# Patient Record
Sex: Male | Born: 1945 | Race: Black or African American | Hispanic: No | Marital: Single | Smoking: Never smoker
Health system: Southern US, Community
[De-identification: ages and names within clinical notes are randomized; demographics above are authoritative.]

## PROBLEM LIST (undated history)

## (undated) DIAGNOSIS — I482 Chronic atrial fibrillation, unspecified: Secondary | ICD-10-CM

## (undated) DIAGNOSIS — F419 Anxiety disorder, unspecified: Secondary | ICD-10-CM

## (undated) DIAGNOSIS — J9621 Acute and chronic respiratory failure with hypoxia: Secondary | ICD-10-CM

## (undated) DIAGNOSIS — J189 Pneumonia, unspecified organism: Secondary | ICD-10-CM

## (undated) DIAGNOSIS — U071 COVID-19: Secondary | ICD-10-CM

## (undated) DIAGNOSIS — F329 Major depressive disorder, single episode, unspecified: Secondary | ICD-10-CM

## (undated) DIAGNOSIS — F259 Schizoaffective disorder, unspecified: Secondary | ICD-10-CM

## (undated) DIAGNOSIS — N189 Chronic kidney disease, unspecified: Secondary | ICD-10-CM

## (undated) DIAGNOSIS — F32A Depression, unspecified: Secondary | ICD-10-CM

## (undated) DIAGNOSIS — R0602 Shortness of breath: Secondary | ICD-10-CM

## (undated) DIAGNOSIS — N281 Cyst of kidney, acquired: Secondary | ICD-10-CM

## (undated) DIAGNOSIS — E039 Hypothyroidism, unspecified: Secondary | ICD-10-CM

## (undated) DIAGNOSIS — F250 Schizoaffective disorder, bipolar type: Secondary | ICD-10-CM

## (undated) DIAGNOSIS — M1990 Unspecified osteoarthritis, unspecified site: Secondary | ICD-10-CM

## (undated) DIAGNOSIS — G2401 Drug induced subacute dyskinesia: Secondary | ICD-10-CM

## (undated) DIAGNOSIS — R6520 Severe sepsis without septic shock: Secondary | ICD-10-CM

## (undated) DIAGNOSIS — I509 Heart failure, unspecified: Secondary | ICD-10-CM

## (undated) DIAGNOSIS — I10 Essential (primary) hypertension: Secondary | ICD-10-CM

## (undated) DIAGNOSIS — A419 Sepsis, unspecified organism: Secondary | ICD-10-CM

## (undated) DIAGNOSIS — R569 Unspecified convulsions: Secondary | ICD-10-CM

## (undated) DIAGNOSIS — G9341 Metabolic encephalopathy: Secondary | ICD-10-CM

## (undated) DIAGNOSIS — F99 Mental disorder, not otherwise specified: Secondary | ICD-10-CM

## (undated) DIAGNOSIS — E119 Type 2 diabetes mellitus without complications: Secondary | ICD-10-CM

## (undated) HISTORY — PX: HERNIA REPAIR: SHX51

## (undated) HISTORY — PX: WRIST ARTHROPLASTY: SHX1088

## (undated) HISTORY — PX: ANKLE ARTHROSCOPY: SUR85

## (undated) HISTORY — PX: COLONOSCOPY: SHX174

---

## 2007-08-31 ENCOUNTER — Emergency Department (HOSPITAL_COMMUNITY): Admission: EM | Admit: 2007-08-31 | Discharge: 2007-08-31 | Payer: Self-pay | Admitting: Emergency Medicine

## 2007-09-01 ENCOUNTER — Emergency Department (HOSPITAL_COMMUNITY): Admission: EM | Admit: 2007-09-01 | Discharge: 2007-09-02 | Payer: Self-pay | Admitting: Emergency Medicine

## 2010-11-04 LAB — COMPREHENSIVE METABOLIC PANEL
AST: 25
Alkaline Phosphatase: 104
BUN: 14
Creatinine, Ser: 1.71 — ABNORMAL HIGH
GFR calc Af Amer: 49 — ABNORMAL LOW
GFR calc non Af Amer: 41 — ABNORMAL LOW
Glucose, Bld: 117 — ABNORMAL HIGH
Sodium: 141
Total Bilirubin: 1.1

## 2010-11-04 LAB — CBC
MCHC: 34.1
Platelets: 201
RDW: 14.6

## 2010-11-04 LAB — DIFFERENTIAL
Basophils Absolute: 0
Basophils Relative: 1
Monocytes Absolute: 0.3
Neutro Abs: 3.3
Neutrophils Relative %: 57

## 2010-11-04 LAB — RAPID URINE DRUG SCREEN, HOSP PERFORMED
Amphetamines: NOT DETECTED
Barbiturates: NOT DETECTED
Benzodiazepines: NOT DETECTED
Opiates: NOT DETECTED
Tetrahydrocannabinol: NOT DETECTED

## 2010-11-04 LAB — GLUCOSE, CAPILLARY: Glucose-Capillary: 75

## 2010-11-04 LAB — ETHANOL: Alcohol, Ethyl (B): <5

## 2010-11-04 LAB — TSH: TSH: 0.886

## 2012-06-20 HISTORY — PX: TRANSTHORACIC ECHOCARDIOGRAM: SHX275

## 2012-08-06 HISTORY — PX: COLON RESECTION: SHX5231

## 2012-10-03 ENCOUNTER — Other Ambulatory Visit: Payer: Self-pay | Admitting: Neurosurgery

## 2012-10-22 NOTE — Pre-Procedure Instructions (Addendum)
Henry Mcbride  10/22/2012   Your procedure is scheduled on: Sept 22 at 0930  Report to [facility] Stay Center at 0730 AM.  Call this number if you have problems the morning of surgery: [phone]   Remember:   Do not eat food or drink liquids after midnight.   Take these medicines the morning of surgery with A SIP OF WATER: Coreg, Avodart, Synthroid, Ditropan,  Flomax.  Stop taking Aspirin, Aleve, BC's, Goody's, Ibuprofen, Herbal medications, and Fish Oil.   Do not wear jewelry, make-up or nail polish.  Do not wear lotions, powders, or perfumes. You may wear deodorant.  Do not shave 48 hours prior to surgery. Men may shave face and neck.  Do not bring valuables to the hospital.  [facility] is not responsible                   for any belongings or valuables.  Contacts, dentures or bridgework may not be worn into surgery.  Leave suitcase in the car. After surgery it may be brought to your room.  For patients admitted to the hospital, checkout time is 11:00 AM the day of  discharge.   Patients discharged the day of surgery will not be allowed to drive  home.    Special Instructions: Shower using CHG 2 nights before surgery and the night before surgery.  If you shower the day of surgery use CHG.  Use special wash - you have one bottle of CHG for all showers.  You should use approximately 1/3 of the bottle for each shower.   Please read over the following fact sheets that you were given: Pain Booklet, Coughing and Deep Breathing, Blood Transfusion Information, MRSA Information and Surgical Site Infection Prevention

## 2012-10-23 ENCOUNTER — Telehealth (INDEPENDENT_AMBULATORY_CARE_PROVIDER_SITE_OTHER): Payer: Self-pay | Admitting: General Surgery

## 2012-10-23 ENCOUNTER — Encounter (HOSPITAL_COMMUNITY)
Admission: RE | Admit: 2012-10-23 | Discharge: 2012-10-23 | Disposition: A | Discharge status: Home / Self Care | Payer: Medicare Other | Source: Ambulatory Visit | Attending: Neurosurgery | Admitting: Neurosurgery

## 2012-10-23 ENCOUNTER — Encounter (HOSPITAL_COMMUNITY): Payer: Self-pay | Admitting: Pharmacy Technician

## 2012-10-23 ENCOUNTER — Encounter (HOSPITAL_COMMUNITY): Payer: Self-pay

## 2012-10-23 DIAGNOSIS — Z01812 Encounter for preprocedural laboratory examination: Secondary | ICD-10-CM | POA: Insufficient documentation

## 2012-10-23 DIAGNOSIS — Z01818 Encounter for other preprocedural examination: Secondary | ICD-10-CM | POA: Insufficient documentation

## 2012-10-23 HISTORY — DX: Heart failure, unspecified: I50.9

## 2012-10-23 HISTORY — DX: Major depressive disorder, single episode, unspecified: F32.9

## 2012-10-23 HISTORY — DX: Mental disorder, not otherwise specified: F99

## 2012-10-23 HISTORY — DX: Essential (primary) hypertension: I10

## 2012-10-23 HISTORY — DX: Cyst of kidney, acquired: N28.1

## 2012-10-23 HISTORY — DX: Hypothyroidism, unspecified: E03.9

## 2012-10-23 HISTORY — DX: Schizoaffective disorder, bipolar type: F25.0

## 2012-10-23 HISTORY — DX: Unspecified convulsions: R56.9

## 2012-10-23 HISTORY — DX: Depression, unspecified: F32.A

## 2012-10-23 HISTORY — DX: Shortness of breath: R06.02

## 2012-10-23 HISTORY — DX: Anxiety disorder, unspecified: F41.9

## 2012-10-23 HISTORY — DX: Chronic kidney disease, unspecified: N18.9

## 2012-10-23 HISTORY — DX: Unspecified osteoarthritis, unspecified site: M19.90

## 2012-10-23 HISTORY — DX: Drug induced subacute dyskinesia: G24.01

## 2012-10-23 HISTORY — DX: Type 2 diabetes mellitus without complications: E11.9

## 2012-10-23 HISTORY — DX: Schizoaffective disorder, unspecified: F25.9

## 2012-10-23 LAB — BASIC METABOLIC PANEL WITH GFR
BUN: 24 mg/dL — ABNORMAL HIGH (ref 6–23)
CO2: 28 meq/L (ref 19–32)
Calcium: 9.6 mg/dL (ref 8.4–10.5)
Chloride: 100 meq/L (ref 96–112)
Creatinine, Ser: 1.71 mg/dL — ABNORMAL HIGH (ref 0.50–1.35)
GFR calc Af Amer: 46 mL/min — ABNORMAL LOW
GFR calc non Af Amer: 40 mL/min — ABNORMAL LOW
Glucose, Bld: 81 mg/dL (ref 70–99)
Potassium: 3.9 meq/L (ref 3.5–5.1)
Sodium: 137 meq/L (ref 135–145)

## 2012-10-23 LAB — CBC WITH DIFFERENTIAL/PLATELET
Basophils Relative: 0 % (ref 0–1)
Eosinophils Relative: 3 % (ref 0–5)
HCT: 38.7 % — ABNORMAL LOW (ref 39.0–52.0)
Hemoglobin: 12.9 g/dL — ABNORMAL LOW (ref 13.0–17.0)
Lymphocytes Relative: 47 % — ABNORMAL HIGH (ref 12–46)
MCHC: 33.3 g/dL (ref 30.0–36.0)
MCV: 96.3 fL (ref 78.0–100.0)
Monocytes Absolute: 0.6 10*3/uL (ref 0.1–1.0)
Monocytes Relative: 9 % (ref 3–12)
Neutro Abs: 2.5 10*3/uL (ref 1.7–7.7)

## 2012-10-23 LAB — TYPE AND SCREEN
ABO/RH(D): A POS
Antibody Screen: NEGATIVE

## 2012-10-23 LAB — SURGICAL PCR SCREEN
MRSA, PCR: POSITIVE — AB
Staphylococcus aureus: POSITIVE — AB

## 2012-10-23 NOTE — Progress Notes (Signed)
Pt from [facility].  He would like to return there after surgery, if able. Group home  Rep states that he can not be d/c on Sat. or Sun.   Pt states that he was in the [facility] in July 2014, and had a chest xray and ekg there. Request faxed.  Denies seeing a Cardiologist.  Unsure if he has had a card cath, Stress test, or Echo, but if he has it would have been at [facility].  Fax sent to request these test too.

## 2012-10-23 NOTE — Progress Notes (Signed)
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 Message left for Misty at [facility] group home to  Call me back.

## 2012-10-23 NOTE — Progress Notes (Signed)
Spoke with Misty at [facility] and informed her of the results of the pcr screen and that Dr Jordan Likes would be calling in a script for Mupirocin.  Voices understanding.

## 2012-10-23 NOTE — Progress Notes (Signed)
Received reports from [facility] chest xray was from 2013, and No EKG noted.  New order place for cxr and EKG the day of surgery. Echo in chart from 01-08-11.

## 2012-10-23 NOTE — Telephone Encounter (Signed)
Pt called stating his percocet wasn't controlling his pain.  He felt like tramadol worked better and had a few left.  i told him it was ok to take tramadol instead on percocet.  He will call the office in the am, if he is still having trouble with pain control.

## 2012-10-24 NOTE — Progress Notes (Addendum)
Anesthesia chart review: Patient is a 67 year old male scheduled for C2-3, C3-4, C4-5, C5-6, C6-7 posterior cervical fusion on 10/28/2012 by Dr. Jordan Likes. History includes nonsmoker, hypertension, hypothyroidism, anxiety, schizoaffective schizophrenia, depression, CHF, diabetes mellitus type 2, seizures, arthritis, renal cysts, CKD, seizures (last 3-4 years ago), tardive dyskinesia, laparoscopic right colon resection for cecal mass and umbilical hernia repair 08/07/12 (Dr. Dimas Chyle, [facility]). He lives at [facility]. PCP is Dr. Reola Calkins, NP with [facility] in [place].  He reported to his PAT RN that he had a CXR and EKG at [facility]) during his admission there for colon resection. However, [facility] did not have a record of an EKG and CXR was done in 08/2011, so these will need to be done on the day of surgery.  His prior EKG at [facility] on 08/31/07 showed NSR with inferolateral T wave abnormality, consider ischemia.    Echo on 01/08/11 ([facility]; done in the setting of [facility]) showed left ventricular dysfunction, mild with ejection fraction calculated 48% but visually estimated 40-45% with akinesia in the basilar and mid inferior lateral wall, diastolic dysfunction with impaired relaxation and normal diastolic pressure, mild aortic, mitral, and tricuspid regurgitation.  Preoperative labs noted.  Cr 1.71 which is unchanged since 08/31/07.  He has a history of an abnormal EKG and echo.  He denied seeing a cardiologist. He did tolerated colon resection just two months ago.  I have requested additional records from [facility] and will review once available.  Velna Ochs [facility] Stay [facility]/Anesthesiology Phone [phone] 10/24/2012 4:49 PM  Addendum: 10/25/2012 3:30 PM Received records from Dr. Ardelle Park that indicated that patient had recent cardiology evaluation at [facility]) in [place].  Cardiology records received from 07/10/12.  He was  seen there for clearance prior to his colon resection.  EKG 07/10/12 showed SR, lateral ST/T wave abnormality (negative T wave).  Records indicate that he had a previous work-up at [facility] for non-ischemic dilated cardiomyopathy with his EF improving to 45% and had been stable.    Echo on 06/20/12 [facility]) showed: Moderately dilated left ventricle, mild to moderate segmental systolic dysfunction with akinesia of the basilar and mid inferolateral segments. EF 45% (visually estimated 40%). Abnormal diastolic function with impaired relaxation. Mildly dilated left atrium. Trivial aortic regurgitation. Trivial mitral regurgitation.    Cardiac cath on 10/04/07 [facility]) showed EF 33%, severe anterior, apex, and inferior hypokinesis, 20% D2, 15% mid RCA, no LM, LAD, or CX stenosis. Low filling pressures.  Cardiac output is 5.34 L/min and CI is 2.7 L/min/m2.  Patient had cardiology evaluation earlier this year and has since tolerated a colon resection.  If no acute changes then I would anticipate that he could proceed as planned.

## 2012-10-25 ENCOUNTER — Encounter (HOSPITAL_COMMUNITY): Payer: Self-pay

## 2012-10-27 MED ORDER — CEFAZOLIN SODIUM-DEXTROSE 2-3 GM-% IV SOLR
2.0000 g | INTRAVENOUS | Status: AC
  Administered 2012-10-28: 2 g via INTRAVENOUS
  Filled 2012-10-27: qty 50

## 2012-10-28 ENCOUNTER — Inpatient Hospital Stay (HOSPITAL_COMMUNITY): Payer: Medicare Other

## 2012-10-28 ENCOUNTER — Encounter (HOSPITAL_COMMUNITY): Payer: Self-pay | Admitting: Vascular Surgery

## 2012-10-28 ENCOUNTER — Inpatient Hospital Stay (HOSPITAL_COMMUNITY)
Admission: RE | Admit: 2012-10-28 | Discharge: 2012-11-01 | DRG: 472 | Disposition: A | Discharge status: Home Health | Payer: Medicare Other | Source: Ambulatory Visit | Attending: Neurosurgery | Admitting: Neurosurgery

## 2012-10-28 ENCOUNTER — Encounter (HOSPITAL_COMMUNITY)
Admission: RE | Disposition: A | Discharge status: Home Health | Payer: Self-pay | Source: Ambulatory Visit | Attending: Neurosurgery

## 2012-10-28 ENCOUNTER — Encounter (HOSPITAL_COMMUNITY): Payer: Self-pay | Admitting: *Deleted

## 2012-10-28 ENCOUNTER — Inpatient Hospital Stay (HOSPITAL_COMMUNITY): Payer: Medicare Other | Admitting: Anesthesiology

## 2012-10-28 DIAGNOSIS — F3289 Other specified depressive episodes: Secondary | ICD-10-CM | POA: Diagnosis present

## 2012-10-28 DIAGNOSIS — N189 Chronic kidney disease, unspecified: Secondary | ICD-10-CM | POA: Diagnosis present

## 2012-10-28 DIAGNOSIS — M4712 Other spondylosis with myelopathy, cervical region: Principal | ICD-10-CM | POA: Diagnosis present

## 2012-10-28 DIAGNOSIS — M40209 Unspecified kyphosis, site unspecified: Secondary | ICD-10-CM | POA: Diagnosis present

## 2012-10-28 DIAGNOSIS — E119 Type 2 diabetes mellitus without complications: Secondary | ICD-10-CM | POA: Diagnosis present

## 2012-10-28 DIAGNOSIS — I129 Hypertensive chronic kidney disease with stage 1 through stage 4 chronic kidney disease, or unspecified chronic kidney disease: Secondary | ICD-10-CM | POA: Diagnosis present

## 2012-10-28 DIAGNOSIS — M129 Arthropathy, unspecified: Secondary | ICD-10-CM | POA: Diagnosis present

## 2012-10-28 DIAGNOSIS — E039 Hypothyroidism, unspecified: Secondary | ICD-10-CM | POA: Diagnosis present

## 2012-10-28 DIAGNOSIS — F411 Generalized anxiety disorder: Secondary | ICD-10-CM | POA: Diagnosis present

## 2012-10-28 DIAGNOSIS — M4000 Postural kyphosis, site unspecified: Secondary | ICD-10-CM | POA: Diagnosis present

## 2012-10-28 DIAGNOSIS — M4802 Spinal stenosis, cervical region: Secondary | ICD-10-CM

## 2012-10-28 DIAGNOSIS — F259 Schizoaffective disorder, unspecified: Secondary | ICD-10-CM | POA: Diagnosis present

## 2012-10-28 DIAGNOSIS — F329 Major depressive disorder, single episode, unspecified: Secondary | ICD-10-CM | POA: Diagnosis present

## 2012-10-28 DIAGNOSIS — I428 Other cardiomyopathies: Secondary | ICD-10-CM | POA: Diagnosis present

## 2012-10-28 HISTORY — PX: POSTERIOR CERVICAL FUSION/FORAMINOTOMY: SHX5038

## 2012-10-28 LAB — GLUCOSE, CAPILLARY
Glucose-Capillary: 88 mg/dL (ref 70–99)
Glucose-Capillary: 148 mg/dL — ABNORMAL HIGH (ref 70–99)
Glucose-Capillary: 144 mg/dL — ABNORMAL HIGH (ref 70–99)

## 2012-10-28 SURGERY — POSTERIOR CERVICAL FUSION/FORAMINOTOMY LEVEL 5
Anesthesia: General | Site: Neck | Wound class: Clean

## 2012-10-28 MED ORDER — HEMOSTATIC AGENTS (NO CHARGE) OPTIME
TOPICAL | Status: DC | PRN
  Administered 2012-10-28: 1 via TOPICAL

## 2012-10-28 MED ORDER — DUTASTERIDE 0.5 MG PO CAPS
0.5000 mg | ORAL_CAPSULE | Freq: Every day | ORAL | Status: DC
  Administered 2012-10-29 – 2012-11-01 (×4): 0.5 mg via ORAL
  Filled 2012-10-28 (×4): qty 1

## 2012-10-28 MED ORDER — POLYETHYLENE GLYCOL 3350 17 GM/SCOOP PO POWD
17.0000 g | Freq: Every day | ORAL | Status: DC
  Administered 2012-10-28: 17 g via ORAL
  Filled 2012-10-28 (×2): qty 255

## 2012-10-28 MED ORDER — PHENYLEPHRINE HCL 10 MG/ML IJ SOLN
INTRAMUSCULAR | Status: DC | PRN
  Administered 2012-10-28: 80 ug via INTRAVENOUS
  Administered 2012-10-28: 160 ug via INTRAVENOUS

## 2012-10-28 MED ORDER — ARTIFICIAL TEARS OP OINT
TOPICAL_OINTMENT | OPHTHALMIC | Status: DC | PRN
  Administered 2012-10-28: 1 via OPHTHALMIC

## 2012-10-28 MED ORDER — HYDROCODONE-ACETAMINOPHEN 5-325 MG PO TABS
1.0000 | ORAL_TABLET | ORAL | Status: DC | PRN
  Administered 2012-10-30 – 2012-11-01 (×2): 2 via ORAL
  Filled 2012-10-28 (×2): qty 2

## 2012-10-28 MED ORDER — ONDANSETRON HCL 4 MG/2ML IJ SOLN
INTRAMUSCULAR | Status: DC | PRN
  Administered 2012-10-28: 4 mg via INTRAVENOUS

## 2012-10-28 MED ORDER — FENTANYL CITRATE 0.05 MG/ML IJ SOLN
INTRAMUSCULAR | Status: DC | PRN
  Administered 2012-10-28: 100 ug via INTRAVENOUS

## 2012-10-28 MED ORDER — LACTATED RINGERS IV SOLN
INTRAVENOUS | Status: DC | PRN
  Administered 2012-10-28: 09:00:00 via INTRAVENOUS

## 2012-10-28 MED ORDER — ROCURONIUM BROMIDE 100 MG/10ML IV SOLN
INTRAVENOUS | Status: DC | PRN
  Administered 2012-10-28: 50 mg via INTRAVENOUS

## 2012-10-28 MED ORDER — 0.9 % SODIUM CHLORIDE (POUR BTL) OPTIME
TOPICAL | Status: DC | PRN
  Administered 2012-10-28: 1000 mL

## 2012-10-28 MED ORDER — NEOSTIGMINE METHYLSULFATE 1 MG/ML IJ SOLN
INTRAMUSCULAR | Status: DC | PRN
  Administered 2012-10-28: 4 mg via INTRAVENOUS
  Administered 2012-10-28: 1 mg via INTRAVENOUS

## 2012-10-28 MED ORDER — SODIUM CHLORIDE 0.9 % IV SOLN
250.0000 mL | INTRAVENOUS | Status: DC
  Administered 2012-10-28: 250 mL via INTRAVENOUS

## 2012-10-28 MED ORDER — PROPOFOL 10 MG/ML IV BOLUS
INTRAVENOUS | Status: DC | PRN
  Administered 2012-10-28: 25 mg via INTRAVENOUS
  Administered 2012-10-28: 175 mg via INTRAVENOUS

## 2012-10-28 MED ORDER — LEVOTHYROXINE SODIUM 137 MCG PO TABS
137.0000 ug | ORAL_TABLET | Freq: Every day | ORAL | Status: DC
  Administered 2012-10-29 – 2012-11-01 (×4): 137 ug via ORAL
  Filled 2012-10-28 (×5): qty 1

## 2012-10-28 MED ORDER — PHENOL 1.4 % MT LIQD: 1.0000 | OROMUCOSAL | Status: DC | PRN

## 2012-10-28 MED ORDER — SODIUM CHLORIDE 0.9 % IJ SOLN
3.0000 mL | INTRAMUSCULAR | Status: DC | PRN
  Administered 2012-10-30: 3 mL via INTRAVENOUS

## 2012-10-28 MED ORDER — FUROSEMIDE 20 MG PO TABS
20.0000 mg | ORAL_TABLET | Freq: Every day | ORAL | Status: DC
  Administered 2012-10-28 – 2012-11-01 (×5): 20 mg via ORAL
  Filled 2012-10-28 (×5): qty 1

## 2012-10-28 MED ORDER — TAMSULOSIN HCL 0.4 MG PO CAPS
0.4000 mg | ORAL_CAPSULE | Freq: Two times a day (BID) | ORAL | Status: DC
  Administered 2012-10-28 – 2012-11-01 (×8): 0.4 mg via ORAL
  Filled 2012-10-28 (×9): qty 1

## 2012-10-28 MED ORDER — BUPIVACAINE HCL (PF) 0.25 % IJ SOLN
INTRAMUSCULAR | Status: DC | PRN
  Administered 2012-10-28: 10 mL

## 2012-10-28 MED ORDER — SENNA 8.6 MG PO TABS
1.0000 | ORAL_TABLET | Freq: Two times a day (BID) | ORAL | Status: DC
  Administered 2012-10-28 – 2012-11-01 (×8): 8.6 mg via ORAL
  Filled 2012-10-28 (×9): qty 1

## 2012-10-28 MED ORDER — LACTATED RINGERS IV SOLN: Freq: Once | INTRAVENOUS | Status: DC

## 2012-10-28 MED ORDER — VECURONIUM BROMIDE 10 MG IV SOLR
INTRAVENOUS | Status: DC | PRN
  Administered 2012-10-28: 3 mg via INTRAVENOUS
  Administered 2012-10-28: 2 mg via INTRAVENOUS
  Administered 2012-10-28: 3 mg via INTRAVENOUS

## 2012-10-28 MED ORDER — POTASSIUM CHLORIDE 20 MEQ/15ML (10%) PO LIQD
10.0000 meq | Freq: Every day | ORAL | Status: DC
  Administered 2012-10-28 – 2012-11-01 (×5): 10 meq via ORAL
  Filled 2012-10-28 (×6): qty 7.5

## 2012-10-28 MED ORDER — EPHEDRINE SULFATE 50 MG/ML IJ SOLN
INTRAMUSCULAR | Status: DC | PRN
  Administered 2012-10-28 (×2): 10 mg via INTRAVENOUS
  Administered 2012-10-28: 15 mg via INTRAVENOUS

## 2012-10-28 MED ORDER — SODIUM CHLORIDE 0.9 % IV SOLN
10.0000 mg | INTRAVENOUS | Status: DC | PRN
  Administered 2012-10-28: 10 ug/min via INTRAVENOUS

## 2012-10-28 MED ORDER — LINAGLIPTIN 5 MG PO TABS
5.0000 mg | ORAL_TABLET | Freq: Every day | ORAL | Status: DC
  Administered 2012-10-28 – 2012-11-01 (×5): 5 mg via ORAL
  Filled 2012-10-28 (×5): qty 1

## 2012-10-28 MED ORDER — HYDROMORPHONE HCL PF 1 MG/ML IJ SOLN
INTRAMUSCULAR | Status: AC
  Filled 2012-10-28: qty 1

## 2012-10-28 MED ORDER — ONDANSETRON HCL 4 MG/2ML IJ SOLN: 4.0000 mg | INTRAMUSCULAR | Status: DC | PRN

## 2012-10-28 MED ORDER — LIDOCAINE HCL (CARDIAC) 20 MG/ML IV SOLN
INTRAVENOUS | Status: DC | PRN
  Administered 2012-10-28: 100 mg via INTRAVENOUS

## 2012-10-28 MED ORDER — THROMBIN 20000 UNITS EX SOLR
CUTANEOUS | Status: DC | PRN
  Administered 2012-10-28: 11:00:00 via TOPICAL

## 2012-10-28 MED ORDER — MENTHOL 3 MG MT LOZG: 1.0000 | LOZENGE | OROMUCOSAL | Status: DC | PRN

## 2012-10-28 MED ORDER — ONDANSETRON HCL 4 MG/2ML IJ SOLN: 4.0000 mg | Freq: Four times a day (QID) | INTRAMUSCULAR | Status: DC | PRN

## 2012-10-28 MED ORDER — MIDAZOLAM HCL 5 MG/5ML IJ SOLN
INTRAMUSCULAR | Status: DC | PRN
  Administered 2012-10-28: 2 mg via INTRAVENOUS

## 2012-10-28 MED ORDER — POTASSIUM CHLORIDE 40 MEQ/15ML (20%) PO LIQD: 10.0000 meq | Freq: Every day | ORAL | Status: DC

## 2012-10-28 MED ORDER — CYCLOBENZAPRINE HCL 10 MG PO TABS
10.0000 mg | ORAL_TABLET | Freq: Three times a day (TID) | ORAL | Status: DC | PRN
  Administered 2012-10-28 – 2012-10-31 (×3): 10 mg via ORAL
  Filled 2012-10-28 (×3): qty 1

## 2012-10-28 MED ORDER — SUCCINYLCHOLINE CHLORIDE 20 MG/ML IJ SOLN
INTRAMUSCULAR | Status: DC | PRN
  Administered 2012-10-28: 120 mg via INTRAVENOUS

## 2012-10-28 MED ORDER — ACETAMINOPHEN 650 MG RE SUPP: 650.0000 mg | RECTAL | Status: DC | PRN

## 2012-10-28 MED ORDER — ALUM & MAG HYDROXIDE-SIMETH 200-200-20 MG/5ML PO SUSP: 30.0000 mL | Freq: Four times a day (QID) | ORAL | Status: DC | PRN

## 2012-10-28 MED ORDER — OXYCODONE HCL 5 MG PO TABS: 5.0000 mg | ORAL_TABLET | Freq: Once | ORAL | Status: DC | PRN

## 2012-10-28 MED ORDER — DONEPEZIL HCL 10 MG PO TABS
10.0000 mg | ORAL_TABLET | Freq: Every day | ORAL | Status: DC
  Administered 2012-10-28 – 2012-10-31 (×4): 10 mg via ORAL
  Filled 2012-10-28 (×5): qty 1

## 2012-10-28 MED ORDER — OLANZAPINE 10 MG PO TBDP
10.0000 mg | ORAL_TABLET | Freq: Every day | ORAL | Status: DC
  Administered 2012-10-28 – 2012-10-31 (×4): 10 mg via ORAL
  Filled 2012-10-28 (×6): qty 1

## 2012-10-28 MED ORDER — OXYCODONE-ACETAMINOPHEN 5-325 MG PO TABS
1.0000 | ORAL_TABLET | ORAL | Status: DC | PRN
  Administered 2012-10-28 – 2012-10-31 (×8): 2 via ORAL
  Filled 2012-10-28 (×8): qty 2

## 2012-10-28 MED ORDER — OXYCODONE HCL 5 MG/5ML PO SOLN: 5.0000 mg | Freq: Once | ORAL | Status: DC | PRN

## 2012-10-28 MED ORDER — SODIUM CHLORIDE 0.9 % IJ SOLN
3.0000 mL | Freq: Two times a day (BID) | INTRAMUSCULAR | Status: DC
  Administered 2012-10-28 – 2012-11-01 (×8): 3 mL via INTRAVENOUS

## 2012-10-28 MED ORDER — HYDROMORPHONE HCL PF 1 MG/ML IJ SOLN
0.5000 mg | INTRAMUSCULAR | Status: DC | PRN
  Administered 2012-10-28 – 2012-10-29 (×4): 1 mg via INTRAVENOUS
  Filled 2012-10-28 (×4): qty 1

## 2012-10-28 MED ORDER — BENZTROPINE MESYLATE 2 MG PO TABS
2.0000 mg | ORAL_TABLET | Freq: Two times a day (BID) | ORAL | Status: DC
  Administered 2012-10-28 – 2012-11-01 (×8): 2 mg via ORAL
  Filled 2012-10-28 (×9): qty 1

## 2012-10-28 MED ORDER — DEXAMETHASONE SODIUM PHOSPHATE 10 MG/ML IJ SOLN
10.0000 mg | INTRAMUSCULAR | Status: AC
  Administered 2012-10-28: 10 mg via INTRAVENOUS
  Filled 2012-10-28: qty 1

## 2012-10-28 MED ORDER — GLYCOPYRROLATE 0.2 MG/ML IJ SOLN
INTRAMUSCULAR | Status: DC | PRN
  Administered 2012-10-28: 0.2 mg via INTRAVENOUS
  Administered 2012-10-28: .8 mg via INTRAVENOUS

## 2012-10-28 MED ORDER — ALBUMIN HUMAN 5 % IV SOLN
INTRAVENOUS | Status: DC | PRN
  Administered 2012-10-28: 12:00:00 via INTRAVENOUS

## 2012-10-28 MED ORDER — OXYBUTYNIN CHLORIDE 5 MG PO TABS
5.0000 mg | ORAL_TABLET | Freq: Two times a day (BID) | ORAL | Status: DC
  Administered 2012-10-28 – 2012-11-01 (×8): 5 mg via ORAL
  Filled 2012-10-28 (×10): qty 1

## 2012-10-28 MED ORDER — HYDROMORPHONE HCL PF 1 MG/ML IJ SOLN
0.2500 mg | INTRAMUSCULAR | Status: DC | PRN
  Administered 2012-10-28 (×4): 0.5 mg via INTRAVENOUS

## 2012-10-28 MED ORDER — ACETAMINOPHEN 325 MG PO TABS: 650.0000 mg | ORAL_TABLET | ORAL | Status: DC | PRN

## 2012-10-28 MED ORDER — DIVALPROEX SODIUM ER 500 MG PO TB24
1750.0000 mg | ORAL_TABLET | Freq: Every evening | ORAL | Status: DC
  Administered 2012-10-28 – 2012-10-31 (×4): 1750 mg via ORAL
  Filled 2012-10-28 (×5): qty 1

## 2012-10-28 MED ORDER — CARVEDILOL 6.25 MG PO TABS
6.2500 mg | ORAL_TABLET | Freq: Two times a day (BID) | ORAL | Status: DC
  Administered 2012-10-28 – 2012-11-01 (×8): 6.25 mg via ORAL
  Filled 2012-10-28 (×11): qty 1

## 2012-10-28 MED ORDER — BACITRACIN ZINC 500 UNIT/GM EX OINT
TOPICAL_OINTMENT | CUTANEOUS | Status: DC | PRN
  Administered 2012-10-28: 1 via TOPICAL

## 2012-10-28 MED ORDER — INSULIN ASPART 100 UNIT/ML ~~LOC~~ SOLN
0.0000 [IU] | Freq: Three times a day (TID) | SUBCUTANEOUS | Status: DC
  Administered 2012-10-28 – 2012-10-29 (×3): 2 [IU] via SUBCUTANEOUS
  Administered 2012-10-30: 3 [IU] via SUBCUTANEOUS
  Administered 2012-10-30: 2 [IU] via SUBCUTANEOUS
  Administered 2012-10-30 – 2012-10-31 (×3): 3 [IU] via SUBCUTANEOUS

## 2012-10-28 MED ORDER — CEFAZOLIN SODIUM 1-5 GM-% IV SOLN
1.0000 g | Freq: Three times a day (TID) | INTRAVENOUS | Status: AC
  Administered 2012-10-28 – 2012-10-29 (×2): 1 g via INTRAVENOUS
  Filled 2012-10-28 (×2): qty 50

## 2012-10-28 MED ORDER — THROMBIN 5000 UNITS EX SOLR
CUTANEOUS | Status: DC | PRN
  Administered 2012-10-28 (×2): 5000 [IU] via TOPICAL

## 2012-10-28 SURGICAL SUPPLY — 66 items
BAG DECANTER FOR FLEXI CONT (MISCELLANEOUS) ×2 IMPLANT
BENZOIN TINCTURE PRP APPL 2/3 (GAUZE/BANDAGES/DRESSINGS) ×2 IMPLANT
BIT DRILL 2.4X (BIT) ×1 IMPLANT
BIT DRL 2.4X (BIT) ×1
BLADE SURG ROTATE 9660 (MISCELLANEOUS) IMPLANT
BRUSH SCRUB EZ PLAIN DRY (MISCELLANEOUS) ×2 IMPLANT
BUR MATCHSTICK NEURO 3.0 LAGG (BURR) ×4 IMPLANT
CANISTER SUCTION 2500CC (MISCELLANEOUS) ×2 IMPLANT
CLOTH BEACON ORANGE TIMEOUT ST (SAFETY) ×2 IMPLANT
CONT SPEC 4OZ CLIKSEAL STRL BL (MISCELLANEOUS) ×4 IMPLANT
DERMABOND ADHESIVE PROPEN (GAUZE/BANDAGES/DRESSINGS) ×1
DERMABOND ADVANCED (GAUZE/BANDAGES/DRESSINGS) ×1
DERMABOND ADVANCED .7 DNX12 (GAUZE/BANDAGES/DRESSINGS) ×1 IMPLANT
DERMABOND ADVANCED .7 DNX6 (GAUZE/BANDAGES/DRESSINGS) ×1 IMPLANT
DRAPE C-ARM 42X72 X-RAY (DRAPES) ×4 IMPLANT
DRAPE LAPAROTOMY 100X72 PEDS (DRAPES) ×2 IMPLANT
DRAPE POUCH INSTRU U-SHP 10X18 (DRAPES) ×2 IMPLANT
DRILL BIT (BIT) ×1
DRSG OPSITE POSTOP 4X10 (GAUZE/BANDAGES/DRESSINGS) ×2 IMPLANT
DURAPREP 26ML APPLICATOR (WOUND CARE) ×2 IMPLANT
ELECT REM PT RETURN 9FT ADLT (ELECTROSURGICAL) ×2
ELECTRODE REM PT RTRN 9FT ADLT (ELECTROSURGICAL) ×1 IMPLANT
EVACUATOR 1/8 PVC DRAIN (DRAIN) IMPLANT
GAUZE SPONGE 4X4 16PLY XRAY LF (GAUZE/BANDAGES/DRESSINGS) IMPLANT
GLOVE BIO SURGEON STRL SZ8 (GLOVE) ×2 IMPLANT
GLOVE BIOGEL PI IND STRL 7.5 (GLOVE) ×1 IMPLANT
GLOVE BIOGEL PI INDICATOR 7.5 (GLOVE) ×1
GLOVE ECLIPSE 8.5 STRL (GLOVE) ×4 IMPLANT
GLOVE EXAM NITRILE LRG STRL (GLOVE) IMPLANT
GLOVE EXAM NITRILE MD LF STRL (GLOVE) IMPLANT
GLOVE EXAM NITRILE XL STR (GLOVE) ×2 IMPLANT
GLOVE EXAM NITRILE XS STR PU (GLOVE) IMPLANT
GLOVE INDICATOR 8.5 STRL (GLOVE) ×6 IMPLANT
GLOVE SURG SS PI 7.0 STRL IVOR (GLOVE) ×10 IMPLANT
GOWN BRE IMP SLV AUR LG STRL (GOWN DISPOSABLE) IMPLANT
GOWN BRE IMP SLV AUR XL STRL (GOWN DISPOSABLE) ×6 IMPLANT
GOWN STRL REIN 2XL LVL4 (GOWN DISPOSABLE) IMPLANT
KIT BASIN OR (CUSTOM PROCEDURE TRAY) ×2 IMPLANT
KIT ROOM TURNOVER OR (KITS) ×2 IMPLANT
MASTERGRAFT STRIP 10CM ×4 IMPLANT
MATRIX MASTERGRAFT STRIP 10CM ×2 IMPLANT
MILL MEDIUM DISP (BLADE) ×2 IMPLANT
NEEDLE HYPO 22GX1.5 SAFETY (NEEDLE) ×2 IMPLANT
NEEDLE SPNL 22GX3.5 QUINCKE BK (NEEDLE) ×2 IMPLANT
NS IRRIG 1000ML POUR BTL (IV SOLUTION) ×2 IMPLANT
PACK LAMINECTOMY NEURO (CUSTOM PROCEDURE TRAY) ×2 IMPLANT
PAD ARMBOARD 7.5X6 YLW CONV (MISCELLANEOUS) ×6 IMPLANT
PIN MAYFIELD SKULL DISP (PIN) ×2 IMPLANT
ROD VERTEX 100MM (Rod) ×2 IMPLANT
ROD VERTEX 90MM (Rod) ×2 IMPLANT
SCREW CANCELLOUS 3.5X14MM (Screw) ×20 IMPLANT
SCREW SET M6 (Screw) ×24 IMPLANT
SCREW VERTEX 3.5X18 MAX (Screw) ×4 IMPLANT
SPONGE GAUZE 4X4 12PLY (GAUZE/BANDAGES/DRESSINGS) ×2 IMPLANT
SPONGE LAP 4X18 X RAY DECT (DISPOSABLE) IMPLANT
SPONGE SURGIFOAM ABS GEL SZ50 (HEMOSTASIS) ×2 IMPLANT
STAPLER SKIN PROX WIDE 3.9 (STAPLE) ×2 IMPLANT
STRIP CLOSURE SKIN 1/2X4 (GAUZE/BANDAGES/DRESSINGS) ×2 IMPLANT
SUT VIC AB 0 CT1 18XCR BRD8 (SUTURE) ×2 IMPLANT
SUT VIC AB 0 CT1 8-18 (SUTURE) ×2
SUT VIC AB 2-0 CT1 18 (SUTURE) ×2 IMPLANT
SUT VIC AB 3-0 SH 8-18 (SUTURE) ×4 IMPLANT
SYR 20ML ECCENTRIC (SYRINGE) ×2 IMPLANT
TOWEL OR 17X24 6PK STRL BLUE (TOWEL DISPOSABLE) ×2 IMPLANT
TOWEL OR 17X26 10 PK STRL BLUE (TOWEL DISPOSABLE) ×2 IMPLANT
WATER STERILE IRR 1000ML POUR (IV SOLUTION) ×2 IMPLANT

## 2012-10-28 NOTE — Preoperative (Signed)
Beta Blockers   Reason not to administer Beta Blockers:Not Applicable 

## 2012-10-28 NOTE — Progress Notes (Signed)
UR COMPLETED  

## 2012-10-28 NOTE — Anesthesia Procedure Notes (Signed)
Procedure Name: Intubation Date/Time: 10/28/2012 7:42 AM Performed by: Carmela Rima Pre-anesthesia Checklist: Patient identified, Timeout performed, Emergency Drugs available, Suction available and Patient being monitored Patient Re-evaluated:Patient Re-evaluated prior to inductionOxygen Delivery Method: Circle system utilized Preoxygenation: Pre-oxygenation with 100% oxygen Intubation Type: IV induction and Rapid sequence Ventilation: Mask ventilation without difficulty Grade View: Grade III Tube type: Oral Tube size: 8.0 mm Number of attempts: 1 Airway Equipment and Method: Video-laryngoscopy Placement Confirmation: ETT inserted through vocal cords under direct vision,  positive ETCO2 and breath sounds checked- equal and bilateral Secured at: 23 cm Tube secured with: Tape Dental Injury: Teeth and Oropharynx as per pre-operative assessment

## 2012-10-28 NOTE — Anesthesia Preprocedure Evaluation (Addendum)
Anesthesia Evaluation  Patient identified by MRN, date of birth, ID band Patient awake    Reviewed: Allergy & Precautions, H&P , NPO status , Patient's Chart, lab work & pertinent test results  Airway Mallampati: II  Neck ROM: full    Dental  (+) Dental Advidsory Given   Pulmonary shortness of breath,          Cardiovascular hypertension, +CHF     Neuro/Psych Seizures -,  Anxiety Depression schizophrenia   GI/Hepatic   Endo/Other  diabetes, Type 2Hypothyroidism   Renal/GU Renal InsufficiencyRenal disease     Musculoskeletal   Abdominal   Peds  Hematology   Anesthesia Other Findings Fiber optic scope and Glide scope in room for intubation  Reproductive/Obstetrics                          Anesthesia Physical Anesthesia Plan  ASA: III  Anesthesia Plan: General   Post-op Pain Management:    Induction: Intravenous  Airway Management Planned: Oral ETT  Additional Equipment:   Intra-op Plan:   Post-operative Plan: Extubation in OR  Informed Consent: I have reviewed the patients History and Physical, chart, labs and discussed the procedure including the risks, benefits and alternatives for the proposed anesthesia with the patient or authorized representative who has indicated his/her understanding and acceptance.   Dental Advisory Given  Plan Discussed with: CRNA, Anesthesiologist and Surgeon  Anesthesia Plan Comments:        Anesthesia Quick Evaluation

## 2012-10-28 NOTE — H&P (Signed)
Henry Mcbride is an 67 y.o. male.   Chief Complaint: Neck pain HPI: The patient is a 67 year old male who has a chief complaint of severe neck pain. Workup also notes extreme flexion deformity of the cervical spine with patient essentially chin on chest. He states that his neck pain is somewhat better when he flexes like this. He has some intermittent numbness and paresthesias into his upper extremities. He denies any motor loss. Having no bowel or bladder dysfunction. His gait is become increasingly unstable however this is difficult to determine whether it secondary to his posture or elements of cervical myelopathy. 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 The patient is able to extend somewhat particularly when given gentle traction. With these maneuvers the patient is able to resume her recently normal posture without any evidence of radiating pain or myelopathy. Workup demonstrates the aforementioned stenosis and the kyphotic angulation worse at C4-5 and 6. Patient presents now for posterior cervical decompression from C2-C 5 with posterior cervical fusion from C2-C7 utilizing segmental lateral mass instrumentation and local bone grafting.  Past Medical History  Diagnosis Date  . Hypertension   . Hypothyroidism   . Anxiety   . Mental disorder   . Depression   . Schizo affective schizophrenia   . Shortness of breath   . Diabetes mellitus without complication   . Tardive dyskinesia   . Cyst of kidney, acquired   . Seizures     last was 3-4 years ago  . Arthritis   . CKD (chronic kidney disease)   . CHF (congestive heart failure)     dilated non-ischemic cardiomyopathy; followed at [facility]    Past Surgical History  Procedure Laterality Date  . Hernia repair      times 2  . Ankle arthroscopy  right   . Wrist arthroplasty      at age 32  . Colonoscopy    . Colon resection  July 2014    had mass surgery in [place]  . Transthoracic echocardiogram  06/20/12    mild to moderate LV systolic dysfunction with akinesisa of basilar and mild inferolateral segments, EF 45% (visual 40%), impaired relaxation, mildly dilated LA    History reviewed. No pertinent family history. Social History:  reports that he has never smoked. He does not have any smokeless tobacco history on file. He reports that he does not drink alcohol or use illicit drugs.  Allergies:  Allergies  Allergen Reactions  . Lithium     unknown    Medications Prior to Admission  Medication Sig Dispense Refill  . benztropine (COGENTIN) 2 MG tablet Take 2 mg by mouth 2 (two) times daily.      . carvedilol (COREG) 6.25 MG tablet Take 6.25 mg by mouth 2 (two) times daily with a meal.      . divalproex (DEPAKOTE ER) 250 MG 24 hr tablet Take 1,750 mg by mouth every evening.       . docusate sodium (COLACE) 100 MG capsule Take 100 mg by mouth every other day.      . donepezil (ARICEPT) 10 MG tablet Take 10 mg by mouth at bedtime.       . dutasteride (AVODART) 0.5 MG capsule Take 0.5 mg by mouth daily.      . ergocalciferol (VITAMIN D2) 50000 UNITS capsule Take 50,000 Units by mouth  once a week. Take 50,000 units on Fridays.      . furosemide (LASIX) 20 MG tablet Take 20 mg by mouth daily.      Marland Kitchen levothyroxine (SYNTHROID, LEVOTHROID) 137 MCG tablet Take 137 mcg by mouth daily before breakfast.      . linagliptin (TRADJENTA) 5 MG TABS tablet Take 5 mg by mouth daily.      Marland Kitchen OLANZapine zydis (ZYPREXA) 10 MG disintegrating tablet Take 10 mg by mouth at bedtime.      Marland Kitchen oxybutynin (DITROPAN) 5 MG tablet Take 5 mg by mouth 2 (two) times daily.      . polyethylene glycol powder (GLYCOLAX/MIRALAX) powder Take 17 g by mouth daily. Mix 1 capful (17 grams) with 8 oz of water and drink by mouth every other day.      . potassium chloride 40  MEQ/15ML (20%) LIQD Take 10 mEq by mouth daily. Take 7.5 mls (10 meq) by mouth daily.      . tamsulosin (FLOMAX) 0.4 MG CAPS capsule Take 0.4 mg by mouth 2 (two) times daily.      Marland Kitchen acetaminophen (TYLENOL) 325 MG tablet Take 650 mg by mouth every 6 (six) hours as needed for pain.        No results found for this or any previous visit (from the past 48 hour(s)). Dg Chest 2 View  10/28/2012   CLINICAL DATA:  67 year old male preoperative study for cervical spine surgery.  EXAM: CHEST  2 VIEW  COMPARISON:  CT Abdomen and Pelvis 07/18/2012. Portable chest radiograph 08/18/2011.  FINDINGS: Stable mediastinal contours, ectatic descending thoracic aorta. Cardiac size at the upper limits of normal. The lungs are clear. No pneumothorax or effusion. No acute osseous abnormality identified.  IMPRESSION: No acute cardiopulmonary abnormality.   Electronically Signed   By: Augusto Gamble M.D.   On: 10/28/2012 08:01    Review of Systems  Constitutional: Negative.   HENT: Negative.   Eyes: Negative.   Respiratory: Negative.   Cardiovascular: Negative.   Gastrointestinal: Negative.   Genitourinary: Negative.   Musculoskeletal: Negative.   Skin: Negative.   Neurological: Negative.   Endo/Heme/Allergies: Negative.   Psychiatric/Behavioral: Negative.     Blood pressure 123/83, pulse 63, temperature 97.3 F (36.3 C), temperature source Oral, resp. rate 20, SpO2 98.00%. Physical Exam  Constitutional: He is oriented to person, place, and time. He appears well-developed and well-nourished.  HENT:  Head: Normocephalic and atraumatic.  Left Ear: External ear normal.  Nose: Nose normal.  Mouth/Throat: Oropharynx is clear and moist.  Eyes: Conjunctivae and EOM are normal. Pupils are equal, round, and reactive to light. Right eye exhibits no discharge. Left eye exhibits no discharge.  Neck: No JVD present. No tracheal deviation present.  Marked flexion deformity of the cervical spine. Patient Chin on chest. He is  able to extend with some discomfort and can maintain reasonably normal posture with assisted extension and traction.  Cardiovascular: Normal rate, regular rhythm, normal heart sounds and intact distal pulses.   Respiratory: Effort normal and breath sounds normal. No stridor. No respiratory distress. He has no wheezes.  GI: Soft. Bowel sounds are normal. He exhibits no distension.  Musculoskeletal: Normal range of motion.  Neurological: He is alert and oriented to person, place, and time. He has normal reflexes. He displays normal reflexes. No cranial nerve deficit. He exhibits normal muscle tone. Coordination normal.  Skin: Skin is warm and dry. No rash noted. No erythema.     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 Plan C2-C5 decompressive laminectomy followed by posterior cervical fusion from C2-C7 utilizing lateral mass instrumentation and local autograft. Risks and benefits have been explained. Patient wishes to proceed.  Aparna Vanderweele A 10/28/2012, 9:03 AM

## 2012-10-28 NOTE — Progress Notes (Signed)
Orthopedic Tech Progress Note Patient Details:  Henry Mcbride 1945/06/20 [id number] Called in order for Aspen cervical collar to [facility]. Patient ID: Grove Defina, male   DOB: 14-Apr-1945, 67 y.o.   MRN: [id number]   Lesle Chris 10/28/2012, 1:40 PM

## 2012-10-28 NOTE — Brief Op Note (Signed)
10/28/2012  12:35 PM  PATIENT:  Charlestine Night  67 y.o. male  PRE-OPERATIVE DIAGNOSIS:  spondylosis w/myelopathy  POST-OPERATIVE DIAGNOSIS:  spondylosis w/myelopathy  PROCEDURE:  Procedure(s) with comments: CERVICAL TWO THREE, CERVICAL THREE FOUR, CERVICAL FOUR FIVE, CERVICAL FIVE SIX, CERVICAL SIX SEVEN POSTERIOR CERVICAL FUSION WITH LATERAL MASS FIXATION (N/A) - POSTERIOR CERVICAL FUSION/FORAMINOTOMY LEVEL 5  SURGEON:  Surgeon(s) and Role:    * Temple Pacini, MD - Primary    * Mariam Dollar, MD - Assisting  PHYSICIAN ASSISTANT:   ASSISTANTS:    ANESTHESIA:   general  EBL:  Total I/O In: 650 [I.V.:400; IV Piggyback:250] Out: 950 [Urine:550; Blood:400]  BLOOD ADMINISTERED:none  DRAINS: (Medium) Hemovact drain(s) in the Epidural space with  Suction Open   LOCAL MEDICATIONS USED:  MARCAINE     SPECIMEN:  No Specimen  DISPOSITION OF SPECIMEN:  N/A  COUNTS:  YES  TOURNIQUET:  * No tourniquets in log *  DICTATION: .Dragon Dictation  PLAN OF CARE: Admit to inpatient   PATIENT DISPOSITION:  PACU - hemodynamically stable.   Delay start of Pharmacological VTE agent (>24hrs) due to surgical blood loss or risk of bleeding: yes

## 2012-10-28 NOTE — Progress Notes (Signed)
Orthopedic Tech Progress Note Patient Details:  Henry Mcbride 11/21/1945 [id number] Neuro called for soft collar to be applied until Aspen arrives. Soft collar applied in Neuro PACU. Ortho Devices Type of Ortho Device: Soft collar Ortho Device/Splint Interventions: Application;Ordered   [place] 10/28/2012, 2:24 PM

## 2012-10-28 NOTE — Op Note (Signed)
Date of procedure: 10/28/2012  Date of dictation: Same  Service: Neurosurgery  Preoperative diagnosis: Cervical stenosis with myelopathy.  Cervical kyphosis  Postoperative diagnosis: Same  Procedure Name: C2, C3, C4, partial C5 decompressive laminectomy  C2, C3, C4, C5, C6, C7 posterior lateral arthrodesis utilizing segmental lateral mass instrumentation, local autograft, and morselized allograft.  Surgeon:Lynel Forester A.Akiva Josey, M.D.  Asst. Surgeon: Wynetta Emery  Anesthesia: General  Indication: 67 year old male with severe neck pain with some symptoms of cervical myelopathy. Patient with a marked flexion deformity of the cervical spine. Patient is able to reduce his deformity slightly with extension while undergoing traction. Patient presents now for operative decompression at C2-C5 secondary to marked stenosis followed by posterior lateral arthrodesis with instrumentation for correction of his deformity.  Operative note: After induction of anesthesia, patient positioned supine on the operative bed. [place]field pin headrest was placed. The patient's neck was put under gentle traction and and gently extended. Patient is turned prone onto bolsters and his head was fixed in a neutral head position. Patient's posterior cervical region was prepped and draped sterilely. Incision was made extending from C2-C7. Supper off dissection performed bilaterally exposing the lamina and facet joints at C2-3 456 and 7. Self-retaining traction placed intraoperative fluoroscopy used levels were confirmed. Decompressive laminectomy was then performed using high-speed drill, Kerrison rongeurs, and the Leksell rongeurs. All bone is cleaned in use and later autografting. Ligament flavum was elevated and resected piecemeal fashion. Underlying thecal sac was identified and widely decompressed to the lateral gutters bilaterally. At this point a very thorough decompression achieved from C2-C5. Wound is then irrigated with and bike  solution. Lateral mass screws are then placed at C2 C3-C4 C5-C6 and C7 utilizing fluoroscopic guidance and surface landmarks. 18 mm screws were placed in the C2. 14 mm screws were placed in the other levels. Short segment titanium rod was then contoured placed through the screw heads were C2-C7. Locking caps and placed over the screws were locking caps and engaged with the construct under some mild compression. Lateral facets/articular masses were decorticated using high-speed drill. Morselized autograft packed posterior laterally for later fusion. Master Cindi Carbon bone graft extender was also packed posterior laterally for later fusion. Final images revealed good position the bone graft and hardware at the proper upper level with much improved alignment of the spine. Wound is irrigated one final time. Medium Hemovac drain was left at per space. Wounds and close in layers with Vicryl sutures. Steri-Strips and sterile dressing were applied. There were no apparent complications. Patient tolerated the procedure well and he returns to the recovery room postop.

## 2012-10-29 ENCOUNTER — Encounter (HOSPITAL_COMMUNITY): Payer: Self-pay | Admitting: Neurosurgery

## 2012-10-29 LAB — GLUCOSE, CAPILLARY
Glucose-Capillary: 132 mg/dL — ABNORMAL HIGH (ref 70–99)
Glucose-Capillary: 128 mg/dL — ABNORMAL HIGH (ref 70–99)

## 2012-10-29 MED ORDER — POLYETHYLENE GLYCOL 3350 17 G PO PACK
17.0000 g | PACK | Freq: Every day | ORAL | Status: DC
  Administered 2012-10-29 – 2012-11-01 (×4): 17 g via ORAL
  Filled 2012-10-29 (×5): qty 1

## 2012-10-29 NOTE — Evaluation (Signed)
Physical Therapy Evaluation Patient Details Name: Henry Mcbride MRN: [id number] DOB: Jul 26, 1945 Today's Date: 10/29/2012 Time: 4098-1191 PT Time Calculation (min): 23 min  PT Assessment / Plan / Recommendation History of Present Illness  Patient is a 67 year old male who has a chief complaint of severe neck pain. Workup also notes extreme flexion deformity of the cervical spine with patient essentially chin on chest. He states that his neck pain is somewhat better when he flexes like this. He has some intermittent numbness and paresthesias into his upper extremities. He denies any motor loss. Having no bowel or bladder dysfunction. His gait is become increasingly unstable however this is difficult to determine whether it secondary to his posture or elements of cervical myelopathy. 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 The patient is able to extend somewhat particularly when given gentle traction. With these maneuvers the patient is able to resume her recently normal posture without any evidence of radiating pain or myelopathy. Workup demonstrates the aforementioned stenosis and the kyphotic angulation worse at C4-5 and 6. Patient presents now for posterior cervical decompression from C2-C 5 with posterior cervical fusion from C2-C7 utilizing segmental lateral mass instrumentation and local bone grafting.  Clinical Impression  Pt is s/p PCDF resulting in the deficits listed below (see PT Problem List). Pt able to ambulate 15' with RW and min A today, but pt's mobility is limited by dizziness, fatigue, and pain. Pt states he lives in a group home and someone is present 24/7.  PT recommends home health to access home and assist in mobility training at home.  Pt will benefit from skilled PT to increase their independence and safety with  mobility (while adhering to their precautions) to allow discharge.    PT Assessment  Patient needs continued PT services    Follow Up Recommendations  Home health PT;Supervision/Assistance - 24 hour    Does the patient have the potential to tolerate intense rehabilitation      Barriers to Discharge        Equipment Recommendations  Rolling walker with 5" wheels    Recommendations for Other Services     Frequency Min 5X/week    Precautions / Restrictions Precautions Precautions: Cervical;Fall Precaution Comments: hemovac Required Braces or Orthoses: Cervical Brace Cervical Brace: Hard collar;At all times Restrictions Weight Bearing Restrictions: No   Pertinent Vitals/Pain No c/o pain at rest, increase in neck and L shoulder pain 7/10 with mobility. Pt positioned in bed to comfort at end of  session. Pt reports dizziness/lightheaded upon sitting, O2 sat in sitting on room air: 100%, O2 sat after ambulate on room air: 95%. PT instructed pt to breathe in through nose and out through mouth to improve dizziness.      Mobility  Bed Mobility Bed Mobility: Not assessed Supine to Sit: 5: Supervision Sitting - Scoot to Edge of Bed: 5: Supervision Sit to Supine: 5: Supervision Details for Bed Mobility Assistance: Supervision to ensure safety. Pt performed supine to sit quickly before PT could educate pt on performing log roll to adhere to back precautions, PT will re-emphasize the importance of log rolling at next visit. Increased time to perform bed mobility as pt reports dizziness with sitting up. Transfers Transfers: Sit to Stand;Stand to Sit Sit to Stand: 4: Min guard;With upper extremity assist;With armrests;From chair/3-in-1 Stand to Sit: 4: Min guard;With upper extremity assist;With armrests;To  chair/3-in-1 Details for Transfer Assistance: Cues for hand placement and to keep neck straight Ambulation/Gait Ambulation/Gait Assistance: 4: Min assist Ambulation Distance (Feet): 15  Feet Assistive device: Rolling walker Ambulation/Gait Assistance Details: Min assist to guide RW, VC's for proper gait sequence with RW and for upright posture. Pt c/o fatigue and cervical/L shoulder pain during amb., chair brought to pt to rest. Gait Pattern: Step-to pattern;Decreased stride length;Decreased dorsiflexion - right;Decreased dorsiflexion - left Gait velocity: Decreased    Exercises     PT Diagnosis: Difficulty walking;Acute pain  PT Problem List: Decreased strength;Decreased activity tolerance;Decreased balance;Decreased mobility;Decreased knowledge of use of DME;Decreased knowledge of precautions;Pain PT Treatment Interventions: DME instruction;Gait training;Functional mobility training;Therapeutic activities;Therapeutic exercise;Balance training;Neuromuscular re-education;Patient/family education     PT Goals(Current goals can be found in the care plan section) Acute Rehab PT Goals Patient Stated Goal: to go back to group home PT Goal Formulation: With patient Time For Goal Achievement: 11/12/12 Potential to Achieve Goals: Good  Visit Information  Last PT Received On: 10/29/12 Assistance Needed: +1 History of Present Illness: Patient is a 67 year old male who has a chief complaint of severe neck pain. Workup also notes extreme flexion deformity of the cervical spine with patient essentially chin on chest. He states that his neck pain is somewhat better when he flexes like this. He has some intermittent numbness and paresthesias into his upper extremities. He denies any motor loss. Having no bowel or bladder dysfunction. His gait is become increasingly unstable however this is difficult to determine whether it secondary to his posture or elements of cervical myelopathy. 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  The patient is able to extend somewhat particularly when given gentle traction. With these maneuvers the patient is able to resume her recently normal posture without any evidence of radiating pain or myelopathy. Workup demonstrates the aforementioned stenosis and the kyphotic angulation worse at C4-5 and 6. Patient presents now for posterior cervical decompression from C2-C 5 with posterior cervical fusion from C2-C7 utilizing segmental lateral mass instrumentation and local bone grafting.       Prior Functioning  Home Living Family/patient expects to be discharged to:: Group home Prior Function Level of Independence: Independent Communication Communication: No difficulties    Cognition  Cognition Arousal/Alertness: Awake/alert Behavior During Therapy: WFL for tasks assessed/performed Overall Cognitive Status: Within Functional Limits for tasks assessed    Extremity/Trunk Assessment Upper Extremity Assessment Upper Extremity Assessment: LUE deficits/detail LUE Deficits / Details: Pain near left shoulder-pt reports it feels like muscle pain. Pt with AROM shoulder flexion less than 90 degrees Lower Extremity Assessment Lower Extremity Assessment: Defer to PT evaluation RLE Deficits / Details: Resistance not applied due to c/o of abdominal pain, pt able to move LE's against gravity for hip flex, knee flex/ext, and DF: 3/5. Pt reports intermittent bil numbness/tingling in feet prior to surgery. LLE Deficits / Details: Resistance not applied due to c/o of abdominal pain, pt able to move LE's against gravity for hip flex, knee flex/ext, and DF: 3/5. Pt reports intermittent bil numbness/tingling in feet prior to surgery.   Balance    End of Session PT - End of Session Equipment Utilized During Treatment: Gait belt;Cervical collar;Other (comment) Activity Tolerance: Patient limited by fatigue;Patient limited by pain Patient left: with bed alarm set;with call bell/phone within reach;Other  (comment);in chair (OT in room at end of  session.)  GP     Sol Blazing 10/29/2012, 2:07 PM

## 2012-10-29 NOTE — Progress Notes (Signed)
Pt foley discontinued at 0535. Will continue to monitor for voiding and incoming RN informed. Rodell Perna Rebbie Lauricella RN

## 2012-10-29 NOTE — Evaluation (Addendum)
Occupational Therapy Evaluation Patient Details Name: Henry Mcbride MRN: [id number] DOB: Apr 10, 1945 Today's Date: 10/29/2012 Time: 7829-5621 OT Time Calculation (min): 22 min  OT Assessment / Plan / Recommendation History of present illness Patient is a 67 year old male who has a chief complaint of severe neck pain. Workup also notes extreme flexion deformity of the cervical spine with patient essentially chin on chest. He states that his neck pain is somewhat better when he flexes like this. He has some intermittent numbness and paresthesias into his upper extremities. He denies any motor loss. Having no bowel or bladder dysfunction. His gait is become increasingly unstable however this is difficult to determine whether it secondary to his posture or elements of cervical myelopathy. 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 The patient is able to extend somewhat particularly when given gentle traction. With these maneuvers the patient is able to resume her recently normal posture without any evidence of radiating pain or myelopathy. Workup demonstrates the aforementioned stenosis and the kyphotic angulation worse at C4-5 and 6. Patient presents now for posterior cervical decompression from C2-C 5 with posterior cervical fusion from C2-C7 utilizing segmental lateral mass instrumentation and local bone grafting.   Clinical Impression   Pt presents with below problem list. Pt states he lives in a group home (according to PT note, pt has someone present 24/7) . Pt independent with ADLs, PTA. Pt will benefit from skilled OT to increase their independence and safety (while adhering to their precautions) prior to d/c.     OT Assessment  Patient needs continued OT Services    Follow Up Recommendations  Home health OT;Supervision/Assistance -  24 hour    Barriers to Discharge      Equipment Recommendations  3 in 1 bedside comode    Recommendations for Other Services    Frequency  Min 2X/week    Precautions / Restrictions Precautions Precautions: Cervical;Fall Precaution Comments: hemovac Required Braces or Orthoses: Cervical Brace Cervical Brace: Hard collar;At all times Restrictions Weight Bearing Restrictions: No   Pertinent Vitals/Pain Pain 8-9/10 in neck. Nurse gave meds during session. C/o pain in left shoulder area. Repositioned at end of session.     ADL  Upper Body Dressing: Minimal assistance Where Assessed - Upper Body Dressing: Supported sitting Lower Body Dressing: Maximal assistance Where Assessed - Lower Body Dressing: Supported sit to Pharmacist, hospital: Hydrographic surveyor Method: Sit to Barista: Materials engineer and Hygiene: Minimal assistance Where Assessed - Engineer, mining and Hygiene: Other (comment) (sit to stand from recliner chair) Tub/Shower Transfer Method: Not assessed Equipment Used: Gait belt;Rolling walker;Other (comment);Reacher (cervical collar) Transfers/Ambulation Related to ADLs: Min guard  ADL Comments: OT educated on use of two cups for teeth care. Pt tried to cross legs over knees for LB dressing and got sock off foot with cues for precautions, and he heard a popping noise in neck. Nurse made aware. OT educated to avoid this technique and introduced AE for LB ADLs. Pt practiced with reacher donning underwear. OT educated he can also lay in bed and perform LB dressing by bringing legs up.     OT Diagnosis: Acute pain  OT Problem List: Decreased strength;Decreased activity tolerance;Impaired balance (sitting and/or standing);Decreased knowledge of use of DME or AE;Decreased knowledge of precautions;Pain;Decreased range of motion OT Treatment Interventions: Self-care/ADL training;DME  and/or AE  instruction;Therapeutic activities;Patient/family education;Balance training   OT Goals(Current goals can be found in the care plan section) Acute Rehab OT Goals Patient Stated Goal: to go back to group home OT Goal Formulation: With patient Time For Goal Achievement: 11/05/12 Potential to Achieve Goals: Good ADL Goals Pt Will Perform Grooming: with modified independence;standing Pt Will Perform Lower Body Bathing: with set-up;with supervision;with adaptive equipment;sit to/from stand Pt Will Perform Upper Body Dressing: with modified independence;sitting Pt Will Perform Lower Body Dressing: with set-up;with supervision;with adaptive equipment;sit to/from stand Pt Will Transfer to Toilet: with modified independence;ambulating;grab bars (comfort height toilet) Pt Will Perform Toileting - Clothing Manipulation and hygiene: with modified independence;sit to/from stand Pt Will Perform Tub/Shower Transfer: Tub transfer;with supervision;ambulating;rolling walker (tub equipment tbd) Additional ADL Goal #1: Pt will be able to independently verbalize and demonstrate 3/3 cervical precautions.   Visit Information  Last OT Received On: 10/29/12 Assistance Needed: +1 History of Present Illness: Patient is a 67 year old male who has a chief complaint of severe neck pain. Workup also notes extreme flexion deformity of the cervical spine with patient essentially chin on chest. He states that his neck pain is somewhat better when he flexes like this. He has some intermittent numbness and paresthesias into his upper extremities. He denies any motor loss. Having no bowel or bladder dysfunction. His gait is become increasingly unstable however this is difficult to determine whether it secondary to his posture or elements of cervical myelopathy. 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 Patient has marked anterior osteophytic  disease and some element of DISH. The patient is able to extend somewhat particularly when given gentle traction. With these maneuvers the patient is able to resume her recently normal posture without any evidence of radiating pain or myelopathy. Workup demonstrates the aforementioned stenosis and the kyphotic angulation worse at C4-5 and 6. Patient presents now for posterior cervical decompression from C2-C 5 with posterior cervical fusion from C2-C7 utilizing segmental lateral mass instrumentation and local bone grafting.       Prior Functioning     Home Living Family/patient expects to be discharged to:: Group home Prior Function Level of Independence: Independent Communication Communication: No difficulties         Vision/Perception Vision - History Visual History: Cataracts (in left eye)   Cognition  Cognition Arousal/Alertness: Awake/alert Behavior During Therapy: WFL for tasks assessed/performed Overall Cognitive Status: Within Functional Limits for tasks assessed    Extremity/Trunk Assessment Upper Extremity Assessment Upper Extremity Assessment: LUE deficits/detail LUE Deficits / Details: Pain near left shoulder-pt reports it feels like muscle pain. Pt with AROM shoulder flexion less than 90 degrees Lower Extremity Assessment Lower Extremity Assessment: Defer to PT evaluation     Mobility Bed Mobility Bed Mobility: Not assessed Transfers Transfers: Sit to Stand;Stand to Sit Sit to Stand: 4: Min guard;With upper extremity assist;With armrests;From chair/3-in-1 Stand to Sit: 4: Min guard;With upper extremity assist;With armrests;To chair/3-in-1 Details for Transfer Assistance: Cues for hand placement and to keep neck straight     Exercise     Balance     End of Session OT - End of Session Equipment Utilized During Treatment: Gait belt;Rolling walker;Cervical collar Activity Tolerance: Patient tolerated treatment well Patient left: in chair;with call  bell/phone within reach Nurse Communication: Other (comment) (in during part of session)  GO     Earlie Raveling OTR/L [phone] 10/29/2012, 1:34 PM

## 2012-10-29 NOTE — Clinical Social Work Note (Signed)
CSW received consult for possible SNF placement upon medical discharge. PT recommendation for pt is home health PT.  CSW signing off on pt. Please re-consult if pt discharge disposition changes.  Darlyn Chamber, MSW, LCSWA Clinical Social Work [phone]

## 2012-10-29 NOTE — Progress Notes (Signed)
Postop day 1. Pain reasonably controlled. Denies any upper or lower extremity numbness paresthesias or weakness.  Afebrile. Vitals are stable. Drain output moderate. Awake and alert. Oriented and reasonably appropriate. Motor and sensory exam intact. Dressing clean and dry. Cervical posture much more normal. Chin has been effectively returned to a normal horizon.  Doing well postop. Continue efforts at mobilization. Patient will likely need short-term skilled nursing facility placement.

## 2012-10-29 NOTE — Anesthesia Postprocedure Evaluation (Signed)
Anesthesia Post Note  Patient: Henry Mcbride  Procedure(s) Performed: Procedure(s) (LRB): CERVICAL TWO THREE, CERVICAL THREE FOUR, CERVICAL FOUR FIVE, CERVICAL FIVE SIX, CERVICAL SIX SEVEN POSTERIOR CERVICAL FUSION WITH LATERAL MASS FIXATION (N/A)  Anesthesia type: General  Patient location: PACU  Post pain: Pain level controlled and Adequate analgesia  Post assessment: Post-op Vital signs reviewed, Patient's Cardiovascular Status Stable, Respiratory Function Stable, Patent Airway and Pain level controlled  Last Vitals:  Filed Vitals:   10/29/12 0532  BP: 123/77  Pulse: 75  Temp: 36.8 C  Resp: 18    Post vital signs: Reviewed and stable  Level of consciousness: awake, alert  and oriented  Complications: No apparent anesthesia complications

## 2012-10-29 NOTE — Evaluation (Signed)
I have reviewed this note and agree with all findings. Kati Mykia Holton, PT, DPT Pager: [phone]   

## 2012-10-29 NOTE — Transfer of Care (Signed)
Immediate Anesthesia Transfer of Care Note  Patient: Henry Mcbride  Procedure(s) Performed: Procedure(s) with comments: CERVICAL TWO THREE, CERVICAL THREE FOUR, CERVICAL FOUR FIVE, CERVICAL FIVE SIX, CERVICAL SIX SEVEN POSTERIOR CERVICAL FUSION WITH LATERAL MASS FIXATION (N/A) - POSTERIOR CERVICAL FUSION/FORAMINOTOMY LEVEL 5  Patient Location: PACU  Anesthesia Type:General  Level of Consciousness: awake, alert  and oriented  Airway & Oxygen Therapy: Patient Spontanous Breathing and Patient connected to face mask oxygen  Post-op Assessment: Report given to PACU RN, Post -op Vital signs reviewed and stable and Patient moving all extremities X 4  Post vital signs: Reviewed and stable  Complications: No apparent anesthesia complications

## 2012-10-30 LAB — GLUCOSE, CAPILLARY
Glucose-Capillary: 152 mg/dL — ABNORMAL HIGH (ref 70–99)
Glucose-Capillary: 137 mg/dL — ABNORMAL HIGH (ref 70–99)
Glucose-Capillary: 164 mg/dL — ABNORMAL HIGH (ref 70–99)
Glucose-Capillary: 246 mg/dL — ABNORMAL HIGH (ref 70–99)

## 2012-10-30 NOTE — Progress Notes (Signed)
Postop day 2. Patient complains about his collar but otherwise having no complaints. Neurologic exam stable. Awake and alert. Oriented and appropriate. Motor grossly intact. Wound clean and dry. Chest and abdomen benign.  Progressing well. Plan for discharge tomorrow with home health therapies.

## 2012-10-30 NOTE — Progress Notes (Signed)
Patient given Prune juice this morning per patient request to assist with bowel movement. No results at this time.  Henry Mcbride

## 2012-10-30 NOTE — Progress Notes (Addendum)
Occupational Therapy Treatment Patient Details Name: Henry Mcbride MRN: [id number] DOB: 1945-02-21 Today's Date: 10/30/2012 Time: 1014-1050 OT Time Calculation (min): 36 min  OT Assessment / Plan / Recommendation  History of present illness Patient is a 67 year old male who has a chief complaint of severe neck pain. Workup also notes extreme flexion deformity of the cervical spine with patient essentially chin on chest. He states that his neck pain is somewhat better when he flexes like this. He has some intermittent numbness and paresthesias into his upper extremities. He denies any motor loss. Having no bowel or bladder dysfunction. His gait is become increasingly unstable however this is difficult to determine whether it secondary to his posture or elements of cervical myelopathy. 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 The patient is able to extend somewhat particularly when given gentle traction. With these maneuvers the patient is able to resume her recently normal posture without any evidence of radiating pain or myelopathy. Workup demonstrates the aforementioned stenosis and the kyphotic angulation worse at C4-5 and 6. Patient presents now for posterior cervical decompression from C2-C 5 with posterior cervical fusion from C2-C7 utilizing segmental lateral mass instrumentation and local bone grafting.   OT comments  Pt practiced LB dressing technique and also performed grooming at sink and toileting tasks. Pt requiring assistance from OT for bed mobility. Recommending SNF for d/c.   Follow Up Recommendations  SNF;Supervision/Assistance - 24 hour    Barriers to Discharge       Equipment Recommendations  3 in 1 bedside comode    Recommendations for Other Services    Frequency Min 2X/week   Progress towards OT Goals  Progress towards OT goals: Progressing toward goals  Plan Discharge plan needs to be updated    Precautions / Restrictions Precautions Precautions: Cervical;Fall;Other (comment) Precaution Comments: hemovac Required Braces or Orthoses: Cervical Brace Cervical Brace: Hard collar;At all times Restrictions Weight Bearing Restrictions: No   Pertinent Vitals/Pain Pain 9-10/10. Nurse went in and gave meds before session.    ADL  Grooming: Performed;Teeth care;Wash/dry hands;Min guard;Supervision/safety;Set up Where Assessed - Grooming: Unsupported standing;Supported standing Lower Body Dressing: Maximal assistance Where Assessed - Lower Body Dressing: Supine, head of bed up Toilet Transfer: Min guard Toilet Transfer Method: Sit to Barista: Raised toilet seat with arms (or 3-in-1 over toilet) Toileting - Clothing Manipulation and Hygiene: Min guard Where Assessed - Toileting Clothing Manipulation and Hygiene: Sit to stand from 3-in-1 or toilet Equipment Used: Gait belt;Rolling walker;Reacher;Long-handled sponge;Long-handled shoe horn;Sock aid;Other (comment) (cervical brace) Transfers/Ambulation Related to ADLs: Min guard ADL Comments: Pt verbalized he wanted to practice LB dressing in bed. Pt attempted LB dressing while supine in bed and was able to get one sock off. Pt unable to put underwear over feet while maintaining precautions-cues for precautions during tasks. OT educated AE will be best way to perform LB dressing. OT showed pt how to use reacher for LB dressing and educated on other AE for LB ADLs. Pt performed grooming at sink and used two cups for teeth care and also washed hands-several cues to maintain precautions.  Friend (CNA) from group home present during session. OT spoke with her about use of 3 in 1 for a shower chair and also talked about tub transfer. Educated pt on use of button up shirts.    OT Diagnosis:  OT Problem List:   OT Treatment  Interventions:     OT Goals(current goals can now be found in the care plan section) Acute Rehab OT Goals Patient Stated Goal: not stated OT Goal Formulation: With patient Time For Goal Achievement: 11/05/12 Potential to Achieve Goals: Good ADL Goals Pt Will Perform Grooming: with modified independence;standing Pt Will Perform Lower Body Bathing: with set-up;with supervision;with adaptive equipment;sit to/from stand Pt Will Perform Upper Body Dressing: with modified independence;sitting Pt Will Perform Lower Body Dressing: with set-up;with supervision;with adaptive equipment;sit to/from stand Pt Will Transfer to Toilet: with modified independence;ambulating;grab bars (comfort height toilet) Pt Will Perform Toileting - Clothing Manipulation and hygiene: with modified independence;sit to/from stand Pt Will Perform Tub/Shower Transfer: Tub transfer;with supervision;ambulating;rolling walker (tub equipment tbd) Additional ADL Goal #1: Pt will be able to independently verbalize and demonstrate 3/3 cervical precautions.   Visit Information  Last OT Received On: 10/30/12 Assistance Needed: +1 History of Present Illness: Patient is a 67 year old male who has a chief complaint of severe neck pain. Workup also notes extreme flexion deformity of the cervical spine with patient essentially chin on chest. He states that his neck pain is somewhat better when he flexes like this. He has some intermittent numbness and paresthesias into his upper extremities. He denies any motor loss. Having no bowel or bladder dysfunction. His gait is become increasingly unstable however this is difficult to determine whether it secondary to his posture or elements of cervical myelopathy. 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 The patient is able to extend  somewhat particularly when given gentle traction. With these maneuvers the patient is able to resume her recently normal posture without any evidence of radiating pain or myelopathy. Workup demonstrates the aforementioned stenosis and the kyphotic angulation worse at C4-5 and 6. Patient presents now for posterior cervical decompression from C2-C 5 with posterior cervical fusion from C2-C7 utilizing segmental lateral mass instrumentation and local bone grafting.    Subjective Data      Prior Functioning       Cognition  Cognition Arousal/Alertness: Awake/alert Behavior During Therapy: WFL for tasks assessed/performed Overall Cognitive Status: Within Functional Limits for tasks assessed    Mobility  Bed Mobility Bed Mobility: Rolling Left;Rolling Right;Left Sidelying to Sit Rolling Right: 4: Min assist Rolling Left: 3: Mod assist Left Sidelying to Sit: 4: Min assist;3: Mod assist Details for Bed Mobility Assistance: Pt requiring Min/Mod A for bed mobility and log rolling technique. Cues for technique. Transfers Transfers: Sit to Stand;Stand to Sit Sit to Stand: 4: Min guard;With upper extremity assist;From bed;From chair/3-in-1 Stand to Sit: 4: Min guard;With upper extremity assist;To chair/3-in-1;To bed Details for Transfer Assistance: Min guard for safety. Cues for hand placement.    Exercises      Balance     End of Session OT - End of Session Equipment Utilized During Treatment: Gait belt;Rolling walker;Cervical collar Activity Tolerance: Patient tolerated treatment well Patient left: in chair;with call bell/phone within reach;with family/visitor present  GO     Earlie Raveling OTR/L [phone] 10/30/2012, 1:21 PM

## 2012-10-30 NOTE — Progress Notes (Signed)
Physical Therapy Treatment Patient Details Name: Kate Sweetman MRN: [id number] DOB: [birth year] Today's Date: 10/30/2012 Time: 4098-1191 PT Time Calculation (min): 16 min  PT Assessment / Plan / Recommendation  History of Present Illness Patient is a 67 year old male who has a chief complaint of severe neck pain. Workup also notes extreme flexion deformity of the cervical spine with patient essentially chin on chest. He states that his neck pain is somewhat better when he flexes like this. He has some intermittent numbness and paresthesias into his upper extremities. He denies any motor loss. Having no bowel or bladder dysfunction. His gait is become increasingly unstable however this is difficult to determine whether it secondary to his posture or elements of cervical myelopathy. 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 The patient is able to extend somewhat particularly when given gentle traction. With these maneuvers the patient is able to resume her recently normal posture without any evidence of radiating pain or myelopathy. Workup demonstrates the aforementioned stenosis and the kyphotic angulation worse at C4-5 and 6. Patient presents now for posterior cervical decompression from C2-C 5 with posterior cervical fusion from C2-C7 utilizing segmental lateral mass instrumentation and local bone grafting.   PT Comments   Pt reports he will likely require assist upon d/c and more open to d/c to ST-SNF.  Verbally reviewed cervical precautions, log roll technique, and PT answered questions about cervical precautions handout.   Follow Up Recommendations  SNF     Does the patient have the potential to tolerate intense rehabilitation     Barriers to Discharge        Equipment Recommendations  Rolling walker with 5" wheels     Recommendations for Other Services    Frequency Min 5X/week   Progress towards PT Goals Progress towards PT goals: Progressing toward goals  Plan Discharge plan needs to be updated    Precautions / Restrictions Precautions Precautions: Cervical;Fall;Other (comment) Precaution Comments: hemovac Required Braces or Orthoses: Cervical Brace Cervical Brace: Hard collar;At all times Restrictions Weight Bearing Restrictions: No   Pertinent Vitals/Pain 10/10 L shoulder and cervical pain, pt unable to recall if premedicated, notified RN and RN to bring meds    Mobility  Bed Mobility Bed Mobility: Not assessed Transfers Transfers: Sit to Stand;Stand to Sit Sit to Stand: With upper extremity assist;4: Min guard;From chair/3-in-1 Stand to Sit: 4: Min guard;With upper extremity assist;To chair/3-in-1 Details for Transfer Assistance: Min guard for safety and VC's for hand placement. pt on BSC over toilet upon entering room Ambulation/Gait Ambulation/Gait Assistance: 4: Min guard Ambulation Distance (Feet): 90 Feet Assistive device: Rolling walker Ambulation/Gait Assistance Details: continues to report L shoulder and cervical pain 10/10 which limited ambulation distance Gait Pattern: Step-to pattern;Decreased dorsiflexion - right;Decreased dorsiflexion - left;Decreased step length - left;Narrow base of support Gait velocity: Decreased General Gait Details: typically leads with L LE    Exercises     PT Diagnosis:    PT Problem List:   PT Treatment Interventions:     PT Goals (current goals can now be found in the care plan section)    Visit Information  Last PT Received On: 10/30/12 Assistance Needed: +1 History of Present Illness: Patient is a 67 year old male who has a chief complaint of severe neck pain. Workup also notes extreme flexion deformity of the cervical spine with patient essentially chin on chest.  He states that his neck pain is somewhat better when he flexes like this. He  has some intermittent numbness and paresthesias into his upper extremities. He denies any motor loss. Having no bowel or bladder dysfunction. His gait is become increasingly unstable however this is difficult to determine whether it secondary to his posture or elements of cervical myelopathy. 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 The patient is able to extend somewhat particularly when given gentle traction. With these maneuvers the patient is able to resume her recently normal posture without any evidence of radiating pain or myelopathy. Workup demonstrates the aforementioned stenosis and the kyphotic angulation worse at C4-5 and 6. Patient presents now for posterior cervical decompression from C2-C 5 with posterior cervical fusion from C2-C7 utilizing segmental lateral mass instrumentation and local bone grafting.    Subjective Data      Cognition  Cognition Arousal/Alertness: Awake/alert Behavior During Therapy: WFL for tasks assessed/performed Overall Cognitive Status: Within Functional Limits for tasks assessed    Balance     End of Session PT - End of Session Equipment Utilized During Treatment: Gait belt;Cervical collar Activity Tolerance: Patient limited by pain Patient left: in chair;with call bell/phone within reach Nurse Communication: Patient requests pain meds   GP     Teagen Mcleary,KATHrine E 10/30/2012, 10:37 AM Zenovia Jarred, PT, DPT 10/30/2012 Pager: [phone]

## 2012-10-31 LAB — GLUCOSE, CAPILLARY
Glucose-Capillary: 156 mg/dL — ABNORMAL HIGH (ref 70–99)
Glucose-Capillary: 157 mg/dL — ABNORMAL HIGH (ref 70–99)
Glucose-Capillary: 126 mg/dL — ABNORMAL HIGH (ref 70–99)

## 2012-10-31 MED ORDER — MAGNESIUM HYDROXIDE 400 MG/5ML PO SUSP: 30.0000 mL | Freq: Once | ORAL | Status: DC

## 2012-10-31 MED ORDER — CYCLOBENZAPRINE HCL 10 MG PO TABS: 10.0000 mg | ORAL_TABLET | Freq: Three times a day (TID) | ORAL | Status: AC | PRN

## 2012-10-31 MED ORDER — OXYCODONE-ACETAMINOPHEN 5-325 MG PO TABS: 1.0000 | ORAL_TABLET | ORAL | Status: DC | PRN

## 2012-10-31 NOTE — Clinical Social Work Placement (Signed)
Clinical Social Work Department CLINICAL SOCIAL WORK PLACEMENT NOTE 10/31/2012  Patient:  SHLOMA, ROGGENKAMP  Account Number:  [id number] Admit date:  10/28/2012  Clinical Social Worker:  Irving Burton [place], LCSWA  Date/time:  10/31/2012 02:08 PM  Clinical Social Work is seeking post-discharge placement for this patient at the following level of care:   SKILLED NURSING   (*CSW will update this form in Epic as items are completed)   10/31/2012  Patient/family provided with [facility] Department of Clinical Social Works list of facilities offering this level of care within the geographic area requested by the patient (or if unable, by the patients family).  10/31/2012  Patient/family informed of their freedom to choose among providers that offer the needed level of care, that participate in Medicare, Medicaid or managed care program needed by the patient, have an available bed and are willing to accept the patient.  10/31/2012  Patient/family informed of [facility] ownership interest in [facility], as well as of the fact that they are under no obligation to receive care at this facility.  PASARR submitted to EDS on 10/31/2012 PASARR number received from EDS on 10/31/2012  FL2 transmitted to all facilities in geographic area requested by pt/family on  10/31/2012 FL2 transmitted to all facilities within larger geographic area on   Patient informed that his/her managed care company has contracts with or will negotiate with  certain facilities, including the following:     Patient/family informed of bed offers received:  10/31/2012 Patient chooses bed at [facility] Physician recommends and patient chooses bed at  [facility]  Patient to be transferred to [facility] on  10/31/2012 Patient to be transferred to facility by [facility]  The following physician request were entered in Epic:   Additional Comments:  Darlyn Chamber,  MSW, LCSWA Clinical Social Work [phone]

## 2012-10-31 NOTE — Discharge Summary (Signed)
Physician Discharge Summary  Patient ID: Henry Mcbride MRN: [id number] DOB/AGE: [birth year] 67 y.o.  Admit date: 10/28/2012 Discharge date: 10/31/2012  Admission Diagnoses:  Discharge Diagnoses:  Principal Problem:   Spinal stenosis in cervical region Active Problems:   Kyphosis   Discharged Condition: good  Hospital Course: Patient admitted to the hospital where he underwent Mcbride posterior cervical decompression and fusion for treatment of his cervical stenosis and kyphotic deformity. Postoperatively he is doing reasonably well. His preoperative neck pain and deformity are much improved. His symptoms of myelopathy her also improved. He's progressed well with therapy and is ready for discharge back to his group home with further therapy.    Consults:   Significant Diagnostic Studies:   Treatments:   Discharge Exam: Blood pressure 114/66, pulse 81, temperature 98.7 F (37.1 C), temperature source Oral, resp. rate 20, height 6\' 1"  (1.854 m), weight 85.276 kg (188 lb), SpO2 99.00%. Patient awake and alert. He is oriented and reasonably appropriate. His wound is clean and dry. Chest and abdomen benign. Motor examination reveals intact motor strength bilaterally. Sensory examination is nonfocal. Chest and abdomen are benign.  Disposition: Final discharge disposition not confirmed  Discharge Orders   Future Orders Complete By Expires   Face-to-face encounter (required for Medicare/Medicaid patients)  As directed    Comments:     I Iam Henry Mcbride certify that this patient is under my care and that I, or Mcbride nurse practitioner or physician's assistant working with me, had Mcbride face-to-face encounter that meets the physician face-to-face encounter requirements with this patient on 10/31/2012. The encounter with the patient was in whole, or in part for the following medical condition(s) which is the primary reason for home health care (List medical condition): Cervical stenosis with myelopathy   Questions:     The encounter with the patient was in whole, or in part, for the following medical condition, which is the primary reason for home health care:  Cervical stenosis with myelopathy   I certify that, based on my findings, the following services are medically necessary home health services:  Physical therapy   My clinical findings support the need for the above services:  Unable to leave home safely without assistance and/or assistive device   Further, I certify that my clinical findings support that this patient is homebound due to:  Unsafe ambulation due to balance issues   Reason for Medically Necessary Home Health Services:  Therapy- Home Adaptation to Facilitate Safety   Home Health  As directed    Questions:     To provide the following care/treatments:  PT   OT       Medication List         acetaminophen 325 MG tablet  Commonly known as:  TYLENOL  Take 650 mg by mouth every 6 (six) hours as needed for pain.     benztropine 2 MG tablet  Commonly known as:  COGENTIN  Take 2 mg by mouth 2 (two) times daily.     carvedilol 6.25 MG tablet  Commonly known as:  COREG  Take 6.25 mg by mouth 2 (two) times daily with Mcbride meal.     cyclobenzaprine 10 MG tablet  Commonly known as:  FLEXERIL  Take 1 tablet (10 mg total) by mouth 3 (three) times daily as needed for muscle spasms.     divalproex 250 MG 24 hr tablet  Commonly known as:  DEPAKOTE ER  Take 1,750 mg by mouth every evening.     docusate  sodium 100 MG capsule  Commonly known as:  COLACE  Take 100 mg by mouth every other day.     donepezil 10 MG tablet  Commonly known as:  ARICEPT  Take 10 mg by mouth at bedtime.     dutasteride 0.5 MG capsule  Commonly known as:  AVODART  Take 0.5 mg by mouth daily.     ergocalciferol 50000 UNITS capsule  Commonly known as:  VITAMIN D2  Take 50,000 Units by mouth once Mcbride week. Take 50,000 units on Fridays.     furosemide 20 MG tablet  Commonly known as:  LASIX  Take 20  mg by mouth daily.     levothyroxine 137 MCG tablet  Commonly known as:  SYNTHROID, LEVOTHROID  Take 137 mcg by mouth daily before breakfast.     linagliptin 5 MG Tabs tablet  Commonly known as:  TRADJENTA  Take 5 mg by mouth daily.     OLANZapine zydis 10 MG disintegrating tablet  Commonly known as:  ZYPREXA  Take 10 mg by mouth at bedtime.     oxybutynin 5 MG tablet  Commonly known as:  DITROPAN  Take 5 mg by mouth 2 (two) times daily.     oxyCODONE-acetaminophen 5-325 MG per tablet  Commonly known as:  PERCOCET/ROXICET  Take 1-2 tablets by mouth every 4 (four) hours as needed.     polyethylene glycol powder powder  Commonly known as:  GLYCOLAX/MIRALAX  Take 17 g by mouth daily. Mix 1 capful (17 grams) with 8 oz of water and drink by mouth every other day.     potassium chloride 40 MEQ/15ML (20%) Liqd  Take 10 mEq by mouth daily. Take 7.5 mls (10 meq) by mouth daily.     tamsulosin 0.4 MG Caps capsule  Commonly known as:  FLOMAX  Take 0.4 mg by mouth 2 (two) times daily.           Follow-up Information   Follow up with Henry Gallego A, MD. Schedule an appointment as soon as possible for Mcbride visit in 2 weeks.   Specialty:  Neurosurgery   Contact information:   [address] [place] [address] [phone]       Signed: Ceanna Wareing Mcbride 10/31/2012, 10:09 AM

## 2012-10-31 NOTE — Clinical Social Work Psychosocial (Signed)
Clinical Social Work Department BRIEF PSYCHOSOCIAL ASSESSMENT 10/31/2012  Patient:  Henry Mcbride, Henry Mcbride     Account Number:  [id number]     Admit date:  10/28/2012  Clinical Social Worker:  Sherre Lain  Date/Time:  10/31/2012 02:03 PM  Referred by:  Physician  Date Referred:  10/31/2012 Referred for  SNF Placement   Other Referral:   none.   Interview type:  Patient Other interview type:   none.    PSYCHOSOCIAL DATA Living Status:  FACILITY Admitted from facility:   Level of care:  Group Home Primary support name:  Misty Primary support relationship to patient:  NONE Degree of support available:   Administrator of Group Home.    CURRENT CONCERNS Current Concerns  Post-Acute Placement  Behavioral Health Issues   Other Concerns:   none.    SOCIAL WORK ASSESSMENT / PLAN CSW met with pt at bedside. Pt stated that he was previously living in a Group Home, but would like to be discharged to a SNF for therapy. Pt noted that he would like for CSW to keep Misty updated on his SNF placement. CSW spoke with [facility] regarding information above. Pt to be discharged to [facility].   Assessment/plan status:  Psychosocial Support/Ongoing Assessment of Needs Other assessment/ plan:   none.   Information/referral to community resources:   [employer] [facility] [employer] placement.    PATIENTS/FAMILYS RESPONSE TO PLAN OF CARE: Pt was both understanding and agreeable to CSW plan of care.     Darlyn Chamber, MSW, LCSWA Clinical Social Work [phone]

## 2012-10-31 NOTE — Progress Notes (Signed)
Physical Therapy Treatment Patient Details Name: Henry Mcbride MRN: [id number] DOB: April 10, 1945 Today's Date: 10/31/2012 Time: 7829-5621 PT Time Calculation (min): 29 min  PT Assessment / Plan / Recommendation  History of Present Illness Patient is a 67 year old male who has a chief complaint of severe neck pain. Workup also notes extreme flexion deformity of the cervical spine with patient essentially chin on chest. He states that his neck pain is somewhat better when he flexes like this. He has some intermittent numbness and paresthesias into his upper extremities. He denies any motor loss. Having no bowel or bladder dysfunction. His gait is become increasingly unstable however this is difficult to determine whether it secondary to his posture or elements of cervical myelopathy. 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 The patient is able to extend somewhat particularly when given gentle traction. With these maneuvers the patient is able to resume her recently normal posture without any evidence of radiating pain or myelopathy. Workup demonstrates the aforementioned stenosis and the kyphotic angulation worse at C4-5 and 6. Patient presents now for posterior cervical decompression from C2-C 5 with posterior cervical fusion from C2-C7 utilizing segmental lateral mass instrumentation and local bone grafting.   PT Comments   Pt limited in participation today due to pain. Pt ambulated 125' in shuffle gait pattern with min guard, pt also able to perform hand hygiene at sink with supervision.  PT spoke with pt and case manager about d/c home, and pt does not feel well enough to go home. PT recommends ST-SNF, pt agreeable. Pt would continue to benefit from skilled therapy to improve safety and functional mobility.  Follow Up Recommendations  SNF;Supervision/Assistance - 24 hour     Does the patient have the potential to tolerate intense rehabilitation     Barriers to Discharge        Equipment Recommendations  Rolling walker with 5" wheels    Recommendations for Other Services    Frequency Min 5X/week   Progress towards PT Goals Progress towards PT goals: Progressing toward goals  Plan Current plan remains appropriate    Precautions / Restrictions Precautions Precautions: Cervical;Fall Required Braces or Orthoses: Cervical Brace Cervical Brace: Hard collar;At all times Restrictions Weight Bearing Restrictions: No   Pertinent Vitals/Pain Pt reports 10/10 neck pain at rest, with no increase during amb. RN notified and pt given pain meds.    Mobility  Bed Mobility Bed Mobility: Not assessed (Pt on toilet with nsg upon arrival.) Transfers Transfers: Sit to Stand;Stand to Sit Sit to Stand: 4: Min guard;With upper extremity assist;From bed Stand to Sit: 4: Min guard;With upper extremity assist;To bed Details for Transfer Assistance: Min guard for safety and VC's for proper hand placement. Pt reports neck pain is 10/10 at rest, RN gave pt pain med in sitting prior to amb. Ambulation/Gait Ambulation/Gait Assistance: 4: Min guard Ambulation Distance (Feet): 125 Feet Assistive device: Rolling walker Ambulation/Gait Assistance Details: Min guard for safety and to negotiate objects. VC's to improve heel strike,  increase stride length, and to stay within RW. Gait Pattern: Shuffle;Step-to pattern;Decreased stride length;Decreased dorsiflexion - left;Decreased dorsiflexion - right;Narrow base of support Gait velocity: Decreased    Exercises     PT Diagnosis:    PT Problem List:   PT Treatment Interventions:     PT Goals (current goals can now be found in the care plan  section) Acute Rehab PT Goals Patient Stated Goal: to go to rehab PT Goal Formulation: With patient Time For Goal Achievement: 11/12/12 Potential to  Achieve Goals: Good  Visit Information  Last PT Received On: 10/31/12 Assistance Needed: +1 History of Present Illness: Patient is a 66 year old male who has a chief complaint of severe neck pain. Workup also notes extreme flexion deformity of the cervical spine with patient essentially chin on chest. He states that his neck pain is somewhat better when he flexes like this. He has some intermittent numbness and paresthesias into his upper extremities. He denies any motor loss. Having no bowel or bladder dysfunction. His gait is become increasingly unstable however this is difficult to determine whether it secondary to his posture or elements of cervical myelopathy. 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 The patient is able to extend somewhat particularly when given gentle traction. With these maneuvers the patient is able to resume her recently normal posture without any evidence of radiating pain or myelopathy. Workup demonstrates the aforementioned stenosis and the kyphotic angulation worse at C4-5 and 6. Patient presents now for posterior cervical decompression from C2-C 5 with posterior cervical fusion from C2-C7 utilizing segmental lateral mass instrumentation and local bone grafting.    Subjective Data  Patient Stated Goal: to go to rehab   Cognition  Cognition Arousal/Alertness: Awake/alert Behavior During Therapy: WFL for tasks assessed/performed Overall Cognitive Status: Within Functional Limits for tasks assessed Memory: Decreased recall of precautions    Balance  Balance Balance Assessed: Yes Dynamic Standing Balance Dynamic Standing - Balance Support: No upper extremity supported;During functional activity Dynamic Standing - Level of Assistance: 5: Stand by assistance Dynamic Standing - Balance Activities: Reaching  for objects Dynamic Standing - Comments: Supervision for safety while pt washed hands after using bathroom. VC's to adhere to neck precautions as pt unable to state precautions.  End of Session PT - End of Session Equipment Utilized During Treatment: Gait belt;Cervical collar Activity Tolerance: Patient limited by pain Patient left: Other (comment) (pt using toilet, pt told pt to pull cord for RN to get up.) Nurse Communication: Other (comment) (Nsg student informed that pt is on toilet and will need A.)   GP     Sol Blazing 10/31/2012, 11:06 AM

## 2012-10-31 NOTE — Clinical Social Work Note (Signed)
CSW has faxed FL2 to MD secretary for MD signature. CSW has made multiple calls and left message with MD Diplomatic Services operational officer. CSW currently awaiting FL2 signature for pt to be discharged to [facility]. CSW will inform RN of information above.  Darlyn Chamber, MSW, LCSWA Clinical Social Work [phone]

## 2012-10-31 NOTE — Progress Notes (Signed)
I have reviewed this note and agree with all findings. Kati Mikylah Ackroyd, PT, DPT Pager: [phone]   

## 2012-10-31 NOTE — Progress Notes (Signed)
Talked to patient about DCP; patient does not want to go back to the group home with [facility] and wants to go to a skilled nursing facility short term after discharge. Irving Burton Soc Worker made aware; Abelino Derrick RN,BSN,MHA

## 2012-11-01 ENCOUNTER — Other Ambulatory Visit: Payer: Self-pay

## 2012-11-01 LAB — GLUCOSE, CAPILLARY: Glucose-Capillary: 99 mg/dL (ref 70–99)

## 2012-11-01 MED ORDER — OXYCODONE-ACETAMINOPHEN 5-325 MG PO TABS: ORAL_TABLET | ORAL | Status: DC

## 2012-11-01 NOTE — Progress Notes (Signed)
Occupational Therapy Treatment Patient Details Name: Henry Mcbride MRN: [id number] DOB: 1945-07-08 Today's Date: 11/01/2012 Time: 4098-1191 OT Time Calculation (min): 10 min  OT Assessment / Plan / Recommendation  History of present illness Patient is a 67 year old male who has a chief complaint of severe neck pain. Workup also notes extreme flexion deformity of the cervical spine with patient essentially chin on chest. He states that his neck pain is somewhat better when he flexes like this. He has some intermittent numbness and paresthesias into his upper extremities. He denies any motor loss. Having no bowel or bladder dysfunction. His gait is become increasingly unstable however this is difficult to determine whether it secondary to his posture or elements of cervical myelopathy. 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 The patient is able to extend somewhat particularly when given gentle traction. With these maneuvers the patient is able to resume her recently normal posture without any evidence of radiating pain or myelopathy. Workup demonstrates the aforementioned stenosis and the kyphotic angulation worse at C4-5 and 6. Patient presents now for posterior cervical decompression from C2-C 5 with posterior cervical fusion from C2-C7 utilizing segmental lateral mass instrumentation and local bone grafting.   OT comments  Pt anticipating d/c to SNF later today.  Follow Up Recommendations  SNF;Supervision/Assistance - 24 hour    Barriers to Discharge       Equipment Recommendations  3 in 1 bedside comode    Recommendations for Other Services    Frequency Min 2X/week   Progress towards OT Goals Progress towards OT goals: Progressing toward goals  Plan Discharge plan remains appropriate    Precautions / Restrictions  Precautions Precautions: Cervical;Fall Required Braces or Orthoses: Cervical Brace Cervical Brace: Hard collar;At all times Restrictions Weight Bearing Restrictions: No   Pertinent Vitals/Pain See vitals    ADL  Grooming: Performed;Teeth care;Wash/dry face;Wash/dry hands;Supervision/safety Where Assessed - Grooming: Unsupported standing Toilet Transfer: Min Pension scheme manager Method: Sit to Barista: Raised toilet seat with arms (or 3-in-1 over toilet) Toileting - Clothing Manipulation and Hygiene: Min guard Where Assessed - Glass blower/designer Manipulation and Hygiene: Standing Equipment Used: Gait belt;Rolling walker Transfers/Ambulation Related to ADLs: min guard with RW    OT Diagnosis:    OT Problem List:   OT Treatment Interventions:     OT Goals(current goals can now be found in the care plan section) Acute Rehab OT Goals Patient Stated Goal: none specified ADL Goals Pt Will Perform Grooming: with modified independence;standing Pt Will Perform Lower Body Bathing: with set-up;with supervision;with adaptive equipment;sit to/from stand Pt Will Perform Upper Body Dressing: with modified independence;sitting Pt Will Perform Lower Body Dressing: with set-up;with supervision;with adaptive equipment;sit to/from stand Pt Will Transfer to Toilet: with modified independence;ambulating;grab bars Pt Will Perform Toileting - Clothing Manipulation and hygiene: with modified independence;sit to/from stand Pt Will Perform Tub/Shower Transfer: Tub transfer;with supervision;ambulating;rolling walker Additional ADL Goal #1: Pt will be able to independently verbalize and demonstrate 3/3 cervical precautions.   Visit Information  Last OT Received On: 11/01/12 Assistance Needed: +1 History of Present Illness: Patient is a 67 year old male who has a chief complaint of severe neck pain. Workup also notes extreme flexion deformity of the cervical spine with patient  essentially chin on chest. He states that his neck pain is somewhat better when he flexes like this. He has some intermittent  numbness and paresthesias into his upper extremities. He denies any motor loss. Having no bowel or bladder dysfunction. His gait is become increasingly unstable however this is difficult to determine whether it secondary to his posture or elements of cervical myelopathy. 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 The patient is able to extend somewhat particularly when given gentle traction. With these maneuvers the patient is able to resume her recently normal posture without any evidence of radiating pain or myelopathy. Workup demonstrates the aforementioned stenosis and the kyphotic angulation worse at C4-5 and 6. Patient presents now for posterior cervical decompression from C2-C 5 with posterior cervical fusion from C2-C7 utilizing segmental lateral mass instrumentation and local bone grafting.    Subjective Data      Prior Functioning       Cognition  Cognition Arousal/Alertness: Awake/alert Behavior During Therapy: WFL for tasks assessed/performed Overall Cognitive Status: Within Functional Limits for tasks assessed Memory: Decreased recall of precautions    Mobility  Bed Mobility Bed Mobility: Not assessed Details for Bed Mobility Assistance: Pt in chair upon entering room. Transfers Transfers: Sit to Stand;Stand to Sit Sit to Stand: 4: Min guard;From chair/3-in-1 Stand to Sit: 4: Min guard;To chair/3-in-1    Exercises     Balance     End of Session OT - End of Session Equipment Utilized During Treatment: Gait belt;Rolling walker Activity Tolerance: Patient tolerated treatment well Patient left: in chair;with call bell/phone within reach  GO   11/01/2012 Cipriano Mile  OTR/L Pager [phone] Office [phone]   Cipriano Mile 11/01/2012, 3:58 PM

## 2012-11-01 NOTE — Progress Notes (Signed)
Physical Therapy Treatment  Patient Details Name: Henry Mcbride MRN: [id number] DOB: [birth year] Today's Date: 11/01/2012 Time: 4098-1191 PT Time Calculation (min): 25 min  PT Assessment / Plan / Recommendation  History of Present Illness Patient is a 67 year old male who has a chief complaint of severe neck pain. Workup also notes extreme flexion deformity of the cervical spine with patient essentially chin on chest. He states that his neck pain is somewhat better when he flexes like this. He has some intermittent numbness and paresthesias into his upper extremities. He denies any motor loss. Having no bowel or bladder dysfunction. His gait is become increasingly unstable however this is difficult to determine whether it secondary to his posture or elements of cervical myelopathy. 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 The patient is able to extend somewhat particularly when given gentle traction. With these maneuvers the patient is able to resume her recently normal posture without any evidence of radiating pain or myelopathy. Workup demonstrates the aforementioned stenosis and the kyphotic angulation worse at C4-5 and 6. Patient presents now for posterior cervical decompression from C2-C 5 with posterior cervical fusion from C2-C7 utilizing segmental lateral mass instrumentation and local bone grafting.   PT Comments   Pt's primary limiting factor in participating in PT continues to be neck pain. Pt ambulated 100' with RW and min guard and performed LE exercises in sitting and supine positions. Pt to be discharged to SNF in order to improve functional mobility and safety.  Follow Up Recommendations  SNF;Supervision/Assistance - 24 hour     Does the patient have the potential to tolerate intense rehabilitation     Barriers to  Discharge        Equipment Recommendations  Rolling walker with 5" wheels    Recommendations for Other Services    Frequency Min 5X/week   Progress towards PT Goals Progress towards PT goals: Progressing toward goals  Plan Current plan remains appropriate    Precautions / Restrictions Precautions Precautions: Cervical;Fall Required Braces or Orthoses: Cervical Brace Cervical Brace: Hard collar;At all times Restrictions Weight Bearing Restrictions: No   Pertinent Vitals/Pain Pt reports 0/10 pain at rest with increase to 10/10 neck pain during mobility. RN notified and pt given pain meds after ambulation. Pt positioned to comfort at end of session.    Mobility  Bed Mobility Bed Mobility: Not assessed Details for Bed Mobility Assistance: Pt in chair upon entering room. Transfers Transfers: Sit to Stand;Stand to Sit Sit to Stand: 4: Min guard;With upper extremity assist;From chair/3-in-1 Stand to Sit: 4: Min guard;To chair/3-in-1;With armrests Details for Transfer Assistance: Min guard for safety as pt attempted sit to stand and had to sit back down due to 10/10 neck pain. Pt took seated rest brace and reported decr. in neck pain 8/10 and ready to try amb. Ambulation/Gait Ambulation/Gait Assistance: 4: Min guard Ambulation Distance (Feet): 100 Feet Assistive device: Rolling walker Ambulation/Gait Assistance Details: Min guard for safety. VC's to improve heel strike and stride length.  Gait Pattern: Shuffle;Step-to pattern;Decreased stride length;Decreased dorsiflexion - left;Decreased dorsiflexion - right;Narrow base of support Gait velocity: Decreased. Stairs: No    Exercises General Exercises - Lower Extremity Ankle Circles/Pumps: AROM;20 reps;Both Long Arc Quad: AROM;20 reps;Both Heel Slides: AROM;Supine;10 reps Hip ABduction/ADduction: AROM;Supine;Both;10 reps Hip Flexion/Marching: AROM;20 reps;Seated;Both   PT Diagnosis:    PT Problem List:   PT  Treatment Interventions:      PT Goals (current goals can now be found in the care plan section) Acute Rehab PT Goals Patient Stated Goal: none specified  Visit Information  Last PT Received On: 11/01/12 Assistance Needed: +1 History of Present Illness: Patient is a 68 year old male who has a chief complaint of severe neck pain. Workup also notes extreme flexion deformity of the cervical spine with patient essentially chin on chest. He states that his neck pain is somewhat better when he flexes like this. He has some intermittent numbness and paresthesias into his upper extremities. He denies any motor loss. Having no bowel or bladder dysfunction. His gait is become increasingly unstable however this is difficult to determine whether it secondary to his posture or elements of cervical myelopathy. 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 The patient is able to extend somewhat particularly when given gentle traction. With these maneuvers the patient is able to resume her recently normal posture without any evidence of radiating pain or myelopathy. Workup demonstrates the aforementioned stenosis and the kyphotic angulation worse at C4-5 and 6. Patient presents now for posterior cervical decompression from C2-C 5 with posterior cervical fusion from C2-C7 utilizing segmental lateral mass instrumentation and local bone grafting.    Subjective Data  Patient Stated Goal: none specified   Cognition  Cognition Arousal/Alertness: Awake/alert Behavior During Therapy: WFL for tasks assessed/performed Overall Cognitive Status: Within Functional Limits for tasks assessed Memory: Decreased recall of precautions    Balance     End of Session PT - End of Session Equipment Utilized During Treatment: Gait belt;Cervical collar Activity Tolerance: Patient limited by  pain Patient left: in chair;with chair alarm set;with call bell/phone within reach Nurse Communication: Patient requests pain meds (RN notified prior to amb, RN gave pt pain meds after amb.)   GP     Sol Blazing 11/01/2012, 1:36 PM

## 2012-11-01 NOTE — Progress Notes (Signed)
Report to [facility] Nurse, Rolly Pancake, LPN.

## 2012-11-01 NOTE — Telephone Encounter (Signed)
Verified dose and instructions reflect manual request received by nursing home.   

## 2012-11-01 NOTE — Progress Notes (Signed)
I have reviewed this note and agree with all findings. Kati Ora Bollig, PT, DPT Pager: [phone]   

## 2012-11-05 ENCOUNTER — Non-Acute Institutional Stay (SKILLED_NURSING_FACILITY): Payer: Medicare Other | Admitting: Internal Medicine

## 2012-11-05 DIAGNOSIS — M4802 Spinal stenosis, cervical region: Secondary | ICD-10-CM

## 2012-11-05 DIAGNOSIS — E1059 Type 1 diabetes mellitus with other circulatory complications: Secondary | ICD-10-CM

## 2012-11-05 DIAGNOSIS — R569 Unspecified convulsions: Secondary | ICD-10-CM

## 2012-11-05 NOTE — Progress Notes (Signed)
Patient ID: Henry Mcbride, male   DOB: 02/22/1945, 67 y.o.   MRN: [id number]  Facility; [facility] Chief complaint; admission to SNF post admit to [facility] from September 22 to September 25  History this is a patient with cervical stenosis and a kyphotic deformity. He underwent elective surgical correction appear he would appear that the original orders were for home health PT, however he has arrived in SNF. Not completely certain why there is a discrepancy. His postoperative course appears to been uneventful. The patient's only complaint currently is a critical rash on his antecubital fossa bilaterally  Past Medical History  Diagnosis Date  . Hypertension   . Hypothyroidism   . Anxiety   . Mental disorder   . Depression   . Schizo affective schizophrenia   . Shortness of breath   . Diabetes mellitus without complication   . Tardive dyskinesia   . Cyst of kidney, acquired   . Seizures     last was 3-4 years ago  . Arthritis   . CKD (chronic kidney disease)   . CHF (congestive heart failure)     dilated non-ischemic cardiomyopathy; followed at [facility]   Past Surgical History  Procedure Laterality Date  . Hernia repair      times 2  . Ankle arthroscopy  right  . Wrist arthroplasty      at age 15  . Colonoscopy    . Colon resection  July 2014    had mass surgery in [place]  . Transthoracic echocardiogram  06/20/12    mild to moderate LV systolic dysfunction with akinesisa of basilar and mild inferolateral segments, EF 45% (visual 40%), impaired relaxation, mildly dilated LA  . Posterior cervical fusion/foraminotomy N/A 10/28/2012    Procedure: CERVICAL TWO THREE, CERVICAL THREE FOUR, CERVICAL FOUR FIVE, CERVICAL FIVE SIX, CERVICAL SIX SEVEN POSTERIOR CERVICAL FUSION WITH LATERAL MASS FIXATION;  Surgeon: Temple Pacini, MD;  Location: [facility];  Service: Neurosurgery;  Laterality: N/A;  POSTERIOR CERVICAL FUSION/FORAMINOTOMY LEVEL 5   Medications;  Tylenol 650 every 6 when necessary, Cogentin 2 mg twice a day, carvedilol 6.25 twice a day, cyclobenzaprine 10 mg one tablet by mouth 3 times daily as needed for muscle spasms, Depakote ER 1750 milligrams every morning, donepezil 10 mg every morning, Colace 100 by mouth every other day, Avodart 0.5 by mouth daily, vitamin D 2 50,000 units once a week on Fridays, Lasix 20 mg a day, Synthroid 137 mcg daily, tradjenta 5 mg daily, Zyprexa Zydis 10 mg at bedtime, Ditropan 5 mg 2 times daily, potassium 10 mEq by mouth daily, Flomax 0.4 twice a day   Socially; patient tells me has lived in a group home in [place] for the last year or. Previous to this he was in an assisted living. He is not using an ambulatory assist device. He denies any walking difficulties  reports that he has never smoked. He does not have any smokeless tobacco history on file. He reports that he does not drink alcohol or use illicit drugs.  Review of systems  respiratory; no cough or shortness of breath Cardiac no exertional chest pain no palpitations GI previous abdominal surgery for a hernia and also had part of his colon resected for was deemed not to be a malignant lesion GU no voiding difficulties.  Physical examination General; has a hard collar in place. He does not appear to be in any distress HEENT oral exam is normal Respiratory clear entry bilaterally no wheezing work  of breathing is normal Cardiac heart sounds are normal no murmurs no gallops  Abdomen; surgical scar noted midline in the umbilical area. No liver no spleen no tenderness GU no bladder fullness is noted no tenderness. Extremities peripheral pulses are intact. Some stasis dermatitis is noted Neurologic. There does not appear to be any long tract findings in his legs reflexes are normal both toes downgoing. Arm strength seems intact Skin pruritic rash in his antecubital fossa cease, scratching will give him some hydrocortisone  Impression/plan #1 status  post surgery for cervical spinal stenosis; he tolerated this well he is in a hard collar he is already up ambulating. #2 type 2 diabetes on oral agents. Some concern about using Zyprexa in this setting however it would appear that the benefits at this point outweigh the risks  #3  Schizoaffective disorder. The patient states that this has been much more stable since addition of Zyprexa 6 months ago #4 BPH there is no evidence of urinary retention at this point. The reason for the Ditropan is unclear #5 hypothyroidism on replacement #6 seizure disorder he is on Depakote. #7 cardiomyopathy; there is no evidence that he is in congestive heart failure. I note that he is not on an ACE,ARB or beta blocker  I am not expecting this gentleman to stay here that long. He is already anxious to get back to his group home in [place]

## 2012-11-21 ENCOUNTER — Non-Acute Institutional Stay (SKILLED_NURSING_FACILITY): Payer: Medicare Other | Admitting: Internal Medicine

## 2012-11-21 DIAGNOSIS — N189 Chronic kidney disease, unspecified: Secondary | ICD-10-CM

## 2012-11-21 DIAGNOSIS — E039 Hypothyroidism, unspecified: Secondary | ICD-10-CM

## 2012-11-21 DIAGNOSIS — D631 Anemia in chronic kidney disease: Secondary | ICD-10-CM

## 2012-11-21 DIAGNOSIS — R21 Rash and other nonspecific skin eruption: Secondary | ICD-10-CM

## 2012-12-18 DIAGNOSIS — E039 Hypothyroidism, unspecified: Secondary | ICD-10-CM | POA: Insufficient documentation

## 2012-12-18 DIAGNOSIS — D631 Anemia in chronic kidney disease: Secondary | ICD-10-CM | POA: Insufficient documentation

## 2012-12-18 DIAGNOSIS — N189 Chronic kidney disease, unspecified: Secondary | ICD-10-CM | POA: Insufficient documentation

## 2012-12-18 DIAGNOSIS — R21 Rash and other nonspecific skin eruption: Secondary | ICD-10-CM | POA: Insufficient documentation

## 2012-12-18 NOTE — Progress Notes (Signed)
Patient ID: Henry Mcbride, male   DOB: 1945/08/30, 67 y.o.   MRN: [id number]        PROGRESS NOTE  DATE: 11/21/2012     FACILITY: [facility]  LEVEL OF CARE: SNF (31)  Discharge Visit  CHIEF COMPLAINT:  Manage hypothyroidism and rash.    HISTORY OF PRESENT ILLNESS: I was requested by the social worker to perform face-to-face evaluation for discharge:  Patient was admitted to this facility for short-term rehabilitation after the patient's recent hospitalization.  Patient has completed SNF rehabilitation and therapy has cleared the patient for discharge.  Reassessment of ongoing problem(s):  HYPOTHYROIDISM: The hypothyroidism is unstable. No complications noted from the medications presently being used.  The patient denies fatigue or constipation.  Last TSH:  On 11/06/2012:  TSH _____.    RASH:  New problem.  The patient is complaining of rash and itching on bilateral underarms for a couple of days.  He is also complaining of burning.  He cannot identify precipitating or alleviating factors.  There is no temporal relationship.    PAST MEDICAL HISTORY : Reviewed.  No changes.  CURRENT MEDICATIONS: Reviewed per [facility]  REVIEW OF SYSTEMS:  GENERAL: no change in appetite, no fatigue, no weight changes, no fever, chills or weakness RESPIRATORY: no cough, SOB, DOE, wheezing, hemoptysis CARDIAC: no chest pain, edema or palpitations GI: no abdominal pain, diarrhea, constipation, heart burn, nausea or vomiting  PHYSICAL EXAMINATION  VS:  T 98.8      P 70      RR 18     BP 130/60     POX %       WT (Lb) 200  GENERAL: no acute distress, normal body habitus EYES: conjunctivae normal, sclerae normal, normal eye lids NECK: cervical collar   LYMPHATICS: no LAN in the neck, no supraclavicular LAN RESPIRATORY: breathing is even & unlabored, BS CTAB CARDIAC: RRR, no murmur,no extra heart sounds, no edema GI: abdomen soft, normal BS, no masses, no tenderness, no hepatomegaly, no  splenomegaly PSYCHIATRIC: the patient is alert & oriented to person, affect & behavior appropriate SKIN:  INSPECTION: there is severe erythema on bilateral underarms      LABS/RADIOLOGY: On 11/14/2012:  Hemoglobin A1c 6.5.    Liver profile normal.    On 11/06/2012:  Hemoglobin 11.4, MCV 95, platelets 418.    Glucose 118, creatinine 1.47, otherwise CMP normal.    Depakote level is 64.    ASSESSMENT/PLAN:  Hypothyroidism.  Uncontrolled problem.  Increase levothyroxine to 175 mcg q.d.  Check TSH in six weeks.    Bilateral underarm rash.  New problem.  Start triamcinolone cream 0.5% b.i.d. for one week.    Anemia of chronic kidney disease.  Stable.    Chronic kidney disease.  Monitor.    Spinal stenosis.  Status post posterior cervical decompression and fusion.  Continue cervical collar.  Follow up with neurosurgeon.    Hypertension.  Well controlled.    Diabetes mellitus.  Well controlled.     CHF.  Adequately compensated.    Schizoaffective disorder.  Continue current medications.    I have filled out patient's discharge paperwork and written prescriptions.  Patient will receive home health PT and skilled nursing.   DME provided:  None.    Total discharge time: Greater than 30 minutes Discharge time involved coordination of the discharge process with social worker, nursing staff and therapy department. Medical justification for home health services/DME verified.  CPT CODE: 40981

## 2013-04-07 DIAGNOSIS — R419 Unspecified symptoms and signs involving cognitive functions and awareness: Secondary | ICD-10-CM | POA: Insufficient documentation

## 2013-04-07 DIAGNOSIS — F250 Schizoaffective disorder, bipolar type: Secondary | ICD-10-CM | POA: Insufficient documentation

## 2015-03-20 DIAGNOSIS — R570 Cardiogenic shock: Secondary | ICD-10-CM | POA: Insufficient documentation

## 2015-03-20 DIAGNOSIS — N179 Acute kidney failure, unspecified: Secondary | ICD-10-CM | POA: Insufficient documentation

## 2015-03-20 DIAGNOSIS — I472 Ventricular tachycardia, unspecified: Secondary | ICD-10-CM | POA: Insufficient documentation

## 2015-03-20 DIAGNOSIS — I249 Acute ischemic heart disease, unspecified: Secondary | ICD-10-CM | POA: Insufficient documentation

## 2015-03-20 DIAGNOSIS — E119 Type 2 diabetes mellitus without complications: Secondary | ICD-10-CM | POA: Insufficient documentation

## 2015-03-20 DIAGNOSIS — K219 Gastro-esophageal reflux disease without esophagitis: Secondary | ICD-10-CM | POA: Insufficient documentation

## 2015-04-05 DIAGNOSIS — Z95810 Presence of automatic (implantable) cardiac defibrillator: Secondary | ICD-10-CM | POA: Insufficient documentation

## 2015-05-29 DIAGNOSIS — A419 Sepsis, unspecified organism: Secondary | ICD-10-CM | POA: Insufficient documentation

## 2015-05-29 DIAGNOSIS — J189 Pneumonia, unspecified organism: Secondary | ICD-10-CM | POA: Insufficient documentation

## 2015-05-29 DIAGNOSIS — R6520 Severe sepsis without septic shock: Secondary | ICD-10-CM

## 2015-05-29 DIAGNOSIS — E872 Acidosis, unspecified: Secondary | ICD-10-CM | POA: Insufficient documentation

## 2015-05-29 DIAGNOSIS — R0902 Hypoxemia: Secondary | ICD-10-CM | POA: Insufficient documentation

## 2015-07-21 DIAGNOSIS — I2510 Atherosclerotic heart disease of native coronary artery without angina pectoris: Secondary | ICD-10-CM

## 2015-07-21 DIAGNOSIS — I509 Heart failure, unspecified: Secondary | ICD-10-CM | POA: Diagnosis not present

## 2015-07-21 DIAGNOSIS — J9601 Acute respiratory failure with hypoxia: Secondary | ICD-10-CM | POA: Diagnosis not present

## 2015-07-21 DIAGNOSIS — I5020 Unspecified systolic (congestive) heart failure: Secondary | ICD-10-CM

## 2015-07-21 DIAGNOSIS — Z95810 Presence of automatic (implantable) cardiac defibrillator: Secondary | ICD-10-CM

## 2015-07-21 DIAGNOSIS — I429 Cardiomyopathy, unspecified: Secondary | ICD-10-CM

## 2015-07-23 DIAGNOSIS — Z95810 Presence of automatic (implantable) cardiac defibrillator: Secondary | ICD-10-CM | POA: Diagnosis not present

## 2015-07-23 DIAGNOSIS — I509 Heart failure, unspecified: Secondary | ICD-10-CM | POA: Diagnosis not present

## 2015-07-23 DIAGNOSIS — J9601 Acute respiratory failure with hypoxia: Secondary | ICD-10-CM | POA: Diagnosis not present

## 2015-07-23 DIAGNOSIS — I429 Cardiomyopathy, unspecified: Secondary | ICD-10-CM | POA: Diagnosis not present

## 2015-07-24 DIAGNOSIS — I5022 Chronic systolic (congestive) heart failure: Secondary | ICD-10-CM | POA: Insufficient documentation

## 2015-07-27 DIAGNOSIS — Z79899 Other long term (current) drug therapy: Secondary | ICD-10-CM | POA: Insufficient documentation

## 2016-01-12 DIAGNOSIS — I2510 Atherosclerotic heart disease of native coronary artery without angina pectoris: Secondary | ICD-10-CM | POA: Insufficient documentation

## 2016-08-20 DIAGNOSIS — S065X9A Traumatic subdural hemorrhage with loss of consciousness of unspecified duration, initial encounter: Secondary | ICD-10-CM | POA: Insufficient documentation

## 2016-08-20 DIAGNOSIS — S065XAA Traumatic subdural hemorrhage with loss of consciousness status unknown, initial encounter: Secondary | ICD-10-CM | POA: Insufficient documentation

## 2016-12-25 ENCOUNTER — Ambulatory Visit (INDEPENDENT_AMBULATORY_CARE_PROVIDER_SITE_OTHER): Payer: Medicare Other | Admitting: Podiatry

## 2016-12-25 ENCOUNTER — Encounter: Payer: Self-pay | Admitting: Podiatry

## 2016-12-25 ENCOUNTER — Ambulatory Visit: Payer: Medicare Other | Admitting: Podiatry

## 2016-12-25 VITALS — BP 127/72 | HR 83

## 2016-12-25 DIAGNOSIS — E1322 Other specified diabetes mellitus with diabetic chronic kidney disease: Secondary | ICD-10-CM | POA: Diagnosis not present

## 2016-12-25 DIAGNOSIS — I129 Hypertensive chronic kidney disease with stage 1 through stage 4 chronic kidney disease, or unspecified chronic kidney disease: Secondary | ICD-10-CM | POA: Diagnosis not present

## 2016-12-25 DIAGNOSIS — M79609 Pain in unspecified limb: Secondary | ICD-10-CM

## 2016-12-25 DIAGNOSIS — B351 Tinea unguium: Secondary | ICD-10-CM | POA: Diagnosis not present

## 2016-12-25 DIAGNOSIS — IMO0001 Reserved for inherently not codable concepts without codable children: Secondary | ICD-10-CM

## 2016-12-25 NOTE — Progress Notes (Signed)
Subjective:  Patient ID: Henry Mcbride, male    DOB: Nov 07, 1945,  MRN: [id number]  Chief Complaint  Patient presents with  . Diabetes    Diabetic foot exam   . Nail Problem    nail care   . Callouses    callus care    71 y.o. male presents for diabetic foot care. Last AMBS was 135.  States that the last of his nails were trimmed to his PCP office she heard him.  Versus CKD not on dialys.  Reports pain from his nails  Also reports right foot and leg discoloration from an accident when he was much younger.  Denies numbness and tingling in their feet. Denies cramping in legs and thighs.  Past Medical History:  Diagnosis Date  . Anxiety   . Arthritis   . CHF (congestive heart failure) (HCC)    dilated non-ischemic cardiomyopathy; [facility]  . CKD (chronic kidney disease)   . Cyst of kidney, acquired   . Depression   . Diabetes mellitus without complication (HCC)   . Hypertension   . Hypothyroidism   . Mental disorder   . Schizo affective schizophrenia (HCC)   . Seizures (HCC)    last was 3-4 years ago  . Shortness of breath   . Tardive dyskinesia    Past Surgical History:  Procedure Laterality Date  . ANKLE ARTHROSCOPY  right  . CERVICAL TWO THREE, CERVICAL THREE FOUR, CERVICAL FOUR FIVE, CERVICAL FIVE SIX, CERVICAL SIX SEVEN POSTERIOR CERVICAL FUSION WITH LATERAL MASS FIXATION N/A 10/28/2012   [facility] by Temple PaciniPool, Henry A[facility]  . COLON RESECTION  July 2014   [place]  . COLONOSCOPY    . HERNIA REPAIR     times 2  . TRANSTHORACIC ECHOCARDIOGRAM  06/20/12   mild to moderate LV systolic dysfunction with akinesisa of basilar and mild inferolateral segments, EF 45% (visual 40%), impaired relaxation, mildly dilated LA  . WRIST ARTHROPLASTY     at age 71    Current Outpatient Medications:  .  benztropine (COGENTIN) 2 MG tablet, Take 2 mg by mouth 2 (two) times daily., Disp: , Rfl:  .  ergocalciferol  (VITAMIN D2) 50000 UNITS capsule, Take 50,000 Units by mouth once a week. Take 50,000 units on Fridays., Disp: , Rfl:  .  linagliptin (TRADJENTA) 5 MG TABS tablet, Take 5 mg by mouth daily., Disp: , Rfl:  .  polyethylene glycol powder (GLYCOLAX/MIRALAX) powder, Take 17 g by mouth daily. Mix 1 capful (17 grams) with 8 oz of water and drink by mouth every other day., Disp: , Rfl:  .  tamsulosin (FLOMAX) 0.4 MG CAPS capsule, Take 0.4 mg by mouth 2 (two) times daily., Disp: , Rfl:  .  acetaminophen (TYLENOL) 325 MG tablet, Take 650 mg by mouth every 6 (six) hours as needed for pain., Disp: , Rfl:  .  carvedilol (COREG) 6.25 MG tablet, Take 6.25 mg by mouth 2 (two) times daily with a meal., Disp: , Rfl:  .  cyclobenzaprine (FLEXERIL) 10 MG tablet, Take 1 tablet (10 mg total) by mouth 3 (three) times daily as needed for muscle spasms. (Patient not taking: Reported on 12/25/2016), Disp: 30 tablet, Rfl: 0 .  divalproex (DEPAKOTE ER) 250 MG 24 hr tablet, Take 1,750 mg by mouth every evening. , Disp: , Rfl:  .  docusate sodium (COLACE) 100 MG capsule, Take 100 mg by mouth every other day., Disp: , Rfl:  .  donepezil (ARICEPT) 10 MG tablet, Take 10 mg by mouth at bedtime. , Disp: , Rfl:  .  dutasteride (AVODART) 0.5 MG capsule, Take 0.5 mg by mouth daily., Disp: , Rfl:  .  furosemide (LASIX) 20 MG tablet, Take 20 mg by mouth daily., Disp: , Rfl:  .  levothyroxine (SYNTHROID, LEVOTHROID) 137 MCG tablet, Take 137 mcg by mouth daily before breakfast., Disp: , Rfl:  .  OLANZapine zydis (ZYPREXA) 10 MG disintegrating tablet, Take 10 mg by mouth at bedtime., Disp: , Rfl:  .  oxybutynin (DITROPAN) 5 MG tablet, Take 5 mg by mouth 2 (two) times daily., Disp: , Rfl:  .  oxyCODONE-acetaminophen (PERCOCET/ROXICET) 5-325 MG per tablet, 1-2 by mouth every 4 hours as needed **DO NOT EXCEED 4 GM OF TYLENOL IN 24 HOURS** (Patient not taking: Reported on 12/25/2016), Disp: 120 tablet, Rfl: 0 .  potassium chloride 40 MEQ/15ML  (20%) LIQD, Take 10 mEq by mouth daily. Take 7.5 mls (10 meq) by mouth daily., Disp: , Rfl:   Allergies  Allergen Reactions  . Lithium Diarrhea and Other (See Comments)    Frequency in urination     Objective:   Vitals:   12/25/16 1142  BP: 127/72  Pulse: 83   General AA&O x3. Normal mood and affect.  Vascular Dorsalis pedis pulses present 1+ bilaterally  Posterior tibial pulses absent bilaterally  Capillary refill normal to all digits. Pedal hair growth normal.  Neurologic Epicritic sensation present bilaterally.  Dermatologic No open lesions. Interspaces clear of maceration.  Normal skin temperature and turgor. Hyperkeratotic lesions: Sub-first metatarsal bilaterally. Nails: brittle, clubbing, discoloration yellow, irregular, wavy nails, onychomycosis, thickening  Orthopedic: No history of amputation. MMT 5/5 in dorsiflexion, plantarflexion, inversion, and eversion. Normal lower extremity joint ROM without pain or crepitus.   Assessment & Plan:  Patient was evaluated and treated and all questions answered.  Diabetes with CKD, Onychomycosis -Educated on diabetic footcare. Diabetic risk level 1 -At risk foot care provided as below.  Procedure: Nail Debridement Rationale: Patient meets criteria for routine foot care due to CKD, Class B findings. Type of Debridement: manual, sharp debridement. Instrumentation: Nail nipper, rotary burr. Number of Nails: 10  Return in about 3 months (around 03/27/2017) for Diabetic Foot Care.

## 2017-03-27 ENCOUNTER — Ambulatory Visit (INDEPENDENT_AMBULATORY_CARE_PROVIDER_SITE_OTHER): Payer: Medicare HMO | Admitting: Podiatry

## 2017-03-27 DIAGNOSIS — M79609 Pain in unspecified limb: Secondary | ICD-10-CM

## 2017-03-27 DIAGNOSIS — E1322 Other specified diabetes mellitus with diabetic chronic kidney disease: Secondary | ICD-10-CM | POA: Diagnosis not present

## 2017-03-27 DIAGNOSIS — L84 Corns and callosities: Secondary | ICD-10-CM | POA: Diagnosis not present

## 2017-03-27 DIAGNOSIS — B351 Tinea unguium: Secondary | ICD-10-CM | POA: Diagnosis not present

## 2017-03-27 DIAGNOSIS — L97311 Non-pressure chronic ulcer of right ankle limited to breakdown of skin: Secondary | ICD-10-CM | POA: Diagnosis not present

## 2017-03-27 DIAGNOSIS — E1151 Type 2 diabetes mellitus with diabetic peripheral angiopathy without gangrene: Secondary | ICD-10-CM | POA: Diagnosis not present

## 2017-03-27 DIAGNOSIS — IMO0001 Reserved for inherently not codable concepts without codable children: Secondary | ICD-10-CM

## 2017-03-27 DIAGNOSIS — I129 Hypertensive chronic kidney disease with stage 1 through stage 4 chronic kidney disease, or unspecified chronic kidney disease: Secondary | ICD-10-CM

## 2017-03-27 NOTE — Progress Notes (Signed)
  Subjective:  Patient ID: Henry Mcbride, male    DOB: 17-Dec-1945,  MRN: [id number]  Chief Complaint  Patient presents with  . Diabetes    diabetic foot exam   BG-126 this AM   . Nail Problem    very thick disfigured discolored nails bilateral    72 y.o. male returns for diabetic foot care. Last AMBS was 126. Denies numbness and tingling in their feet. Denies cramping in legs and thighs.  Reports R ankle wound currently being treated by PCP. Has been applying silvadene and mepilex border. Has HHC.  Objective:   General AA&O x3. Normal mood and affect.  Vascular Dorsalis pedis pulses present 1+ bilaterally  Posterior tibial pulses absent bilaterally  Capillary refill normal to all digits. Pedal hair growth normal.  Neurologic Epicritic sensation present bilaterally. Protective sensation with 5.07 monofilament  present bilaterally. Vibratory sensation present bilaterally.  Dermatologic R anterolateral ankle wound approx 0.5 x 0.2 superficial with fibrotic base. Interspaces clear of maceration.  Normal skin temperature and turgor. Hyperkeratotic lesions: Pre-ulcer L hallux Nails: brittle, onychomycosis, thickening, elongation  Orthopedic: No history of amputation. MMT 5/5 in dorsiflexion, plantarflexion, inversion, and eversion. Normal lower extremity joint ROM without pain or crepitus.    Assessment & Plan:  Patient was evaluated and treated and all questions answered.  R Ankle Wound -Discussed likely delayed healing due to edema, venous insufficiency. -Recommend add-on compression.  Diabetes with CKD, PAD, Onychomycosis -Educated on diabetic footcare. Diabetic risk level 2 -At risk foot care provided as below.  Procedure: Nail Debridement Rationale: Patient meets criteria for routine foot care due to Class B findings, CKD. Type of Debridement: manual, sharp debridement. Instrumentation: Nail nipper, rotary burr. Number of Nails: 10  Procedure: Paring of  Lesion Rationale: painful hyperkeratotic lesion L hallux Type of Debridement: manual, sharp debridement. Instrumentation: 15 blade Number of Lesions: 1   Return in about 1 month (around 04/24/2017) for Wound Check R Leg.

## 2017-03-28 DIAGNOSIS — E785 Hyperlipidemia, unspecified: Secondary | ICD-10-CM | POA: Insufficient documentation

## 2017-04-24 ENCOUNTER — Ambulatory Visit: Payer: Medicare Other | Admitting: Podiatry

## 2017-05-22 ENCOUNTER — Ambulatory Visit (INDEPENDENT_AMBULATORY_CARE_PROVIDER_SITE_OTHER): Payer: Medicare HMO | Admitting: Podiatry

## 2017-05-22 DIAGNOSIS — L84 Corns and callosities: Secondary | ICD-10-CM | POA: Diagnosis not present

## 2017-05-22 DIAGNOSIS — E1151 Type 2 diabetes mellitus with diabetic peripheral angiopathy without gangrene: Secondary | ICD-10-CM | POA: Diagnosis not present

## 2017-06-04 NOTE — Progress Notes (Signed)
  Subjective:  Patient ID: Henry Mcbride, male    DOB: 02-16-1945,  MRN: [id number]  Chief Complaint  Patient presents with  . Skin Problem    Lesion: L great toe medial side x 6 mo; painful Tx: antibiotic cream -sugar: 157 A1C: " don't know" PCP: Haque LOV: x 3 mo   72 y.o. male returns for diabetic foot care. Last AMBS was 157 Denies numbness and tingling in their feet. Denies cramping in legs and thighs.  Objective:   General AA&O x3. Normal mood and affect.  Vascular Dorsalis pedis pulses present 1+ bilaterally  Posterior tibial pulses absent bilaterally  Capillary refill normal to all digits. Pedal hair growth normal.  Neurologic Epicritic sensation present bilaterally. Protective sensation with 5.07 monofilament  present bilaterally. Vibratory sensation present bilaterally.  Dermatologic Interspaces clear of maceration.  Normal skin temperature and turgor. Hyperkeratotic lesions: Pre-ulcer L hallux Nails: brittle, onychomycosis, thickening, elongation  Orthopedic: No history of amputation. MMT 5/5 in dorsiflexion, plantarflexion, inversion, and eversion. Normal lower extremity joint ROM without pain or crepitus.    Assessment & Plan:  Patient was evaluated and treated and all questions answered.  Diabetes with CKD, PAD, Onychomycosis, Porokeratosis -Educated on diabetic footcare. Diabetic risk level 2 -At risk foot care provided as below.  Procedure: Paring of Lesion Rationale: painful hyperkeratotic lesion L hallux Type of Debridement: manual, sharp debridement. Instrumentation: 15 blade Number of Lesions: 1   No follow-ups on file.

## 2017-06-19 ENCOUNTER — Ambulatory Visit (INDEPENDENT_AMBULATORY_CARE_PROVIDER_SITE_OTHER): Payer: Medicare HMO | Admitting: Podiatry

## 2017-06-19 DIAGNOSIS — E1151 Type 2 diabetes mellitus with diabetic peripheral angiopathy without gangrene: Secondary | ICD-10-CM

## 2017-06-19 DIAGNOSIS — E08621 Diabetes mellitus due to underlying condition with foot ulcer: Secondary | ICD-10-CM | POA: Diagnosis not present

## 2017-06-19 DIAGNOSIS — B351 Tinea unguium: Secondary | ICD-10-CM

## 2017-06-19 DIAGNOSIS — L97521 Non-pressure chronic ulcer of other part of left foot limited to breakdown of skin: Secondary | ICD-10-CM | POA: Diagnosis not present

## 2017-06-19 NOTE — Progress Notes (Signed)
  Subjective:  Patient ID: Henry Mcbride, male    DOB: 12-28-1945,  MRN: [id number]  Chief Complaint  Patient presents with  . Callouses    F/U callus trimming -sugar: 122 A1C: " don't know" PCp: Haque LOV: x 1 wk   72 y.o. male returns for diabetic foot care. Last AMBS was 122. PCP Dr. Ardelle Park last seen 1 week ago. Has been using silicone toe sleeve for the L great toe. Denies numbness and tingling in their feet. Denies cramping in legs and thighs.  Objective:   General AA&O x3. Normal mood and affect.  Vascular Dorsalis pedis pulses present 1+ bilaterally  Posterior tibial pulses absent bilaterally  Capillary refill normal to all digits. Pedal hair growth normal.  Neurologic Epicritic sensation present bilaterally. Protective sensation with 5.07 monofilament  present bilaterally. Vibratory sensation present bilaterally.  Dermatologic Interspaces clear of maceration.  Normal skin temperature and turgor. Hyperkeratotic lesions: Pre-ulcer L hallux. Nails: brittle, onychomycosis, thickening, elongation  Orthopedic: No history of amputation. MMT 5/5 in dorsiflexion, plantarflexion, inversion, and eversion. Normal lower extremity joint ROM without pain or crepitus.    Assessment & Plan:  Patient was evaluated and treated and all questions answered.  Diabetes with CKD, PAD, Onychomycosis, Pre-ulcerative callus L Hallux IPJ medial -Educated on diabetic footcare. Diabetic risk level 2 -At risk foot care provided as below. -Medial hallux IPJ pre-ulcer debrided with 312 blade. New silicone sleeve dispensed.  Procedure: Nail Debridement Rationale: Patient meets criteria for routine foot care due to Class B findings Type of Debridement: manual, sharp debridement. Instrumentation: Nail nipper, rotary burr. Number of Nails: 10  Procedure: Selective Debridement of Wound Rationale: Removal of devitalized tissue from the wound to promote healing.  Pre-Debridement Wound Measurements:  overlying hyperkeratosis Post-Debridement Wound Measurements: pre-ulcerative. Type of Debridement: Selective Tissue Removed: Devitalized soft-tissue Instrumentation: 312 blade Dressing: Dry, sterile, compression dressing. Disposition: Patient tolerated procedure well. Patient to return in 1 week for follow-up.  Return in about 2 months (around 08/21/2017) for Diabetic Foot Care.

## 2017-07-12 ENCOUNTER — Ambulatory Visit: Payer: Medicare HMO | Admitting: *Deleted

## 2017-07-12 DIAGNOSIS — E1151 Type 2 diabetes mellitus with diabetic peripheral angiopathy without gangrene: Secondary | ICD-10-CM

## 2017-07-12 DIAGNOSIS — E08621 Diabetes mellitus due to underlying condition with foot ulcer: Secondary | ICD-10-CM

## 2017-07-12 DIAGNOSIS — L97521 Non-pressure chronic ulcer of other part of left foot limited to breakdown of skin: Principal | ICD-10-CM

## 2017-07-31 ENCOUNTER — Ambulatory Visit (INDEPENDENT_AMBULATORY_CARE_PROVIDER_SITE_OTHER): Payer: 59 | Admitting: Podiatry

## 2017-07-31 DIAGNOSIS — R269 Unspecified abnormalities of gait and mobility: Secondary | ICD-10-CM

## 2017-07-31 NOTE — Progress Notes (Signed)
  Subjective:  Patient ID: Henry Mcbride, male    DOB: Dec 01, 1945,  MRN: [id number]  Chief Complaint  Patient presents with  . brace    Pt. interested and wants to discuss about brace measurement   72 y.o. male returns for evaluation for balance bracing.  States that he is being fitted for diabetic shoes and the orthotist believe that he would benefit from balance bracing.  Patient is interested  Objective:   General AA&O x3. Normal mood and affect.  Vascular Dorsalis pedis pulses present 1+ bilaterally  Posterior tibial pulses absent bilaterally  Capillary refill normal to all digits. Pedal hair growth normal.  Neurologic Epicritic sensation present bilaterally. Protective sensation with 5.07 monofilament  present bilaterally. Vibratory sensation present bilaterally.  Dermatologic Interspaces clear of maceration.  Normal skin temperature and turgor. Hyperkeratotic lesions: Pre-ulcer L hallux. Nails: brittle, onychomycosis, thickening, elongation  Orthopedic: No history of amputation. MMT 5/5 in dorsiflexion, plantarflexion, inversion, and eversion. Normal lower extremity joint ROM without pain or crepitus.  Delayed timed up to go.  Slow lumbering gait with reliance upon walker for assistance  Assessment & Plan:  Patient was evaluated and treated and all questions answered.  Gait disturbance with risk for falls -Patient performed poorly on timed up and go and needed assistance to get out of his chair and walk several steps -[facility].  [facility]  No follow-ups on file.

## 2017-08-16 ENCOUNTER — Ambulatory Visit (INDEPENDENT_AMBULATORY_CARE_PROVIDER_SITE_OTHER): Payer: Medicare HMO | Admitting: *Deleted

## 2017-08-16 DIAGNOSIS — L84 Corns and callosities: Secondary | ICD-10-CM

## 2017-08-16 DIAGNOSIS — L97521 Non-pressure chronic ulcer of other part of left foot limited to breakdown of skin: Secondary | ICD-10-CM

## 2017-08-16 DIAGNOSIS — E08621 Diabetes mellitus due to underlying condition with foot ulcer: Secondary | ICD-10-CM

## 2017-08-16 DIAGNOSIS — E1151 Type 2 diabetes mellitus with diabetic peripheral angiopathy without gangrene: Secondary | ICD-10-CM | POA: Diagnosis not present

## 2017-08-21 ENCOUNTER — Ambulatory Visit (INDEPENDENT_AMBULATORY_CARE_PROVIDER_SITE_OTHER): Payer: Medicare HMO | Admitting: Podiatry

## 2017-08-21 ENCOUNTER — Encounter: Payer: Self-pay | Admitting: Podiatry

## 2017-08-21 VITALS — BP 117/71 | HR 88 | Temp 98.6°F | Resp 16

## 2017-08-21 DIAGNOSIS — I872 Venous insufficiency (chronic) (peripheral): Secondary | ICD-10-CM | POA: Diagnosis not present

## 2017-08-21 DIAGNOSIS — R600 Localized edema: Secondary | ICD-10-CM | POA: Diagnosis not present

## 2017-08-21 DIAGNOSIS — L97811 Non-pressure chronic ulcer of other part of right lower leg limited to breakdown of skin: Secondary | ICD-10-CM

## 2017-08-21 DIAGNOSIS — E1151 Type 2 diabetes mellitus with diabetic peripheral angiopathy without gangrene: Secondary | ICD-10-CM | POA: Diagnosis not present

## 2017-08-21 DIAGNOSIS — B351 Tinea unguium: Secondary | ICD-10-CM

## 2017-08-21 NOTE — Progress Notes (Signed)
  Subjective:  Patient ID: Henry Mcbride, male    DOB: Mar 05, 1945,  MRN: [id number]  Chief Complaint  Patient presents with  . debride    B/L nail trimming  . Skin Problem    Lesion: R lateral ankle x couple days; very painful -pt denies drainage/swelling/redness   72 y.o. male returns for diabetic foot care.  Returns for nail care.  Also complains of lesion to the right ankle for couple days.  Very painful.  Denies drainage swelling redness. Objective:   General AA&O x3. Normal mood and affect.  Vascular Dorsalis pedis pulses present 1+ bilaterally  Posterior tibial pulses absent bilaterally  Capillary refill normal to all digits. Pedal hair growth normal.  Neurologic Epicritic sensation present bilaterally. Protective sensation with 5.07 monofilament  present bilaterally. Vibratory sensation present bilaterally.  Dermatologic Interspaces clear of maceration.  Normal skin temperature and turgor. Hyperkeratotic lesions: Pre-ulcer L hallux. Nails: brittle, onychomycosis, thickening, elongation V LU right ankle 0.5 x 0.5 with fibrous cover   Orthopedic: No history of amputation. MMT 5/5 in dorsiflexion, plantarflexion, inversion, and eversion. Normal lower extremity joint ROM without pain or crepitus.    Assessment & Plan:  Patient was evaluated and treated and all questions answered.  Diabetes with CKD, PAD, Onychomycosis, Pre-ulcerative callus L Hallux IPJ medial -Educated on diabetic footcare. Diabetic risk level 2 -At risk foot care provided as below.  Procedure: Nail Debridement Rationale: Patient meets criteria for routine foot care due to Class B findings Type of Debridement: manual, sharp debridement. Instrumentation: Nail nipper, rotary burr. Number of Nails: 10  R Ankle VLU -Debrided as below -Unna boot applied RLE.  Procedure: Selective Debridement of Wound Rationale: Removal of devitalized tissue from the wound to promote healing.  Pre-Debridement Wound  Measurements: 0.5 cm x 0.5 cm x 0.1 cm  Post-Debridement Wound Measurements: same as pre-debridement. Type of Debridement: Selective Tissue Removed: Devitalized soft-tissue Instrumentation: 3-0 mm dermal curette Dressing: Dry, sterile, compression dressing. Disposition: Patient tolerated procedure well. Patient to return in 1 week for follow-up.    Return in about 2 weeks (around 09/04/2017) for R VLU f/u.

## 2017-09-04 ENCOUNTER — Ambulatory Visit (INDEPENDENT_AMBULATORY_CARE_PROVIDER_SITE_OTHER): Payer: Medicare HMO | Admitting: Podiatry

## 2017-09-04 ENCOUNTER — Encounter: Payer: Self-pay | Admitting: Podiatry

## 2017-09-04 VITALS — BP 112/67 | HR 81 | Temp 97.0°F | Resp 16

## 2017-09-04 DIAGNOSIS — L928 Other granulomatous disorders of the skin and subcutaneous tissue: Secondary | ICD-10-CM | POA: Diagnosis not present

## 2017-09-04 DIAGNOSIS — L97521 Non-pressure chronic ulcer of other part of left foot limited to breakdown of skin: Secondary | ICD-10-CM

## 2017-09-04 DIAGNOSIS — I872 Venous insufficiency (chronic) (peripheral): Secondary | ICD-10-CM

## 2017-09-04 DIAGNOSIS — L97811 Non-pressure chronic ulcer of other part of right lower leg limited to breakdown of skin: Secondary | ICD-10-CM

## 2017-09-04 DIAGNOSIS — E08621 Diabetes mellitus due to underlying condition with foot ulcer: Secondary | ICD-10-CM

## 2017-09-04 NOTE — Progress Notes (Signed)
  Subjective:  Patient ID: Henry Mcbride, male    DOB: 1945-12-23,  MRN: [id number]  Chief Complaint  Patient presents with  . Ankle Problem    F/U R VLU Pt. stated," it's still painful." Tx: unna boot (2 days and it didn't do much difference) Pt deneis Drainage/swelling/redness  . foot care    FBS: 127 A1C: NA PCP: Haque LOV: x 1 sday   72 y.o. male returns for diabetic foot care.  Dates the right leg ulceration is still painful could not tolerate the Unna boot.  Believes the ulceration to the left great toe to be back  Discussed is too early for routine foot care.  Objective:   General AA&O x3. Normal mood and affect.  Vascular Dorsalis pedis pulses present 1+ bilaterally  Posterior tibial pulses absent bilaterally  Capillary refill normal to all digits. Pedal hair growth normal.  Neurologic Epicritic sensation present bilaterally. Protective sensation with 5.07 monofilament  present bilaterally. Vibratory sensation present bilaterally.  Dermatologic Interspaces clear of maceration.  Normal skin temperature and turgor. Nails: brittle, onychomycosis, thickening, elongation V LU right ankle 2x1 with granulation tissue.  L hallux ulceration 1x1 post-debridement. No probe to bone. No erythema or warmth.  Orthopedic: No history of amputation. MMT 5/5 in dorsiflexion, plantarflexion, inversion, and eversion. Normal lower extremity joint ROM without pain or crepitus.   Assessment & Plan:  Patient was evaluated and treated and all questions answered.  Diabetes with CKD, PAD, Onychomycosis -Defer nail care today.  R Ankle VLU -Wound cauterized with silver nitrate to promote epithelialization   Procedure: Chemical Cauterization of Granulation Tissue Rationale: Cauterize granular wound base to promote healing.  Wound Measurements: 2 cm x 1 cm x 0.1 cm  Instrumentation: Silver nitrate stick x2 Dressing: Dry, sterile, compression dressing. Disposition: Patient tolerated procedure  well. Patient to return in 1 week for follow-up.  Left hallux ulceration -Ulceration debrided as below  Procedure: Excisional Debridement of Wound Rationale: Removal of non-viable soft tissue from the wound to promote healing.  Anesthesia: none Pre-Debridement Wound Measurements: overlying hyperkeratosis  Post-Debridement Wound Measurements: 1 cm x 1 cm x 0.2 cm  Type of Debridement: Excisional Tissue Removed: Non-viable soft tissue Depth of Debridement: subq Instrumentation: 312 blade  Technique: Sharp excisional debridement to bleeding, viable wound base.  Dressing: Dry, sterile, compression dressing. Disposition: Patient tolerated procedure well. Patient to return in 1 week for follow-up.  Return in about 1 month (around 10/02/2017) for Wound care.

## 2017-09-13 ENCOUNTER — Other Ambulatory Visit: Payer: Medicaid Other

## 2017-10-11 ENCOUNTER — Other Ambulatory Visit: Payer: Medicare HMO

## 2017-10-16 ENCOUNTER — Ambulatory Visit: Payer: Medicare Other | Admitting: Podiatry

## 2017-10-16 ENCOUNTER — Other Ambulatory Visit: Payer: Medicaid Other

## 2017-10-22 ENCOUNTER — Encounter: Payer: Self-pay | Admitting: Podiatry

## 2017-10-22 ENCOUNTER — Ambulatory Visit (INDEPENDENT_AMBULATORY_CARE_PROVIDER_SITE_OTHER): Payer: Medicare HMO | Admitting: Podiatry

## 2017-10-22 ENCOUNTER — Ambulatory Visit (INDEPENDENT_AMBULATORY_CARE_PROVIDER_SITE_OTHER): Payer: Medicare HMO

## 2017-10-22 VITALS — BP 118/68 | HR 73 | Resp 15

## 2017-10-22 DIAGNOSIS — R269 Unspecified abnormalities of gait and mobility: Secondary | ICD-10-CM

## 2017-10-22 DIAGNOSIS — E11621 Type 2 diabetes mellitus with foot ulcer: Secondary | ICD-10-CM

## 2017-10-22 DIAGNOSIS — M6281 Muscle weakness (generalized): Secondary | ICD-10-CM | POA: Diagnosis not present

## 2017-10-22 DIAGNOSIS — L02612 Cutaneous abscess of left foot: Secondary | ICD-10-CM | POA: Diagnosis not present

## 2017-10-22 DIAGNOSIS — I872 Venous insufficiency (chronic) (peripheral): Secondary | ICD-10-CM | POA: Diagnosis not present

## 2017-10-22 DIAGNOSIS — M79672 Pain in left foot: Secondary | ICD-10-CM | POA: Diagnosis not present

## 2017-10-22 DIAGNOSIS — L97521 Non-pressure chronic ulcer of other part of left foot limited to breakdown of skin: Secondary | ICD-10-CM

## 2017-10-22 DIAGNOSIS — R2681 Unsteadiness on feet: Secondary | ICD-10-CM

## 2017-10-22 DIAGNOSIS — E08621 Diabetes mellitus due to underlying condition with foot ulcer: Secondary | ICD-10-CM

## 2017-10-22 DIAGNOSIS — L97811 Non-pressure chronic ulcer of other part of right lower leg limited to breakdown of skin: Secondary | ICD-10-CM | POA: Diagnosis not present

## 2017-10-22 MED ORDER — DOXYCYCLINE HYCLATE 100 MG PO TABS: 100.0000 mg | ORAL_TABLET | Freq: Two times a day (BID) | ORAL | 0 refills | Status: DC

## 2017-10-22 NOTE — Progress Notes (Signed)
Subjective:  Patient ID: Henry Mcbride, male    DOB: April 14, 1945,  MRN: [id number]  Chief Complaint  Patient presents with  . Foot Ulcer    F/U R VLU Pt. stated," Doesn't drain as it used to, it's  better." Tx: none  . Skin Problem    L hallux x few wks; no pain -pt denies drainage, swelling and redness     72 y.o. male presents for wound care.  Reports skin issue to the left great toe for the past few weeks.  Denies drainage swelling and redness.  Reports the right leg ulceration is better is not draining as it used to. Review of Systems: Negative except as noted in the HPI. Denies N/V/F/Ch.  Past Medical History:  Diagnosis Date  . Anxiety   . Arthritis   . CHF (congestive heart failure) (HCC)    dilated non-ischemic cardiomyopathy; followed at [facility]  . CKD (chronic kidney disease)   . Cyst of kidney, acquired   . Depression   . Diabetes mellitus without complication (HCC)   . Hypertension   . Hypothyroidism   . Mental disorder   . Schizo affective schizophrenia (HCC)   . Seizures (HCC)    last was 3-4 years ago  . Shortness of breath   . Tardive dyskinesia     Current Outpatient Medications:  .  acetaminophen (TYLENOL) 325 MG tablet, Take 650 mg by mouth every 6 (six) hours as needed for pain., Disp: , Rfl:  .  amiodarone (PACERONE) 200 MG tablet, Take 200 mg by mouth., Disp: , Rfl:  .  aspirin 81 MG chewable tablet, Chew 81 mg by mouth., Disp: , Rfl:  .  atorvastatin (LIPITOR) 40 MG tablet, Take by mouth., Disp: , Rfl:  .  benztropine (COGENTIN) 2 MG tablet, Take 2 mg by mouth 2 (two) times daily., Disp: , Rfl:  .  benztropine (COGENTIN) 2 MG tablet, Take 2 mg by mouth., Disp: , Rfl:  .  carvedilol (COREG) 6.25 MG tablet, Take 6.25 mg by mouth 2 (two) times daily with a meal., Disp: , Rfl:  .  cyclobenzaprine (FLEXERIL) 10 MG tablet, Take 1 tablet (10 mg total) by mouth 3 (three) times daily as needed for muscle spasms., Disp: 30 tablet, Rfl:  0 .  divalproex (DEPAKOTE ER) 250 MG 24 hr tablet, Take 1,750 mg by mouth every evening. , Disp: , Rfl:  .  divalproex (DEPAKOTE) 500 MG DR tablet, Take by mouth., Disp: , Rfl:  .  docusate sodium (COLACE) 100 MG capsule, Take 100 mg by mouth every other day., Disp: , Rfl:  .  docusate sodium (COLACE) 100 MG capsule, Take 100 mg by mouth., Disp: , Rfl:  .  donepezil (ARICEPT) 10 MG tablet, Take 10 mg by mouth at bedtime. , Disp: , Rfl:  .  dutasteride (AVODART) 0.5 MG capsule, Take 0.5 mg by mouth daily., Disp: , Rfl:  .  ergocalciferol (VITAMIN D2) 50000 UNITS capsule, Take 50,000 Units by mouth once a week. Take 50,000 units on Fridays., Disp: , Rfl:  .  ferrous sulfate 324 (65 Fe) MG TBEC, Take 324 mg by mouth., Disp: , Rfl:  .  finasteride (PROSCAR) 5 MG tablet, Take 5 mg by mouth., Disp: , Rfl:  .  furosemide (LASIX) 20 MG tablet, Take 20 mg by mouth daily., Disp: , Rfl:  .  furosemide (LASIX) 40 MG tablet, Take 40 mg by mouth., Disp: , Rfl:  .  LANTUS 100 UNIT/ML injection, ,  Disp: , Rfl:  .  levothyroxine (SYNTHROID, LEVOTHROID) 125 MCG tablet, Take by mouth., Disp: , Rfl:  .  levothyroxine (SYNTHROID, LEVOTHROID) 137 MCG tablet, Take 137 mcg by mouth daily before breakfast., Disp: , Rfl:  .  linagliptin (TRADJENTA) 5 MG TABS tablet, Take 5 mg by mouth daily., Disp: , Rfl:  .  linagliptin (TRADJENTA) 5 MG TABS tablet, Take 5 mg by mouth., Disp: , Rfl:  .  magnesium oxide (MAG-OX) 400 MG tablet, Take 400 mg by mouth., Disp: , Rfl:  .  metoprolol succinate (TOPROL-XL) 25 MG 24 hr tablet, Take 25 mg by mouth., Disp: , Rfl:  .  OLANZapine (ZYPREXA) 7.5 MG tablet, Take 7.5 mg by mouth., Disp: , Rfl:  .  OLANZapine zydis (ZYPREXA) 10 MG disintegrating tablet, Take 10 mg by mouth at bedtime., Disp: , Rfl:  .  oxybutynin (DITROPAN) 5 MG tablet, Take 5 mg by mouth 2 (two) times daily., Disp: , Rfl:  .  oxyCODONE-acetaminophen (PERCOCET/ROXICET) 5-325 MG per tablet, 1-2 by mouth every 4 hours as  needed **DO NOT EXCEED 4 GM OF TYLENOL IN 24 HOURS**, Disp: 120 tablet, Rfl: 0 .  polyethylene glycol powder (GLYCOLAX/MIRALAX) powder, Take 17 g by mouth daily. Mix 1 capful (17 grams) with 8 oz of water and drink by mouth every other day., Disp: , Rfl:  .  potassium chloride 40 MEQ/15ML (20%) LIQD, Take 10 mEq by mouth daily. Take 7.5 mls (10 meq) by mouth daily., Disp: , Rfl:  .  potassium chloride SA (K-DUR,KLOR-CON) 20 MEQ tablet, , Disp: , Rfl:  .  solifenacin (VESICARE) 10 MG tablet, Take 10 mg by mouth., Disp: , Rfl:  .  tamsulosin (FLOMAX) 0.4 MG CAPS capsule, Take 0.4 mg by mouth 2 (two) times daily., Disp: , Rfl:  .  torsemide (DEMADEX) 20 MG tablet, Take 20 mg by mouth., Disp: , Rfl:  .  doxycycline (VIBRA-TABS) 100 MG tablet, Take 1 tablet (100 mg total) by mouth 2 (two) times daily., Disp: 20 tablet, Rfl: 0  Social History   Tobacco Use  Smoking Status Never Smoker  Smokeless Tobacco Never Used    Allergies  Allergen Reactions  . Lithium Diarrhea and Other (See Comments)    Frequency in urination    Objective:   Vitals:   10/22/17 1106  BP: 118/68  Pulse: 73  Resp: 15   There is no height or weight on file to calculate BMI. Constitutional Well developed. Well nourished.  Vascular Dorsalis pedis pulses palpable bilaterally. Posterior tibial pulses palpable bilaterally. Capillary refill normal to all digits.  No cyanosis or clubbing noted. Pedal hair growth normal.  Neurologic Normal speech. Oriented to person, place, and time. Protective sensation absent  Dermatologic Wound Location: L hallux Wound Base: Mixed Granular/Fibrotic Peri-wound: Calloused Exudate: Scant/small amount Purulent exudate Wound Measurements: -9/16: 1x1    R leg VLU with epithelialization noted  Orthopedic: No pain to palpation either foot.   Radiographs: Taken and reviewed. Hallux IPJ sesamoid. No osseous erosion. Assessment:   1. Abscess of left great toe   2. Diabetic ulcer  of toe of left foot associated with type 2 diabetes mellitus, limited to breakdown of skin (HCC)   3. Venous stasis ulcer of other part of right lower leg limited to breakdown of skin without varicose veins (HCC)    Plan:  Patient was evaluated and treated and all questions answered.  Ulcer L hallux with abscess -Incision and drainage performed today. -Dressed with medihoney, DSD. -Continue  off-loading with surgical shoe. -Culture taken.  We will follow-up next visit -Rx doxycycline due to purulence  Procedure: I&D abcess Anesthesia: none Instrumentation: 312 blade Technique: Following skin prep at 312 blade was used to incise the ulceration.  All redundant skin was debrided with a 312 blade.  Purulent material was encountered.  Culture was taken.  The purulent fluid was drained.  The skin was then cleansed and dressed with medihoney and dry sterile dressing Dressing: Dry, sterile, compression dressing. Disposition: Patient tolerated procedure well. Patient to return in 1 week for follow-up.  VLU R Leg -Epithelialized. -No debridement -Continue Compression Socks  Gait Disturbance, balance issues -Moore Balance Braces dispensed today. Educated on use.  Return in about 1 week (around 10/29/2017) for Abscess L Hallux.

## 2017-10-22 NOTE — Addendum Note (Signed)
Addended by: Dinah BeersALMEIDA GARCIA, Christyn Gutkowski on: 10/22/2017 01:40 PM   Modules accepted: Orders

## 2017-10-22 NOTE — Patient Instructions (Signed)

## 2017-10-23 ENCOUNTER — Other Ambulatory Visit: Payer: Self-pay | Admitting: Podiatry

## 2017-10-23 DIAGNOSIS — I872 Venous insufficiency (chronic) (peripheral): Secondary | ICD-10-CM

## 2017-10-23 DIAGNOSIS — R269 Unspecified abnormalities of gait and mobility: Secondary | ICD-10-CM

## 2017-10-23 DIAGNOSIS — L02612 Cutaneous abscess of left foot: Secondary | ICD-10-CM

## 2017-10-23 DIAGNOSIS — R2681 Unsteadiness on feet: Secondary | ICD-10-CM

## 2017-10-23 DIAGNOSIS — E08621 Diabetes mellitus due to underlying condition with foot ulcer: Secondary | ICD-10-CM

## 2017-10-23 DIAGNOSIS — L97521 Non-pressure chronic ulcer of other part of left foot limited to breakdown of skin: Secondary | ICD-10-CM

## 2017-10-23 DIAGNOSIS — M6281 Muscle weakness (generalized): Secondary | ICD-10-CM

## 2017-10-23 DIAGNOSIS — L97811 Non-pressure chronic ulcer of other part of right lower leg limited to breakdown of skin: Secondary | ICD-10-CM

## 2017-10-25 LAB — WOUND CULTURE

## 2017-10-29 ENCOUNTER — Ambulatory Visit (INDEPENDENT_AMBULATORY_CARE_PROVIDER_SITE_OTHER): Payer: Medicare HMO | Admitting: Podiatry

## 2017-10-29 ENCOUNTER — Encounter: Payer: Self-pay | Admitting: Podiatry

## 2017-10-29 VITALS — BP 112/65 | HR 86 | Temp 98.6°F | Resp 16

## 2017-10-29 DIAGNOSIS — L02612 Cutaneous abscess of left foot: Secondary | ICD-10-CM | POA: Diagnosis not present

## 2017-10-29 DIAGNOSIS — E11621 Type 2 diabetes mellitus with foot ulcer: Secondary | ICD-10-CM

## 2017-10-29 DIAGNOSIS — L97811 Non-pressure chronic ulcer of other part of right lower leg limited to breakdown of skin: Secondary | ICD-10-CM | POA: Diagnosis not present

## 2017-10-29 DIAGNOSIS — I872 Venous insufficiency (chronic) (peripheral): Secondary | ICD-10-CM | POA: Diagnosis not present

## 2017-10-29 DIAGNOSIS — L97521 Non-pressure chronic ulcer of other part of left foot limited to breakdown of skin: Secondary | ICD-10-CM

## 2017-10-29 NOTE — Progress Notes (Signed)
Subjective:  Patient ID: Henry Mcbride, male    DOB: 07-Feb-1945,  MRN: [id number]  Chief Complaint  Patient presents with  . Foot Ulcer    F/U L hallux ulcer Pt. stated," noticed this morning real bloody and with pain and the R ankle it had yellow drainage last night." -pt denies N/V/F??CH    72 y.o. male presents for wound care.  Notices some blood and drainage to the left great toe.  Complains of slight drainage to the right ankle ulcer area  Review of Systems: Negative except as noted in the HPI. Denies N/V/F/Ch.  Past Medical History:  Diagnosis Date  . Anxiety   . Arthritis   . CHF (congestive heart failure) (HCC)    dilated non-ischemic cardiomyopathy; followed at [facility]  . CKD (chronic kidney disease)   . Cyst of kidney, acquired   . Depression   . Diabetes mellitus without complication (HCC)   . Hypertension   . Hypothyroidism   . Mental disorder   . Schizo affective schizophrenia (HCC)   . Seizures (HCC)    last was 3-4 years ago  . Shortness of breath   . Tardive dyskinesia     Current Outpatient Medications:  .  acetaminophen (TYLENOL) 325 MG tablet, Take 650 mg by mouth every 6 (six) hours as needed for pain., Disp: , Rfl:  .  amiodarone (PACERONE) 200 MG tablet, Take 200 mg by mouth., Disp: , Rfl:  .  aspirin 81 MG chewable tablet, Chew 81 mg by mouth., Disp: , Rfl:  .  atorvastatin (LIPITOR) 40 MG tablet, Take by mouth., Disp: , Rfl:  .  benztropine (COGENTIN) 2 MG tablet, Take 2 mg by mouth 2 (two) times daily., Disp: , Rfl:  .  benztropine (COGENTIN) 2 MG tablet, Take 2 mg by mouth., Disp: , Rfl:  .  carvedilol (COREG) 6.25 MG tablet, Take 6.25 mg by mouth 2 (two) times daily with a meal., Disp: , Rfl:  .  cyclobenzaprine (FLEXERIL) 10 MG tablet, Take 1 tablet (10 mg total) by mouth 3 (three) times daily as needed for muscle spasms., Disp: 30 tablet, Rfl: 0 .  divalproex (DEPAKOTE ER) 250 MG 24 hr tablet, Take 1,750 mg by mouth  every evening. , Disp: , Rfl:  .  divalproex (DEPAKOTE) 500 MG DR tablet, Take by mouth., Disp: , Rfl:  .  docusate sodium (COLACE) 100 MG capsule, Take 100 mg by mouth every other day., Disp: , Rfl:  .  docusate sodium (COLACE) 100 MG capsule, Take 100 mg by mouth., Disp: , Rfl:  .  donepezil (ARICEPT) 10 MG tablet, Take 10 mg by mouth at bedtime. , Disp: , Rfl:  .  doxycycline (VIBRA-TABS) 100 MG tablet, Take 1 tablet (100 mg total) by mouth 2 (two) times daily., Disp: 20 tablet, Rfl: 0 .  dutasteride (AVODART) 0.5 MG capsule, Take 0.5 mg by mouth daily., Disp: , Rfl:  .  ergocalciferol (VITAMIN D2) 50000 UNITS capsule, Take 50,000 Units by mouth once a week. Take 50,000 units on Fridays., Disp: , Rfl:  .  ferrous sulfate 324 (65 Fe) MG TBEC, Take 324 mg by mouth., Disp: , Rfl:  .  finasteride (PROSCAR) 5 MG tablet, Take 5 mg by mouth., Disp: , Rfl:  .  furosemide (LASIX) 20 MG tablet, Take 20 mg by mouth daily., Disp: , Rfl:  .  furosemide (LASIX) 40 MG tablet, Take 40 mg by mouth., Disp: , Rfl:  .  LANTUS 100 UNIT/ML  injection, , Disp: , Rfl:  .  levothyroxine (SYNTHROID, LEVOTHROID) 125 MCG tablet, Take by mouth., Disp: , Rfl:  .  levothyroxine (SYNTHROID, LEVOTHROID) 137 MCG tablet, Take 137 mcg by mouth daily before breakfast., Disp: , Rfl:  .  linagliptin (TRADJENTA) 5 MG TABS tablet, Take 5 mg by mouth daily., Disp: , Rfl:  .  linagliptin (TRADJENTA) 5 MG TABS tablet, Take 5 mg by mouth., Disp: , Rfl:  .  magnesium oxide (MAG-OX) 400 MG tablet, Take 400 mg by mouth., Disp: , Rfl:  .  metoprolol succinate (TOPROL-XL) 25 MG 24 hr tablet, Take 25 mg by mouth., Disp: , Rfl:  .  OLANZapine (ZYPREXA) 7.5 MG tablet, Take 7.5 mg by mouth., Disp: , Rfl:  .  OLANZapine zydis (ZYPREXA) 10 MG disintegrating tablet, Take 10 mg by mouth at bedtime., Disp: , Rfl:  .  oxybutynin (DITROPAN) 5 MG tablet, Take 5 mg by mouth 2 (two) times daily., Disp: , Rfl:  .  oxyCODONE-acetaminophen (PERCOCET/ROXICET)  5-325 MG per tablet, 1-2 by mouth every 4 hours as needed **DO NOT EXCEED 4 GM OF TYLENOL IN 24 HOURS**, Disp: 120 tablet, Rfl: 0 .  polyethylene glycol powder (GLYCOLAX/MIRALAX) powder, Take 17 g by mouth daily. Mix 1 capful (17 grams) with 8 oz of water and drink by mouth every other day., Disp: , Rfl:  .  potassium chloride 40 MEQ/15ML (20%) LIQD, Take 10 mEq by mouth daily. Take 7.5 mls (10 meq) by mouth daily., Disp: , Rfl:  .  potassium chloride SA (K-DUR,KLOR-CON) 20 MEQ tablet, , Disp: , Rfl:  .  solifenacin (VESICARE) 10 MG tablet, Take 10 mg by mouth., Disp: , Rfl:  .  tamsulosin (FLOMAX) 0.4 MG CAPS capsule, Take 0.4 mg by mouth 2 (two) times daily., Disp: , Rfl:  .  torsemide (DEMADEX) 20 MG tablet, Take 20 mg by mouth., Disp: , Rfl:   Social History   Tobacco Use  Smoking Status Never Smoker  Smokeless Tobacco Never Used    Allergies  Allergen Reactions  . Lithium Diarrhea and Other (See Comments)    Frequency in urination  Frequent urination   Objective:   Vitals:   10/29/17 1107  BP: 112/65  Pulse: 86  Resp: 16  Temp: 98.6 F (37 C)   There is no height or weight on file to calculate BMI. Constitutional Well developed. Well nourished.  Vascular Dorsalis pedis pulses palpable bilaterally. Posterior tibial pulses palpable bilaterally. Capillary refill normal to all digits.  No cyanosis or clubbing noted. Pedal hair growth normal.  Neurologic Normal speech. Oriented to person, place, and time. Protective sensation absent  Dermatologic Wound Location: L hallux Wound Base: Mixed Granular/Fibrotic Peri-wound: Calloused Exudate: Scant/small amount Purulent exudate Wound Measurements: -9/16: 1x1 -9/26: 0.5x0.5  R leg VLU with epithelialization noted  Orthopedic: No pain to palpation either foot.   Radiographs: Taken and reviewed. Hallux IPJ sesamoid. No osseous erosion. Assessment:   1. Abscess of left great toe   2. Diabetic ulcer of toe of left foot  associated with type 2 diabetes mellitus, limited to breakdown of skin (HCC)   3. Venous stasis ulcer of other part of right lower leg limited to breakdown of skin without varicose veins (HCC)    Plan:  Patient was evaluated and treated and all questions answered.  Ulcer L hallux  -Debridement performed as below. -Dressed with medihoney, DSD. -Continue off-loading with surgical shoe. -Culture reviewed.  MRSA.  Doxycycline should be sufficient.  Procedure: Excisional  Debridement of Wound Rationale: Removal of non-viable soft tissue from the wound to promote healing.  Anesthesia: none Pre-Debridement Wound Measurements: overlying hyperkeratosis  Post-Debridement Wound Measurements: 0.5 cm x 0.5 cm x 0.1 cm  Type of Debridement: Sharp Excisional Tissue Removed: Non-viable soft tissue Depth of Debridement: subcutaneous tissue. Technique: Sharp excisional debridement to bleeding, viable wound base.  Dressing: Dry, sterile, compression dressing. Disposition: Patient tolerated procedure well. Patient to return in 1 week for follow-up.  VLU R Leg -Epithelialized. -No debridement -Tubigrip applied bilat.  Return in about 2 weeks (around 11/12/2017) for Wound Care, Left.

## 2017-11-05 NOTE — Progress Notes (Signed)
Patient ID: Henry Mcbride, male   DOB: December 19, 1945, 72 y.o.   MRN: [id number]   Patient presents to be casted for Moore balance braces with [facility] Certified Pedorthist.  Patient will return in 4 weeks to be fitted.  Patient presents for diabetic shoe pick up, shoes are tried on for good fit.  Patient received 1 pair [employer] [employer] - [place] in 11.5 medium and 3 pairs custom molded diabetic inserts.  Verbal and written break in and wear instructions given.  Patient will follow up for scheduled routine care.

## 2017-11-05 NOTE — Patient Instructions (Signed)

## 2017-11-12 ENCOUNTER — Ambulatory Visit (INDEPENDENT_AMBULATORY_CARE_PROVIDER_SITE_OTHER): Payer: Medicare HMO | Admitting: Podiatry

## 2017-11-12 ENCOUNTER — Encounter: Payer: Self-pay | Admitting: Podiatry

## 2017-11-12 VITALS — BP 106/59 | HR 71 | Temp 97.0°F | Resp 16

## 2017-11-12 DIAGNOSIS — L97521 Non-pressure chronic ulcer of other part of left foot limited to breakdown of skin: Secondary | ICD-10-CM

## 2017-11-12 DIAGNOSIS — E11621 Type 2 diabetes mellitus with foot ulcer: Secondary | ICD-10-CM

## 2017-11-12 NOTE — Progress Notes (Signed)
Subjective:  Patient ID: Henry Mcbride, male    DOB: [birth year],  MRN: [id number]  Chief Complaint  Patient presents with  . Abscess    F/U L Hallux Pt. stated,"it's doing better, but it hurst when my shoe rubs against my toe."  -pt denies N/V/F/Ch/swelling/drainage/redness Tx: diabetic shoe     72 y.o. male presents for wound care. Above history confirmed with patient.  Review of Systems: Negative except as noted in the HPI. Denies N/V/F/Ch.  Past Medical History:  Diagnosis Date  . Anxiety   . Arthritis   . CHF (congestive heart failure) (HCC)    dilated non-ischemic cardiomyopathy; followed at [facility]  . CKD (chronic kidney disease)   . Cyst of kidney, acquired   . Depression   . Diabetes mellitus without complication (HCC)   . Hypertension   . Hypothyroidism   . Mental disorder   . Schizo affective schizophrenia (HCC)   . Seizures (HCC)    last was 3-4 years ago  . Shortness of breath   . Tardive dyskinesia     Current Outpatient Medications:  .  acetaminophen (TYLENOL) 325 MG tablet, Take 650 mg by mouth every 6 (six) hours as needed for pain., Disp: , Rfl:  .  amiodarone (PACERONE) 200 MG tablet, Take 200 mg by mouth., Disp: , Rfl:  .  aspirin 81 MG chewable tablet, Chew 81 mg by mouth., Disp: , Rfl:  .  atorvastatin (LIPITOR) 40 MG tablet, Take by mouth., Disp: , Rfl:  .  benztropine (COGENTIN) 2 MG tablet, Take 2 mg by mouth 2 (two) times daily., Disp: , Rfl:  .  benztropine (COGENTIN) 2 MG tablet, Take 2 mg by mouth., Disp: , Rfl:  .  carvedilol (COREG) 6.25 MG tablet, Take 6.25 mg by mouth 2 (two) times daily with a meal., Disp: , Rfl:  .  cyclobenzaprine (FLEXERIL) 10 MG tablet, Take 1 tablet (10 mg total) by mouth 3 (three) times daily as needed for muscle spasms., Disp: 30 tablet, Rfl: 0 .  divalproex (DEPAKOTE ER) 250 MG 24 hr tablet, Take 1,750 mg by mouth every evening. , Disp: , Rfl:  .  divalproex (DEPAKOTE) 500 MG DR tablet, Take  by mouth., Disp: , Rfl:  .  docusate sodium (COLACE) 100 MG capsule, Take 100 mg by mouth every other day., Disp: , Rfl:  .  docusate sodium (COLACE) 100 MG capsule, Take 100 mg by mouth., Disp: , Rfl:  .  donepezil (ARICEPT) 10 MG tablet, Take 10 mg by mouth at bedtime. , Disp: , Rfl:  .  doxycycline (VIBRA-TABS) 100 MG tablet, Take 1 tablet (100 mg total) by mouth 2 (two) times daily., Disp: 20 tablet, Rfl: 0 .  dutasteride (AVODART) 0.5 MG capsule, Take 0.5 mg by mouth daily., Disp: , Rfl:  .  ergocalciferol (VITAMIN D2) 50000 UNITS capsule, Take 50,000 Units by mouth once a week. Take 50,000 units on Fridays., Disp: , Rfl:  .  ferrous sulfate 324 (65 Fe) MG TBEC, Take 324 mg by mouth., Disp: , Rfl:  .  finasteride (PROSCAR) 5 MG tablet, Take 5 mg by mouth., Disp: , Rfl:  .  furosemide (LASIX) 20 MG tablet, Take 20 mg by mouth daily., Disp: , Rfl:  .  furosemide (LASIX) 40 MG tablet, Take 40 mg by mouth., Disp: , Rfl:  .  LANTUS 100 UNIT/ML injection, , Disp: , Rfl:  .  levothyroxine (SYNTHROID, LEVOTHROID) 125 MCG tablet, Take by mouth., Disp: , Rfl:  .  levothyroxine (SYNTHROID, LEVOTHROID) 137 MCG tablet, Take 137 mcg by mouth daily before breakfast., Disp: , Rfl:  .  linagliptin (TRADJENTA) 5 MG TABS tablet, Take 5 mg by mouth daily., Disp: , Rfl:  .  linagliptin (TRADJENTA) 5 MG TABS tablet, Take 5 mg by mouth., Disp: , Rfl:  .  magnesium oxide (MAG-OX) 400 MG tablet, Take 400 mg by mouth., Disp: , Rfl:  .  metoprolol succinate (TOPROL-XL) 25 MG 24 hr tablet, Take 25 mg by mouth., Disp: , Rfl:  .  OLANZapine (ZYPREXA) 7.5 MG tablet, Take 7.5 mg by mouth., Disp: , Rfl:  .  OLANZapine zydis (ZYPREXA) 10 MG disintegrating tablet, Take 10 mg by mouth at bedtime., Disp: , Rfl:  .  oxybutynin (DITROPAN) 5 MG tablet, Take 5 mg by mouth 2 (two) times daily., Disp: , Rfl:  .  oxyCODONE-acetaminophen (PERCOCET/ROXICET) 5-325 MG per tablet, 1-2 by mouth every 4 hours as needed **DO NOT EXCEED 4 GM  OF TYLENOL IN 24 HOURS**, Disp: 120 tablet, Rfl: 0 .  polyethylene glycol powder (GLYCOLAX/MIRALAX) powder, Take 17 g by mouth daily. Mix 1 capful (17 grams) with 8 oz of water and drink by mouth every other day., Disp: , Rfl:  .  potassium chloride 40 MEQ/15ML (20%) LIQD, Take 10 mEq by mouth daily. Take 7.5 mls (10 meq) by mouth daily., Disp: , Rfl:  .  potassium chloride SA (K-DUR,KLOR-CON) 20 MEQ tablet, , Disp: , Rfl:  .  solifenacin (VESICARE) 10 MG tablet, Take 10 mg by mouth., Disp: , Rfl:  .  tamsulosin (FLOMAX) 0.4 MG CAPS capsule, Take 0.4 mg by mouth 2 (two) times daily., Disp: , Rfl:  .  torsemide (DEMADEX) 20 MG tablet, Take 20 mg by mouth., Disp: , Rfl:   Social History   Tobacco Use  Smoking Status Never Smoker  Smokeless Tobacco Never Used    Allergies  Allergen Reactions  . Lithium Diarrhea and Other (See Comments)    Frequency in urination  Frequent urination   Objective:   Vitals:   11/12/17 1041  BP: (!) 106/59  Pulse: 71  Resp: 16  Temp: (!) 97 F (36.1 C)   There is no height or weight on file to calculate BMI. Constitutional Well developed. Well nourished.  Vascular Dorsalis pedis pulses palpable bilaterally. Posterior tibial pulses palpable bilaterally. Capillary refill normal to all digits.  No cyanosis or clubbing noted. Pedal hair growth normal.  Neurologic Normal speech. Oriented to person, place, and time. Protective sensation absent  Dermatologic Wound Location: L hallux Wound Base: Granular Peri-wound: Calloused, macerated Exudate: Scant/small serous drainage Wound Measurements: -9/16: 1x1 -9/26: 0.5x0.5 -10/7: 0.5x0.5  Orthopedic: No pain to palpation either foot.   Radiographs: Taken and reviewed. Hallux IPJ sesamoid. No osseous erosion. Assessment:   1. Diabetic ulcer of toe of left foot associated with type 2 diabetes mellitus, limited to breakdown of skin (HCC)    Plan:  Patient was evaluated and treated and all questions  answered.  Ulcer L hallux  -Debridement performed as below. -Dressed with medihoney, DSD. -Continue off-loading with surgical shoe. -Culture reviewed.  MRSA.  Doxycycline should be sufficient.  Procedure: Excisional Debridement of Wound Rationale: Removal of non-viable soft tissue from the wound to promote healing.  Anesthesia: none Pre-Debridement Wound Measurements: Overlying hyperkeratosis Post-Debridement Wound Measurements: 0.5 cm x 0.5 cm x 0.1 cm  Type of Debridement: Sharp Excisional Tissue Removed: Non-viable soft tissue Depth of Debridement: subcutaneous tissue. Technique: Sharp excisional debridement to bleeding, viable  wound base.  Dressing: Dry, sterile, compression dressing. Disposition: Patient tolerated procedure well. Patient to return in 1 week for follow-up.  Return in about 2 weeks (around 11/26/2017).

## 2017-11-19 ENCOUNTER — Encounter: Payer: Self-pay | Admitting: Podiatry

## 2017-11-19 ENCOUNTER — Ambulatory Visit (INDEPENDENT_AMBULATORY_CARE_PROVIDER_SITE_OTHER): Payer: Medicare HMO | Admitting: Podiatry

## 2017-11-19 VITALS — BP 110/62 | HR 72 | Temp 96.2°F | Resp 16

## 2017-11-19 DIAGNOSIS — E1151 Type 2 diabetes mellitus with diabetic peripheral angiopathy without gangrene: Secondary | ICD-10-CM

## 2017-11-19 DIAGNOSIS — E11621 Type 2 diabetes mellitus with foot ulcer: Secondary | ICD-10-CM

## 2017-11-19 DIAGNOSIS — B351 Tinea unguium: Secondary | ICD-10-CM | POA: Diagnosis not present

## 2017-11-19 DIAGNOSIS — L97521 Non-pressure chronic ulcer of other part of left foot limited to breakdown of skin: Secondary | ICD-10-CM

## 2017-11-19 NOTE — Progress Notes (Signed)
Subjective:  Patient ID: Henry Mcbride, male    DOB: 1945/03/09,  MRN: [id number]  Chief Complaint  Patient presents with  . Foot Ulcer    F/U L Hallux Pt. stated," it looks about the same and it's hurtiing me." Tx: none -pt denies drainage/swelling/redness/N/v/f/Ch    72 y.o. male presents for wound care.  Above history confirmed with patient Review of Systems: Negative except as noted in the HPI. Denies N/V/F/Ch.  Past Medical History:  Diagnosis Date  . Anxiety   . Arthritis   . CHF (congestive heart failure) (HCC)    dilated non-ischemic cardiomyopathy; followed at [facility]  . CKD (chronic kidney disease)   . Cyst of kidney, acquired   . Depression   . Diabetes mellitus without complication (HCC)   . Hypertension   . Hypothyroidism   . Mental disorder   . Schizo affective schizophrenia (HCC)   . Seizures (HCC)    last was 3-4 years ago  . Shortness of breath   . Tardive dyskinesia     Current Outpatient Medications:  .  acetaminophen (TYLENOL) 325 MG tablet, Take 650 mg by mouth every 6 (six) hours as needed for pain., Disp: , Rfl:  .  amiodarone (PACERONE) 200 MG tablet, Take 200 mg by mouth., Disp: , Rfl:  .  aspirin 81 MG chewable tablet, Chew 81 mg by mouth., Disp: , Rfl:  .  atorvastatin (LIPITOR) 40 MG tablet, Take by mouth., Disp: , Rfl:  .  benztropine (COGENTIN) 2 MG tablet, Take 2 mg by mouth 2 (two) times daily., Disp: , Rfl:  .  benztropine (COGENTIN) 2 MG tablet, Take 2 mg by mouth., Disp: , Rfl:  .  carvedilol (COREG) 6.25 MG tablet, Take 6.25 mg by mouth 2 (two) times daily with a meal., Disp: , Rfl:  .  cyclobenzaprine (FLEXERIL) 10 MG tablet, Take 1 tablet (10 mg total) by mouth 3 (three) times daily as needed for muscle spasms., Disp: 30 tablet, Rfl: 0 .  divalproex (DEPAKOTE ER) 250 MG 24 hr tablet, Take 1,750 mg by mouth every evening. , Disp: , Rfl:  .  divalproex (DEPAKOTE) 500 MG DR tablet, Take by mouth., Disp: , Rfl:  .   docusate sodium (COLACE) 100 MG capsule, Take 100 mg by mouth every other day., Disp: , Rfl:  .  docusate sodium (COLACE) 100 MG capsule, Take 100 mg by mouth., Disp: , Rfl:  .  donepezil (ARICEPT) 10 MG tablet, Take 10 mg by mouth at bedtime. , Disp: , Rfl:  .  doxycycline (VIBRA-TABS) 100 MG tablet, Take 1 tablet (100 mg total) by mouth 2 (two) times daily., Disp: 20 tablet, Rfl: 0 .  dutasteride (AVODART) 0.5 MG capsule, Take 0.5 mg by mouth daily., Disp: , Rfl:  .  ergocalciferol (VITAMIN D2) 50000 UNITS capsule, Take 50,000 Units by mouth once a week. Take 50,000 units on Fridays., Disp: , Rfl:  .  ferrous sulfate 324 (65 Fe) MG TBEC, Take 324 mg by mouth., Disp: , Rfl:  .  finasteride (PROSCAR) 5 MG tablet, Take 5 mg by mouth., Disp: , Rfl:  .  furosemide (LASIX) 20 MG tablet, Take 20 mg by mouth daily., Disp: , Rfl:  .  furosemide (LASIX) 40 MG tablet, Take 40 mg by mouth., Disp: , Rfl:  .  LANTUS 100 UNIT/ML injection, , Disp: , Rfl:  .  levothyroxine (SYNTHROID, LEVOTHROID) 125 MCG tablet, Take by mouth., Disp: , Rfl:  .  levothyroxine (SYNTHROID,  LEVOTHROID) 137 MCG tablet, Take 137 mcg by mouth daily before breakfast., Disp: , Rfl:  .  linagliptin (TRADJENTA) 5 MG TABS tablet, Take 5 mg by mouth daily., Disp: , Rfl:  .  linagliptin (TRADJENTA) 5 MG TABS tablet, Take 5 mg by mouth., Disp: , Rfl:  .  magnesium oxide (MAG-OX) 400 MG tablet, Take 400 mg by mouth., Disp: , Rfl:  .  metoprolol succinate (TOPROL-XL) 25 MG 24 hr tablet, Take 25 mg by mouth., Disp: , Rfl:  .  OLANZapine (ZYPREXA) 7.5 MG tablet, Take 7.5 mg by mouth., Disp: , Rfl:  .  OLANZapine zydis (ZYPREXA) 10 MG disintegrating tablet, Take 10 mg by mouth at bedtime., Disp: , Rfl:  .  oxybutynin (DITROPAN) 5 MG tablet, Take 5 mg by mouth 2 (two) times daily., Disp: , Rfl:  .  oxyCODONE-acetaminophen (PERCOCET/ROXICET) 5-325 MG per tablet, 1-2 by mouth every 4 hours as needed **DO NOT EXCEED 4 GM OF TYLENOL IN 24 HOURS**,  Disp: 120 tablet, Rfl: 0 .  polyethylene glycol powder (GLYCOLAX/MIRALAX) powder, Take 17 g by mouth daily. Mix 1 capful (17 grams) with 8 oz of water and drink by mouth every other day., Disp: , Rfl:  .  potassium chloride 40 MEQ/15ML (20%) LIQD, Take 10 mEq by mouth daily. Take 7.5 mls (10 meq) by mouth daily., Disp: , Rfl:  .  potassium chloride SA (K-DUR,KLOR-CON) 20 MEQ tablet, , Disp: , Rfl:  .  solifenacin (VESICARE) 10 MG tablet, Take 10 mg by mouth., Disp: , Rfl:  .  tamsulosin (FLOMAX) 0.4 MG CAPS capsule, Take 0.4 mg by mouth 2 (two) times daily., Disp: , Rfl:  .  torsemide (DEMADEX) 20 MG tablet, Take 20 mg by mouth., Disp: , Rfl:   Social History   Tobacco Use  Smoking Status Never Smoker  Smokeless Tobacco Never Used    Allergies  Allergen Reactions  . Lithium Diarrhea and Other (See Comments)    Frequency in urination  Frequent urination   Objective:   Vitals:   11/19/17 1056  BP: 110/62  Pulse: 72  Resp: 16  Temp: (!) 96.2 F (35.7 C)   There is no height or weight on file to calculate BMI. Constitutional Well developed. Well nourished.  Vascular Dorsalis pedis pulses palpable bilaterally. Posterior tibial pulses non-palpable bilaterally. Capillary refill normal to all digits.  No cyanosis or clubbing noted. Pedal hair growth normal.  Neurologic Normal speech. Oriented to person, place, and time. Protective sensation absent  Dermatologic Wound Location: L hallux Wound Base: Granular Peri-wound: Calloused, macerated Exudate: Scant/small serous drainage Wound Measurements: -9/16: 1x1 -9/26: 0.5x0.5 -10/7: 0.5x0.5 -10/14: 0.3x0.3  Orthopedic: No pain to palpation either foot.   Assessment:   1. Diabetic ulcer of toe of left foot associated with type 2 diabetes mellitus, limited to breakdown of skin (HCC)   2. Diabetes mellitus type 2 with peripheral artery disease (HCC)   3. Onychomycosis    Plan:  Patient was evaluated and treated and all  questions answered.  Diabetes with PAD, Onychomycosis -Educated on diabetic footcare. Diabetic risk level 3 -Nails x10 debrided sharply and manually with large nail nipper and rotary burr.    Ulcer L hallux  -Debridement performed as below -Dressed with Silvadene and DSD -Facility continue dressing daily  Procedure: Excisional Debridement of Wound Rationale: Removal of non-viable soft tissue from the wound to promote healing.  Anesthesia: none Pre-Debridement Wound Measurements: Overlying hyperkeratosis Post-Debridement Wound Measurements: 0.3 cm x 0.3 cm x 0.1  cm  Type of Debridement: Sharp Excisional Tissue Removed: Non-viable soft tissue Depth of Debridement: subcutaneous tissue. Technique: Sharp excisional debridement to bleeding, viable wound base.  Dressing: Dry, sterile, compression dressing. Disposition: Patient tolerated procedure well. Patient to return in 1 week for follow-up.  Return in about 2 weeks (around 12/03/2017) for Wound Care, Left.

## 2017-11-20 ENCOUNTER — Ambulatory Visit: Payer: Medicare Other | Admitting: Podiatry

## 2017-12-03 ENCOUNTER — Ambulatory Visit (INDEPENDENT_AMBULATORY_CARE_PROVIDER_SITE_OTHER): Payer: Medicare HMO | Admitting: Podiatry

## 2017-12-03 DIAGNOSIS — L97521 Non-pressure chronic ulcer of other part of left foot limited to breakdown of skin: Secondary | ICD-10-CM | POA: Diagnosis not present

## 2017-12-03 DIAGNOSIS — E11621 Type 2 diabetes mellitus with foot ulcer: Secondary | ICD-10-CM

## 2017-12-03 NOTE — Progress Notes (Signed)
Subjective:  Patient ID: Henry Mcbride, male    DOB: 1945/10/09,  MRN: [id number]  Chief Complaint  Patient presents with  . Foot Ulcer    2 wks fu toe ulcer seems okay care giver states he is wearing his shoes and braces like he is supposed to     72 y.o. male presents for wound care. Above history confirmed with patient.  Review of Systems: Negative except as noted in the HPI. Denies N/V/F/Ch.  Past Medical History:  Diagnosis Date  . Anxiety   . Arthritis   . CHF (congestive heart failure) (HCC)    dilated non-ischemic cardiomyopathy; followed at [facility]  . CKD (chronic kidney disease)   . Cyst of kidney, acquired   . Depression   . Diabetes mellitus without complication (HCC)   . Hypertension   . Hypothyroidism   . Mental disorder   . Schizo affective schizophrenia (HCC)   . Seizures (HCC)    last was 3-4 years ago  . Shortness of breath   . Tardive dyskinesia     Current Outpatient Medications:  .  acetaminophen (TYLENOL) 325 MG tablet, Take 650 mg by mouth every 6 (six) hours as needed for pain., Disp: , Rfl:  .  amiodarone (PACERONE) 200 MG tablet, Take 200 mg by mouth., Disp: , Rfl:  .  aspirin 81 MG chewable tablet, Chew 81 mg by mouth., Disp: , Rfl:  .  atorvastatin (LIPITOR) 40 MG tablet, Take by mouth., Disp: , Rfl:  .  benztropine (COGENTIN) 2 MG tablet, Take 2 mg by mouth 2 (two) times daily., Disp: , Rfl:  .  benztropine (COGENTIN) 2 MG tablet, Take 2 mg by mouth., Disp: , Rfl:  .  carvedilol (COREG) 6.25 MG tablet, Take 6.25 mg by mouth 2 (two) times daily with a meal., Disp: , Rfl:  .  cyclobenzaprine (FLEXERIL) 10 MG tablet, Take 1 tablet (10 mg total) by mouth 3 (three) times daily as needed for muscle spasms., Disp: 30 tablet, Rfl: 0 .  divalproex (DEPAKOTE ER) 250 MG 24 hr tablet, Take 1,750 mg by mouth every evening. , Disp: , Rfl:  .  divalproex (DEPAKOTE) 500 MG DR tablet, Take by mouth., Disp: , Rfl:  .  docusate sodium  (COLACE) 100 MG capsule, Take 100 mg by mouth every other day., Disp: , Rfl:  .  docusate sodium (COLACE) 100 MG capsule, Take 100 mg by mouth., Disp: , Rfl:  .  donepezil (ARICEPT) 10 MG tablet, Take 10 mg by mouth at bedtime. , Disp: , Rfl:  .  doxycycline (VIBRA-TABS) 100 MG tablet, Take 1 tablet (100 mg total) by mouth 2 (two) times daily., Disp: 20 tablet, Rfl: 0 .  dutasteride (AVODART) 0.5 MG capsule, Take 0.5 mg by mouth daily., Disp: , Rfl:  .  ergocalciferol (VITAMIN D2) 50000 UNITS capsule, Take 50,000 Units by mouth once a week. Take 50,000 units on Fridays., Disp: , Rfl:  .  ferrous sulfate 324 (65 Fe) MG TBEC, Take 324 mg by mouth., Disp: , Rfl:  .  finasteride (PROSCAR) 5 MG tablet, Take 5 mg by mouth., Disp: , Rfl:  .  furosemide (LASIX) 20 MG tablet, Take 20 mg by mouth daily., Disp: , Rfl:  .  furosemide (LASIX) 40 MG tablet, Take 40 mg by mouth., Disp: , Rfl:  .  LANTUS 100 UNIT/ML injection, , Disp: , Rfl:  .  levothyroxine (SYNTHROID, LEVOTHROID) 125 MCG tablet, Take by mouth., Disp: , Rfl:  .  levothyroxine (SYNTHROID, LEVOTHROID) 137 MCG tablet, Take 137 mcg by mouth daily before breakfast., Disp: , Rfl:  .  linagliptin (TRADJENTA) 5 MG TABS tablet, Take 5 mg by mouth daily., Disp: , Rfl:  .  linagliptin (TRADJENTA) 5 MG TABS tablet, Take 5 mg by mouth., Disp: , Rfl:  .  magnesium oxide (MAG-OX) 400 MG tablet, Take 400 mg by mouth., Disp: , Rfl:  .  metoprolol succinate (TOPROL-XL) 25 MG 24 hr tablet, Take 25 mg by mouth., Disp: , Rfl:  .  OLANZapine (ZYPREXA) 7.5 MG tablet, Take 7.5 mg by mouth., Disp: , Rfl:  .  OLANZapine zydis (ZYPREXA) 10 MG disintegrating tablet, Take 10 mg by mouth at bedtime., Disp: , Rfl:  .  oxybutynin (DITROPAN) 5 MG tablet, Take 5 mg by mouth 2 (two) times daily., Disp: , Rfl:  .  oxyCODONE-acetaminophen (PERCOCET/ROXICET) 5-325 MG per tablet, 1-2 by mouth every 4 hours as needed **DO NOT EXCEED 4 GM OF TYLENOL IN 24 HOURS**, Disp: 120 tablet,  Rfl: 0 .  polyethylene glycol powder (GLYCOLAX/MIRALAX) powder, Take 17 g by mouth daily. Mix 1 capful (17 grams) with 8 oz of water and drink by mouth every other day., Disp: , Rfl:  .  potassium chloride 40 MEQ/15ML (20%) LIQD, Take 10 mEq by mouth daily. Take 7.5 mls (10 meq) by mouth daily., Disp: , Rfl:  .  potassium chloride SA (K-DUR,KLOR-CON) 20 MEQ tablet, , Disp: , Rfl:  .  solifenacin (VESICARE) 10 MG tablet, Take 10 mg by mouth., Disp: , Rfl:  .  tamsulosin (FLOMAX) 0.4 MG CAPS capsule, Take 0.4 mg by mouth 2 (two) times daily., Disp: , Rfl:  .  torsemide (DEMADEX) 20 MG tablet, Take 20 mg by mouth., Disp: , Rfl:   Social History   Tobacco Use  Smoking Status Never Smoker  Smokeless Tobacco Never Used    Allergies  Allergen Reactions  . Lithium Diarrhea and Other (See Comments)    Frequency in urination  Frequent urination   Objective:   There were no vitals filed for this visit. There is no height or weight on file to calculate BMI. Constitutional Well developed. Well nourished.  Vascular Dorsalis pedis pulses palpable bilaterally. Posterior tibial pulses non-palpable bilaterally. Capillary refill normal to all digits.  No cyanosis or clubbing noted. Pedal hair growth normal.  Neurologic Normal speech. Oriented to person, place, and time. Protective sensation absent  Dermatologic Wound Location: L hallux Wound Base: Granular Peri-wound: Calloused, macerated Exudate: Scant/small serous drainage Wound Measurements: -9/16: 1x1 -9/26: 0.5x0.5 -10/7: 0.5x0.5 -10/14: 0.3x0.3  Orthopedic: No pain to palpation either foot.   Assessment:   1. 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  Ulcer L hallux  -Debridement performed as below -Dressed with medihoney and DSD -Facility continue dressing daily  Procedure:  Excisional Debridement of Wound Rationale: Removal of non-viable soft tissue from the wound to promote healing.  Anesthesia: none Pre-Debridement Wound Measurements: overlying hyperkeratosis  Post-Debridement Wound Measurements: 0.2 cm x 0.2 cm x 0.1 cm  Type of Debridement: Sharp Excisional Tissue Removed: Non-viable soft tissue Depth of Debridement: subcutaneous tissue. Technique: Sharp excisional debridement to bleeding, viable wound base.  Dressing: Dry, sterile, compression dressing. Disposition: Patient tolerated procedure well. Patient to return in 1 week for follow-up.     No follow-ups on file.

## 2017-12-17 ENCOUNTER — Ambulatory Visit (INDEPENDENT_AMBULATORY_CARE_PROVIDER_SITE_OTHER): Payer: Medicare HMO | Admitting: Podiatry

## 2017-12-17 ENCOUNTER — Encounter: Payer: Self-pay | Admitting: Podiatry

## 2017-12-17 VITALS — BP 118/66 | HR 72 | Temp 96.9°F | Resp 16

## 2017-12-17 DIAGNOSIS — L97521 Non-pressure chronic ulcer of other part of left foot limited to breakdown of skin: Secondary | ICD-10-CM | POA: Diagnosis not present

## 2017-12-17 DIAGNOSIS — E11621 Type 2 diabetes mellitus with foot ulcer: Secondary | ICD-10-CM | POA: Diagnosis not present

## 2017-12-17 NOTE — Progress Notes (Signed)
Subjective:  Patient ID: Henry Mcbride, male    DOB: 09-16-1945,  MRN: [id number]  Chief Complaint  Patient presents with  . Foot Ulcer    F/U L hallux ulcer Pt. states," doing pretty good, but a little sore." Tx: none (only wearing braces) -pt denies N/V/F/Ch/drainage/swellling/redness -FBS: 84    72 y.o. male presents for wound care. Above history confirmed with patient.  Review of Systems: Negative except as noted in the HPI. Denies N/V/F/Ch.  Past Medical History:  Diagnosis Date  . Anxiety   . Arthritis   . CHF (congestive heart failure) (HCC)    dilated non-ischemic cardiomyopathy; followed at [facility]  . CKD (chronic kidney disease)   . Cyst of kidney, acquired   . Depression   . Diabetes mellitus without complication (HCC)   . Hypertension   . Hypothyroidism   . Mental disorder   . Schizo affective schizophrenia (HCC)   . Seizures (HCC)    last was 3-4 years ago  . Shortness of breath   . Tardive dyskinesia     Current Outpatient Medications:  .  acetaminophen (TYLENOL) 325 MG tablet, Take 650 mg by mouth every 6 (six) hours as needed for pain., Disp: , Rfl:  .  amiodarone (PACERONE) 200 MG tablet, Take 200 mg by mouth., Disp: , Rfl:  .  aspirin 81 MG chewable tablet, Chew 81 mg by mouth., Disp: , Rfl:  .  atorvastatin (LIPITOR) 40 MG tablet, Take by mouth., Disp: , Rfl:  .  benztropine (COGENTIN) 2 MG tablet, Take 2 mg by mouth 2 (two) times daily., Disp: , Rfl:  .  benztropine (COGENTIN) 2 MG tablet, Take 2 mg by mouth., Disp: , Rfl:  .  carvedilol (COREG) 6.25 MG tablet, Take 6.25 mg by mouth 2 (two) times daily with a meal., Disp: , Rfl:  .  cyclobenzaprine (FLEXERIL) 10 MG tablet, Take 1 tablet (10 mg total) by mouth 3 (three) times daily as needed for muscle spasms., Disp: 30 tablet, Rfl: 0 .  divalproex (DEPAKOTE ER) 250 MG 24 hr tablet, Take 1,750 mg by mouth every evening. , Disp: , Rfl:  .  divalproex (DEPAKOTE) 500 MG DR tablet,  Take by mouth., Disp: , Rfl:  .  docusate sodium (COLACE) 100 MG capsule, Take 100 mg by mouth every other day., Disp: , Rfl:  .  docusate sodium (COLACE) 100 MG capsule, Take 100 mg by mouth., Disp: , Rfl:  .  donepezil (ARICEPT) 10 MG tablet, Take 10 mg by mouth at bedtime. , Disp: , Rfl:  .  doxycycline (VIBRA-TABS) 100 MG tablet, Take 1 tablet (100 mg total) by mouth 2 (two) times daily., Disp: 20 tablet, Rfl: 0 .  dutasteride (AVODART) 0.5 MG capsule, Take 0.5 mg by mouth daily., Disp: , Rfl:  .  ergocalciferol (VITAMIN D2) 50000 UNITS capsule, Take 50,000 Units by mouth once a week. Take 50,000 units on Fridays., Disp: , Rfl:  .  ferrous sulfate 324 (65 Fe) MG TBEC, Take 324 mg by mouth., Disp: , Rfl:  .  finasteride (PROSCAR) 5 MG tablet, Take 5 mg by mouth., Disp: , Rfl:  .  furosemide (LASIX) 20 MG tablet, Take 20 mg by mouth daily., Disp: , Rfl:  .  furosemide (LASIX) 40 MG tablet, Take 40 mg by mouth., Disp: , Rfl:  .  LANTUS 100 UNIT/ML injection, , Disp: , Rfl:  .  levothyroxine (SYNTHROID, LEVOTHROID) 125 MCG tablet, Take by mouth., Disp: , Rfl:  .  levothyroxine (SYNTHROID, LEVOTHROID) 137 MCG tablet, Take 137 mcg by mouth daily before breakfast., Disp: , Rfl:  .  linagliptin (TRADJENTA) 5 MG TABS tablet, Take 5 mg by mouth daily., Disp: , Rfl:  .  linagliptin (TRADJENTA) 5 MG TABS tablet, Take 5 mg by mouth., Disp: , Rfl:  .  magnesium oxide (MAG-OX) 400 MG tablet, Take 400 mg by mouth., Disp: , Rfl:  .  metoprolol succinate (TOPROL-XL) 25 MG 24 hr tablet, Take 25 mg by mouth., Disp: , Rfl:  .  OLANZapine (ZYPREXA) 7.5 MG tablet, Take 7.5 mg by mouth., Disp: , Rfl:  .  OLANZapine zydis (ZYPREXA) 10 MG disintegrating tablet, Take 10 mg by mouth at bedtime., Disp: , Rfl:  .  oxybutynin (DITROPAN) 5 MG tablet, Take 5 mg by mouth 2 (two) times daily., Disp: , Rfl:  .  oxyCODONE-acetaminophen (PERCOCET/ROXICET) 5-325 MG per tablet, 1-2 by mouth every 4 hours as needed **DO NOT EXCEED  4 GM OF TYLENOL IN 24 HOURS**, Disp: 120 tablet, Rfl: 0 .  polyethylene glycol powder (GLYCOLAX/MIRALAX) powder, Take 17 g by mouth daily. Mix 1 capful (17 grams) with 8 oz of water and drink by mouth every other day., Disp: , Rfl:  .  potassium chloride 40 MEQ/15ML (20%) LIQD, Take 10 mEq by mouth daily. Take 7.5 mls (10 meq) by mouth daily., Disp: , Rfl:  .  potassium chloride SA (K-DUR,KLOR-CON) 20 MEQ tablet, , Disp: , Rfl:  .  solifenacin (VESICARE) 10 MG tablet, Take 10 mg by mouth., Disp: , Rfl:  .  tamsulosin (FLOMAX) 0.4 MG CAPS capsule, Take 0.4 mg by mouth 2 (two) times daily., Disp: , Rfl:  .  torsemide (DEMADEX) 20 MG tablet, Take 20 mg by mouth., Disp: , Rfl:   Social History   Tobacco Use  Smoking Status Never Smoker  Smokeless Tobacco Never Used    Allergies  Allergen Reactions  . Lithium Diarrhea and Other (See Comments)    Frequency in urination  Frequent urination   Objective:   Vitals:   12/17/17 1040  BP: 118/66  Pulse: 72  Resp: 16  Temp: (!) 96.9 F (36.1 C)   There is no height or weight on file to calculate BMI. Constitutional Well developed. Well nourished.  Vascular Dorsalis pedis pulses palpable bilaterally. Posterior tibial pulses non-palpable bilaterally. Capillary refill normal to all digits.  No cyanosis or clubbing noted. Pedal hair growth normal.  Neurologic Normal speech. Oriented to person, place, and time. Protective sensation absent  Dermatologic Wound Location: L hallux Wound Base: Granular Peri-wound: Calloused, macerated Exudate: Scant/small serous drainage Wound Measurements: -9/16: 1x1 -9/26: 0.5x0.5 -10/7: 0.5x0.5 -10/14: 0.3x0.3 -11/11: 0.5x0.5  Orthopedic: No pain to palpation either foot.   Assessment:   1. 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  Ulcer L hallux    -Debridement performed as below -Dressed with medihoney and DSD -Facility continue dressing daily  Procedure: Excisional Debridement of Wound Rationale: Removal of non-viable soft tissue from the wound to promote healing.  Anesthesia: none Pre-Debridement Wound Measurements: overlying hyperkeratosis  Post-Debridement Wound Measurements: 0.5 cm x 0.5 cm x 0.1 cm  Type of Debridement: Sharp Excisional Tissue Removed: Non-viable soft tissue Depth of Debridement: subcutaneous tissue. Technique: Sharp excisional debridement to bleeding, viable wound base.  Dressing: Dry, sterile, compression dressing. Disposition: Patient tolerated procedure well. Patient  to return in 1 week for follow-up.     No follow-ups on file.

## 2017-12-21 NOTE — Progress Notes (Signed)
Patient ID: Henry Mcbride, male   DOB: 10-02-1945, 72 y.o.   MRN: [id number]   Patient presents at Dr Price's request to be measured for diabetic [facility]  Patient will be called when shoes and inserts arrive to schedule a fitting.

## 2018-01-01 ENCOUNTER — Ambulatory Visit (INDEPENDENT_AMBULATORY_CARE_PROVIDER_SITE_OTHER): Payer: Medicare HMO | Admitting: Podiatry

## 2018-01-01 DIAGNOSIS — L97521 Non-pressure chronic ulcer of other part of left foot limited to breakdown of skin: Secondary | ICD-10-CM

## 2018-01-01 DIAGNOSIS — L97811 Non-pressure chronic ulcer of other part of right lower leg limited to breakdown of skin: Secondary | ICD-10-CM

## 2018-01-01 DIAGNOSIS — E11621 Type 2 diabetes mellitus with foot ulcer: Secondary | ICD-10-CM | POA: Diagnosis not present

## 2018-01-01 DIAGNOSIS — I872 Venous insufficiency (chronic) (peripheral): Secondary | ICD-10-CM

## 2018-01-02 ENCOUNTER — Telehealth: Payer: Self-pay | Admitting: *Deleted

## 2018-01-02 DIAGNOSIS — R600 Localized edema: Secondary | ICD-10-CM

## 2018-01-02 DIAGNOSIS — L97811 Non-pressure chronic ulcer of other part of right lower leg limited to breakdown of skin: Principal | ICD-10-CM

## 2018-01-02 DIAGNOSIS — I872 Venous insufficiency (chronic) (peripheral): Secondary | ICD-10-CM

## 2018-01-02 NOTE — Telephone Encounter (Signed)
-----   Message from Park LiterMichael J Price, DPM sent at 01/02/2018 10:10 AM EST ----- Can we order venous reflux studies RLE? R/o DVT

## 2018-01-02 NOTE — Progress Notes (Signed)
Subjective:  Patient ID: Henry Mcbride, male    DOB: 12-21-1945,  MRN: [id number]  Chief Complaint  Patient presents with  . Foot Ulcer    FU L hallux ulcer Pt. states," looks about the same toe met ,but it's sore and the sore at the R ankle feels like needle sticks (3/10)." -w/ light bloody drainage -pt denies N/v/F?CH/swelling     72 y.o. male presents for wound care. Above history confirmed with patient.  Review of Systems: Negative except as noted in the HPI. Denies N/V/F/Ch.  Past Medical History:  Diagnosis Date  . Anxiety   . Arthritis   . CHF (congestive heart failure) (HCC)    dilated non-ischemic cardiomyopathy; followed at [facility]  . CKD (chronic kidney disease)   . Cyst of kidney, acquired   . Depression   . Diabetes mellitus without complication ([place])   . Hypertension   . Hypothyroidism   . Mental disorder   . Schizo affective schizophrenia ([place])   . Seizures ([place])    last was 3-4 years ago  . Shortness of breath   . Tardive dyskinesia     Current Outpatient Medications:  .  acetaminophen (TYLENOL) 325 MG tablet, Take 650 mg by mouth every 6 (six) hours as needed for pain., Disp: , Rfl:  .  amiodarone (PACERONE) 200 MG tablet, Take 200 mg by mouth., Disp: , Rfl:  .  aspirin 81 MG chewable tablet, Chew 81 mg by mouth., Disp: , Rfl:  .  atorvastatin (LIPITOR) 40 MG tablet, Take by mouth., Disp: , Rfl:  .  benztropine (COGENTIN) 2 MG tablet, Take 2 mg by mouth 2 (two) times daily., Disp: , Rfl:  .  benztropine (COGENTIN) 2 MG tablet, Take 2 mg by mouth., Disp: , Rfl:  .  carvedilol (COREG) 6.25 MG tablet, Take 6.25 mg by mouth 2 (two) times daily with a meal., Disp: , Rfl:  .  cyclobenzaprine (FLEXERIL) 10 MG tablet, Take 1 tablet (10 mg total) by mouth 3 (three) times daily as needed for muscle spasms., Disp: 30 tablet, Rfl: 0 .  divalproex (DEPAKOTE ER) 250 MG 24 hr tablet, Take 1,750 mg by mouth every evening. , Disp: , Rfl:  .   divalproex (DEPAKOTE) 500 MG DR tablet, Take by mouth., Disp: , Rfl:  .  docusate sodium (COLACE) 100 MG capsule, Take 100 mg by mouth every other day., Disp: , Rfl:  .  docusate sodium (COLACE) 100 MG capsule, Take 100 mg by mouth., Disp: , Rfl:  .  donepezil (ARICEPT) 10 MG tablet, Take 10 mg by mouth at bedtime. , Disp: , Rfl:  .  doxycycline (VIBRA-TABS) 100 MG tablet, Take 1 tablet (100 mg total) by mouth 2 (two) times daily., Disp: 20 tablet, Rfl: 0 .  dutasteride (AVODART) 0.5 MG capsule, Take 0.5 mg by mouth daily., Disp: , Rfl:  .  ergocalciferol (VITAMIN D2) 50000 UNITS capsule, Take 50,000 Units by mouth once a week. Take 50,000 units on Fridays., Disp: , Rfl:  .  ferrous sulfate 324 (65 Fe) MG TBEC, Take 324 mg by mouth., Disp: , Rfl:  .  finasteride (PROSCAR) 5 MG tablet, Take 5 mg by mouth., Disp: , Rfl:  .  furosemide (LASIX) 20 MG tablet, Take 20 mg by mouth daily., Disp: , Rfl:  .  furosemide (LASIX) 40 MG tablet, Take 40 mg by mouth., Disp: , Rfl:  .  LANTUS 100 UNIT/ML injection, , Disp: , Rfl:  .  levothyroxine (  SYNTHROID, LEVOTHROID) 125 MCG tablet, Take by mouth., Disp: , Rfl:  .  levothyroxine (SYNTHROID, LEVOTHROID) 137 MCG tablet, Take 137 mcg by mouth daily before breakfast., Disp: , Rfl:  .  linagliptin (TRADJENTA) 5 MG TABS tablet, Take 5 mg by mouth daily., Disp: , Rfl:  .  linagliptin (TRADJENTA) 5 MG TABS tablet, Take 5 mg by mouth., Disp: , Rfl:  .  magnesium oxide (MAG-OX) 400 MG tablet, Take 400 mg by mouth., Disp: , Rfl:  .  metoprolol succinate (TOPROL-XL) 25 MG 24 hr tablet, Take 25 mg by mouth., Disp: , Rfl:  .  OLANZapine (ZYPREXA) 7.5 MG tablet, Take 7.5 mg by mouth., Disp: , Rfl:  .  OLANZapine zydis (ZYPREXA) 10 MG disintegrating tablet, Take 10 mg by mouth at bedtime., Disp: , Rfl:  .  oxybutynin (DITROPAN) 5 MG tablet, Take 5 mg by mouth 2 (two) times daily., Disp: , Rfl:  .  oxyCODONE-acetaminophen (PERCOCET/ROXICET) 5-325 MG per tablet, 1-2 by mouth  every 4 hours as needed **DO NOT EXCEED 4 GM OF TYLENOL IN 24 HOURS**, Disp: 120 tablet, Rfl: 0 .  polyethylene glycol powder (GLYCOLAX/MIRALAX) powder, Take 17 g by mouth daily. Mix 1 capful (17 grams) with 8 oz of water and drink by mouth every other day., Disp: , Rfl:  .  potassium chloride 40 MEQ/15ML (20%) LIQD, Take 10 mEq by mouth daily. Take 7.5 mls (10 meq) by mouth daily., Disp: , Rfl:  .  potassium chloride SA (K-DUR,KLOR-CON) 20 MEQ tablet, , Disp: , Rfl:  .  solifenacin (VESICARE) 10 MG tablet, Take 10 mg by mouth., Disp: , Rfl:  .  tamsulosin (FLOMAX) 0.4 MG CAPS capsule, Take 0.4 mg by mouth 2 (two) times daily., Disp: , Rfl:  .  torsemide (DEMADEX) 20 MG tablet, Take 20 mg by mouth., Disp: , Rfl:   Social History   Tobacco Use  Smoking Status Never Smoker  Smokeless Tobacco Never Used    Allergies  Allergen Reactions  . Lithium Diarrhea and Other (See Comments)    Frequency in urination  Frequent urination   Objective:   There were no vitals filed for this visit. There is no height or weight on file to calculate BMI. Constitutional Well developed. Well nourished.  Vascular Dorsalis pedis pulses palpable bilaterally. Posterior tibial pulses non-palpable bilaterally. Capillary refill normal to all digits.  No cyanosis or clubbing noted. Pedal hair growth normal. R leg edema with calf pain prominent varicosities.  Neurologic Normal speech. Oriented to person, place, and time. Protective sensation absent  Dermatologic Wound Location: L hallux Wound Base: Granular Peri-wound: Calloused, macerated Exudate: Scant/small serous drainage Wound Measurements: -9/16: 1x1 -9/26: 0.5x0.5 -10/7: 0.5x0.5 -10/14: 0.3x0.3 -11/11: 0.5x0.5 -11/26: 0.5x0.5  Orthopedic: No pain to palpation either foot.   Assessment:   No diagnosis found. Plan:  Patient was evaluated and treated and all questions answered.  Ulcer L hallux  -Debridement performed as below -Dressed  with medihoney and DSD -Facility continue dressing daily  Procedure: Excisional Debridement of Wound Rationale: Removal of non-viable soft tissue from the wound to promote healing.  Anesthesia: none Pre-Debridement Wound Measurements: overlying hyperkeratosis  Post-Debridement Wound Measurements: 0.5 cm x 0.5 cm x 0.1 cm  Type of Debridement: Sharp Excisional Tissue Removed: Non-viable soft tissue Depth of Debridement: subcutaneous tissue. Technique: Sharp excisional debridement to bleeding, viable wound base.  Dressing: Dry, sterile, compression dressing. Disposition: Patient tolerated procedure well. Patient to return in 1 week for follow-up.  Venous Insufficiency R -Apply  multilayer compression wrap -Order venous reflux r/o DVT  Procedure: Multilayer Compression dressing Rationale: venous insufficiency Technique: Unna boot, cast padding, Coban compression dressing applied Disposition: Patient tolerated procedure well.    Return in about 2 weeks (around 01/15/2018) for wound care, right leg edema f/u.

## 2018-01-14 ENCOUNTER — Ambulatory Visit (INDEPENDENT_AMBULATORY_CARE_PROVIDER_SITE_OTHER): Payer: Medicare HMO | Admitting: Podiatry

## 2018-01-14 ENCOUNTER — Encounter: Payer: Self-pay | Admitting: Podiatry

## 2018-01-14 VITALS — BP 108/62 | HR 83 | Temp 98.5°F | Resp 16

## 2018-01-14 DIAGNOSIS — E11621 Type 2 diabetes mellitus with foot ulcer: Secondary | ICD-10-CM

## 2018-01-14 DIAGNOSIS — L97521 Non-pressure chronic ulcer of other part of left foot limited to breakdown of skin: Secondary | ICD-10-CM

## 2018-01-15 NOTE — Progress Notes (Signed)
Subjective:  Patient ID: Henry Mcbride, male    DOB: [birth year],  MRN: [id number]  Chief Complaint  Patient presents with  . Foot Ulcer    F/U L hallux ulcer Pt. states," it's doing fine but I have a new spot on my L leg." pt denies N/V/F?CH    72 y.o. male presents for wound care. Above history confirmed with patient.  Review of Systems: Negative except as noted in the HPI. Denies N/V/F/Ch.  Past Medical History:  Diagnosis Date  . Anxiety   . Arthritis   . CHF (congestive heart failure) (HCC)    dilated non-ischemic cardiomyopathy; followed at [facility]  . CKD (chronic kidney disease)   . Cyst of kidney, acquired   . Depression   . Diabetes mellitus without complication (HCC)   . Hypertension   . Hypothyroidism   . Mental disorder   . Schizo affective schizophrenia (HCC)   . Seizures (HCC)    last was 3-4 years ago  . Shortness of breath   . Tardive dyskinesia     Current Outpatient Medications:  .  acetaminophen (TYLENOL) 325 MG tablet, Take 650 mg by mouth every 6 (six) hours as needed for pain., Disp: , Rfl:  .  amiodarone (PACERONE) 200 MG tablet, Take 200 mg by mouth., Disp: , Rfl:  .  aspirin 81 MG chewable tablet, Chew 81 mg by mouth., Disp: , Rfl:  .  atorvastatin (LIPITOR) 40 MG tablet, Take by mouth., Disp: , Rfl:  .  benztropine (COGENTIN) 2 MG tablet, Take 2 mg by mouth 2 (two) times daily., Disp: , Rfl:  .  benztropine (COGENTIN) 2 MG tablet, Take 2 mg by mouth., Disp: , Rfl:  .  carvedilol (COREG) 6.25 MG tablet, Take 6.25 mg by mouth 2 (two) times daily with a meal., Disp: , Rfl:  .  cyclobenzaprine (FLEXERIL) 10 MG tablet, Take 1 tablet (10 mg total) by mouth 3 (three) times daily as needed for muscle spasms., Disp: 30 tablet, Rfl: 0 .  divalproex (DEPAKOTE ER) 250 MG 24 hr tablet, Take 1,750 mg by mouth every evening. , Disp: , Rfl:  .  divalproex (DEPAKOTE) 500 MG DR tablet, Take by mouth., Disp: , Rfl:  .  docusate sodium  (COLACE) 100 MG capsule, Take 100 mg by mouth every other day., Disp: , Rfl:  .  docusate sodium (COLACE) 100 MG capsule, Take 100 mg by mouth., Disp: , Rfl:  .  donepezil (ARICEPT) 10 MG tablet, Take 10 mg by mouth at bedtime. , Disp: , Rfl:  .  doxycycline (VIBRA-TABS) 100 MG tablet, Take 1 tablet (100 mg total) by mouth 2 (two) times daily., Disp: 20 tablet, Rfl: 0 .  dutasteride (AVODART) 0.5 MG capsule, Take 0.5 mg by mouth daily., Disp: , Rfl:  .  ergocalciferol (VITAMIN D2) 50000 UNITS capsule, Take 50,000 Units by mouth once a week. Take 50,000 units on Fridays., Disp: , Rfl:  .  ferrous sulfate 324 (65 Fe) MG TBEC, Take 324 mg by mouth., Disp: , Rfl:  .  finasteride (PROSCAR) 5 MG tablet, Take 5 mg by mouth., Disp: , Rfl:  .  furosemide (LASIX) 20 MG tablet, Take 20 mg by mouth daily., Disp: , Rfl:  .  furosemide (LASIX) 40 MG tablet, Take 40 mg by mouth., Disp: , Rfl:  .  gabapentin (NEURONTIN) 100 MG capsule, , Disp: , Rfl:  .  LANTUS 100 UNIT/ML injection, , Disp: , Rfl:  .  levothyroxine (SYNTHROID,  LEVOTHROID) 125 MCG tablet, Take by mouth., Disp: , Rfl:  .  levothyroxine (SYNTHROID, LEVOTHROID) 137 MCG tablet, Take 137 mcg by mouth daily before breakfast., Disp: , Rfl:  .  linagliptin (TRADJENTA) 5 MG TABS tablet, Take 5 mg by mouth daily., Disp: , Rfl:  .  linagliptin (TRADJENTA) 5 MG TABS tablet, Take 5 mg by mouth., Disp: , Rfl:  .  magnesium oxide (MAG-OX) 400 MG tablet, Take 400 mg by mouth., Disp: , Rfl:  .  metoprolol succinate (TOPROL-XL) 25 MG 24 hr tablet, Take 25 mg by mouth., Disp: , Rfl:  .  OLANZapine (ZYPREXA) 7.5 MG tablet, Take 7.5 mg by mouth., Disp: , Rfl:  .  OLANZapine zydis (ZYPREXA) 10 MG disintegrating tablet, Take 10 mg by mouth at bedtime., Disp: , Rfl:  .  oxybutynin (DITROPAN) 5 MG tablet, Take 5 mg by mouth 2 (two) times daily., Disp: , Rfl:  .  oxybutynin (DITROPAN-XL) 10 MG 24 hr tablet, , Disp: , Rfl:  .  oxyCODONE-acetaminophen (PERCOCET/ROXICET)  5-325 MG per tablet, 1-2 by mouth every 4 hours as needed **DO NOT EXCEED 4 GM OF TYLENOL IN 24 HOURS**, Disp: 120 tablet, Rfl: 0 .  pantoprazole (PROTONIX) 20 MG tablet, , Disp: , Rfl:  .  polyethylene glycol powder (GLYCOLAX/MIRALAX) powder, Take 17 g by mouth daily. Mix 1 capful (17 grams) with 8 oz of water and drink by mouth every other day., Disp: , Rfl:  .  potassium chloride 40 MEQ/15ML (20%) LIQD, Take 10 mEq by mouth daily. Take 7.5 mls (10 meq) by mouth daily., Disp: , Rfl:  .  potassium chloride SA (K-DUR,KLOR-CON) 20 MEQ tablet, , Disp: , Rfl:  .  ranitidine (ZANTAC) 150 MG tablet, , Disp: , Rfl:  .  solifenacin (VESICARE) 10 MG tablet, Take 10 mg by mouth., Disp: , Rfl:  .  tamsulosin (FLOMAX) 0.4 MG CAPS capsule, Take 0.4 mg by mouth 2 (two) times daily., Disp: , Rfl:  .  torsemide (DEMADEX) 20 MG tablet, Take 20 mg by mouth., Disp: , Rfl:   Social History   Tobacco Use  Smoking Status Never Smoker  Smokeless Tobacco Never Used    Allergies  Allergen Reactions  . Lithium Diarrhea and Other (See Comments)    Frequency in urination  Frequent urination   Objective:   Vitals:   01/14/18 1156  BP: 108/62  Pulse: 83  Resp: 16  Temp: 98.5 F (36.9 C)   There is no height or weight on file to calculate BMI. Constitutional Well developed. Well nourished.  Vascular Dorsalis pedis pulses palpable bilaterally. Posterior tibial pulses non-palpable bilaterally. Capillary refill normal to all digits.  No cyanosis or clubbing noted. Pedal hair growth normal. R leg edema with calf pain prominent varicosities.  Neurologic Normal speech. Oriented to person, place, and time. Protective sensation absent  Dermatologic Wound Location: L hallux Wound Base: Granular Peri-wound: Calloused, macerated Exudate: Scant/small serous drainage Wound Measurements: -9/16: 1x1 -9/26: 0.5x0.5 -10/7: 0.5x0.5 -10/14: 0.3x0.3 -11/11: 0.5x0.5 -11/26: 0.5x0.5 -129: 0.x5x0.5  Orthopedic:  No pain to palpation either foot.   Assessment:   1. 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  Ulcer L hallux  -Debridement performed as below -Dressed with medihoney and DSD -Facility continue dressing daily -We will adjust AFO to lengthen the footplate to assist in offloading the ulceration  Procedure: Excisional Debridement of Wound Rationale: Removal of non-viable soft tissue from the wound to promote healing.  Anesthesia: none Pre-Debridement Wound Measurements: overlying hypekeratosis  Post-Debridement Wound Measurements: 0.5 cm x 0.5 cm x 0.1 cm  Type of Debridement: Sharp Excisional Tissue Removed: Non-viable soft tissue Depth of Debridement: subcutaneous tissue. Technique: Sharp excisional debridement to bleeding, viable wound base.  Dressing: Dry, sterile, compression dressing. Disposition: Patient tolerated procedure well. Patient to return in 1 week for follow-up.   Ope leg wound -Secondary to irritation from the brace.  Will make adjustment with orthotist -Antibiotic ointment and Band-Aid applied  No follow-ups on file.

## 2018-02-04 DIAGNOSIS — J189 Pneumonia, unspecified organism: Secondary | ICD-10-CM

## 2018-02-04 DIAGNOSIS — I2510 Atherosclerotic heart disease of native coronary artery without angina pectoris: Secondary | ICD-10-CM

## 2018-02-04 DIAGNOSIS — R269 Unspecified abnormalities of gait and mobility: Secondary | ICD-10-CM

## 2018-02-04 DIAGNOSIS — W19XXXA Unspecified fall, initial encounter: Secondary | ICD-10-CM

## 2018-02-04 DIAGNOSIS — E119 Type 2 diabetes mellitus without complications: Secondary | ICD-10-CM | POA: Diagnosis not present

## 2018-02-04 DIAGNOSIS — Z95810 Presence of automatic (implantable) cardiac defibrillator: Secondary | ICD-10-CM

## 2018-02-04 DIAGNOSIS — N183 Chronic kidney disease, stage 3 (moderate): Secondary | ICD-10-CM

## 2018-02-04 DIAGNOSIS — I5020 Unspecified systolic (congestive) heart failure: Secondary | ICD-10-CM

## 2018-02-05 DIAGNOSIS — R269 Unspecified abnormalities of gait and mobility: Secondary | ICD-10-CM | POA: Diagnosis not present

## 2018-02-05 DIAGNOSIS — N183 Chronic kidney disease, stage 3 (moderate): Secondary | ICD-10-CM | POA: Diagnosis not present

## 2018-02-05 DIAGNOSIS — E119 Type 2 diabetes mellitus without complications: Secondary | ICD-10-CM | POA: Diagnosis not present

## 2018-02-05 DIAGNOSIS — W19XXXA Unspecified fall, initial encounter: Secondary | ICD-10-CM | POA: Diagnosis not present

## 2018-02-19 ENCOUNTER — Encounter: Payer: Self-pay | Admitting: Podiatry

## 2018-02-19 ENCOUNTER — Ambulatory Visit (INDEPENDENT_AMBULATORY_CARE_PROVIDER_SITE_OTHER): Payer: Medicare HMO | Admitting: Podiatry

## 2018-02-19 VITALS — BP 125/72 | HR 71 | Temp 97.4°F | Resp 16

## 2018-02-19 DIAGNOSIS — L97811 Non-pressure chronic ulcer of other part of right lower leg limited to breakdown of skin: Secondary | ICD-10-CM

## 2018-02-19 DIAGNOSIS — I872 Venous insufficiency (chronic) (peripheral): Secondary | ICD-10-CM

## 2018-02-19 DIAGNOSIS — E11621 Type 2 diabetes mellitus with foot ulcer: Secondary | ICD-10-CM

## 2018-02-19 DIAGNOSIS — L97521 Non-pressure chronic ulcer of other part of left foot limited to breakdown of skin: Secondary | ICD-10-CM | POA: Diagnosis not present

## 2018-02-19 DIAGNOSIS — M1990 Unspecified osteoarthritis, unspecified site: Secondary | ICD-10-CM | POA: Insufficient documentation

## 2018-02-19 DIAGNOSIS — R1310 Dysphagia, unspecified: Secondary | ICD-10-CM | POA: Insufficient documentation

## 2018-02-19 DIAGNOSIS — N400 Enlarged prostate without lower urinary tract symptoms: Secondary | ICD-10-CM | POA: Insufficient documentation

## 2018-02-19 MED ORDER — SILVER SULFADIAZINE 1 % EX CREA: TOPICAL_CREAM | CUTANEOUS | 0 refills | Status: AC

## 2018-02-19 MED ORDER — SILVER SULFADIAZINE 1 % EX CREA: TOPICAL_CREAM | CUTANEOUS | 0 refills | Status: DC

## 2018-02-19 NOTE — Progress Notes (Signed)
Subjective:  Patient ID: Henry Mcbride, male    DOB: 17-Jun-1945,  MRN: [id number]  Chief Complaint  Patient presents with  . Wound Check    R leg medial side x 3 wks; 3/10 burning senstation Pt. states," ankle brace rubbed it and it made it bigger and bigger.:" Tx: abx salve and bandaid -pt denies N/V/?FCh -FBS: 157     73 y.o. male presents for wound care. Above history confirmed with patient.  Review of Systems: Negative except as noted in the HPI. Denies N/V/F/Ch.  Past Medical History:  Diagnosis Date  . Anxiety   . Arthritis   . CHF (congestive heart failure) (HCC)    dilated non-ischemic cardiomyopathy; [facility]  . CKD (chronic kidney disease)   . Cyst of kidney, acquired   . Depression   . Diabetes mellitus without complication (HCC)   . Hypertension   . Hypothyroidism   . Mental disorder   . Schizo affective schizophrenia (HCC)   . Seizures (HCC)    last was 3-4 years ago  . Shortness of breath   . Tardive dyskinesia     Current Outpatient Medications:  .  acetaminophen (TYLENOL) 325 MG tablet, Take 650 mg by mouth every 6 (six) hours as needed for pain., Disp: , Rfl:  .  amiodarone (PACERONE) 200 MG tablet, Take 200 mg by mouth., Disp: , Rfl:  .  aspirin 81 MG chewable tablet, Chew 81 mg by mouth., Disp: , Rfl:  .  atorvastatin (LIPITOR) 40 MG tablet, Take by mouth., Disp: , Rfl:  .  benztropine (COGENTIN) 2 MG tablet, Take 2 mg by mouth 2 (two) times daily., Disp: , Rfl:  .  benztropine (COGENTIN) 2 MG tablet, Take 2 mg by mouth., Disp: , Rfl:  .  carvedilol (COREG) 6.25 MG tablet, Take 6.25 mg by mouth 2 (two) times daily with a meal., Disp: , Rfl:  .  cyclobenzaprine (FLEXERIL) 10 MG tablet, Take 1 tablet (10 mg total) by mouth 3 (three) times daily as needed for muscle spasms., Disp: 30 tablet, Rfl: 0 .  divalproex (DEPAKOTE ER) 250 MG 24 hr tablet, Take 1,750 mg by mouth every evening. , Disp: , Rfl:  .  divalproex  (DEPAKOTE) 500 MG DR tablet, Take by mouth., Disp: , Rfl:  .  docusate sodium (COLACE) 100 MG capsule, Take 100 mg by mouth every other day., Disp: , Rfl:  .  docusate sodium (COLACE) 100 MG capsule, Take 100 mg by mouth., Disp: , Rfl:  .  donepezil (ARICEPT) 10 MG tablet, Take 10 mg by mouth at bedtime. , Disp: , Rfl:  .  doxycycline (VIBRA-TABS) 100 MG tablet, Take 1 tablet (100 mg total) by mouth 2 (two) times daily., Disp: 20 tablet, Rfl: 0 .  dutasteride (AVODART) 0.5 MG capsule, Take 0.5 mg by mouth daily., Disp: , Rfl:  .  ergocalciferol (VITAMIN D2) 50000 UNITS capsule, Take 50,000 Units by mouth once a week. Take 50,000 units on Fridays., Disp: , Rfl:  .  ferrous sulfate 324 (65 Fe) MG TBEC, Take 324 mg by mouth., Disp: , Rfl:  .  finasteride (PROSCAR) 5 MG tablet, Take 5 mg by mouth., Disp: , Rfl:  .  furosemide (LASIX) 20 MG tablet, Take 20 mg by mouth daily., Disp: , Rfl:  .  furosemide (LASIX) 40 MG tablet, Take 40 mg by mouth., Disp: , Rfl:  .  gabapentin (NEURONTIN) 100 MG capsule, , Disp: , Rfl:  .  LANTUS  100 UNIT/ML injection, , Disp: , Rfl:  .  levothyroxine (SYNTHROID, LEVOTHROID) 125 MCG tablet, Take by mouth., Disp: , Rfl:  .  levothyroxine (SYNTHROID, LEVOTHROID) 137 MCG tablet, Take 137 mcg by mouth daily before breakfast., Disp: , Rfl:  .  linagliptin (TRADJENTA) 5 MG TABS tablet, Take 5 mg by mouth daily., Disp: , Rfl:  .  linagliptin (TRADJENTA) 5 MG TABS tablet, Take 5 mg by mouth., Disp: , Rfl:  .  magnesium oxide (MAG-OX) 400 MG tablet, Take 400 mg by mouth., Disp: , Rfl:  .  metoprolol succinate (TOPROL-XL) 25 MG 24 hr tablet, Take 25 mg by mouth., Disp: , Rfl:  .  OLANZapine (ZYPREXA) 7.5 MG tablet, Take 7.5 mg by mouth., Disp: , Rfl:  .  OLANZapine zydis (ZYPREXA) 10 MG disintegrating tablet, Take 10 mg by mouth at bedtime., Disp: , Rfl:  .  oxybutynin (DITROPAN) 5 MG tablet, Take 5 mg by mouth 2 (two) times daily., Disp: , Rfl:  .  oxybutynin (DITROPAN-XL) 10  MG 24 hr tablet, , Disp: , Rfl:  .  oxyCODONE-acetaminophen (PERCOCET/ROXICET) 5-325 MG per tablet, 1-2 by mouth every 4 hours as needed **DO NOT EXCEED 4 GM OF TYLENOL IN 24 HOURS**, Disp: 120 tablet, Rfl: 0 .  pantoprazole (PROTONIX) 20 MG tablet, , Disp: , Rfl:  .  polyethylene glycol powder (GLYCOLAX/MIRALAX) powder, Take 17 g by mouth daily. Mix 1 capful (17 grams) with 8 oz of water and drink by mouth every other day., Disp: , Rfl:  .  potassium chloride 40 MEQ/15ML (20%) LIQD, Take 10 mEq by mouth daily. Take 7.5 mls (10 meq) by mouth daily., Disp: , Rfl:  .  potassium chloride SA (K-DUR,KLOR-CON) 20 MEQ tablet, , Disp: , Rfl:  .  ranitidine (ZANTAC) 150 MG tablet, , Disp: , Rfl:  .  silver sulfADIAZINE (SILVADENE) 1 % cream, Apply pea-sized amount to wound daily., Disp: 50 g, Rfl: 0 .  solifenacin (VESICARE) 10 MG tablet, Take 10 mg by mouth., Disp: , Rfl:  .  tamsulosin (FLOMAX) 0.4 MG CAPS capsule, Take 0.4 mg by mouth 2 (two) times daily., Disp: , Rfl:  .  torsemide (DEMADEX) 20 MG tablet, Take 20 mg by mouth., Disp: , Rfl:   Social History   Tobacco Use  Smoking Status Never Smoker  Smokeless Tobacco Never Used    Allergies  Allergen Reactions  . Lithium Diarrhea and Other (See Comments)    Frequency in urination  Frequent urination   Objective:   Vitals:   02/19/18 1335  BP: 125/72  Pulse: 71  Resp: 16  Temp: (!) 97.4 F (36.3 C)   There is no height or weight on file to calculate BMI. Constitutional Well developed. Well nourished.  Vascular Dorsalis pedis pulses palpable bilaterally. Posterior tibial pulses non-palpable bilaterally. Capillary refill normal to all digits.  No cyanosis or clubbing noted. Pedal hair growth normal. R leg edema with calf pain prominent varicosities.  Neurologic Normal speech. Oriented to person, place, and time. Protective sensation absent  Dermatologic Wound Location: L hallux Wound Base: Granular Peri-wound: Calloused,  macerated Exudate: Scant/small serous drainage Wound Measurements: -9/16: 1x1 -9/26: 0.5x0.5 -10/7: 0.5x0.5 -10/14: 0.3x0.3 -11/11: 0.5x0.5 -11/26: 0.5x0.5 -129: 0.x5x0.5 -1/14: 0.5x0.5  Orthopedic: No pain to palpation either foot.   Assessment:   1. Venous stasis ulcer of other part of right lower leg limited to breakdown of skin without varicose veins (HCC)   2. 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  Ulcer L hallux  -Debrided as below -Dressed with silvadene and DSD  Procedure: Excisional Debridement of Wound Rationale: Removal of non-viable soft tissue from the wound to promote healing.  Anesthesia: none Pre-Debridement Wound Measurements: 0.2 cm x 0.2 cm x 0.1 cm  Post-Debridement Wound Measurements: 0.5 cm x 0.5 cm x 0.1 cm  Type of Debridement: Sharp Excisional Tissue Removed: Non-viable soft tissue Depth of Debridement: subcutaneous tissue. Technique: Sharp excisional debridement to bleeding, viable wound base.  Dressing: Dry, sterile, compression dressing. Disposition: Patient tolerated procedure well. Patient to return in 1 week for follow-up.  R Leg Venous Leg Ulcer -Multilayer dressing applied -To be removed Friday, followed by silvadene and tubigrip.   No follow-ups on file.

## 2018-02-19 NOTE — Addendum Note (Signed)
Addended by: Ventura Hollyfield on: 02/19/2018 05:05 PM   Modules accepted: Orders

## 2018-02-20 ENCOUNTER — Telehealth: Payer: Self-pay | Admitting: *Deleted

## 2018-02-20 DIAGNOSIS — L97521 Non-pressure chronic ulcer of other part of left foot limited to breakdown of skin: Secondary | ICD-10-CM

## 2018-02-20 DIAGNOSIS — I872 Venous insufficiency (chronic) (peripheral): Secondary | ICD-10-CM

## 2018-02-20 DIAGNOSIS — E11621 Type 2 diabetes mellitus with foot ulcer: Secondary | ICD-10-CM

## 2018-02-20 DIAGNOSIS — L97811 Non-pressure chronic ulcer of other part of right lower leg limited to breakdown of skin: Principal | ICD-10-CM

## 2018-02-20 DIAGNOSIS — R600 Localized edema: Secondary | ICD-10-CM

## 2018-02-20 DIAGNOSIS — E1151 Type 2 diabetes mellitus with diabetic peripheral angiopathy without gangrene: Secondary | ICD-10-CM

## 2018-02-20 NOTE — Telephone Encounter (Signed)
Left message informing pt's caregiver Primus BravoBetsy Edwards of pt's [facility] [phone] 02/26/2018 arrive 1:30pm for 2:00pm testing.

## 2018-02-20 NOTE — Telephone Encounter (Signed)
-----   Message from Park Liter, DPM sent at 02/19/2018  3:54 PM EST ----- Can we order Reflux [place] at [facility]? And can we call patient's aid Betsy at [phone] with the appointment time

## 2018-02-20 NOTE — Telephone Encounter (Signed)
[facility] - Mia scheduled pt for Venous insufficiency studies 02/26/2018 arrive 1:30pm for 2:00pm testing. Faxed orders to [facility].

## 2018-02-25 ENCOUNTER — Other Ambulatory Visit: Payer: Self-pay

## 2018-02-25 ENCOUNTER — Ambulatory Visit (INDEPENDENT_AMBULATORY_CARE_PROVIDER_SITE_OTHER): Payer: Medicare HMO | Admitting: Podiatry

## 2018-02-25 ENCOUNTER — Encounter: Payer: Self-pay | Admitting: Podiatry

## 2018-02-25 VITALS — BP 106/58 | HR 77 | Temp 97.8°F | Resp 16

## 2018-02-25 DIAGNOSIS — E11621 Type 2 diabetes mellitus with foot ulcer: Secondary | ICD-10-CM

## 2018-02-25 DIAGNOSIS — R600 Localized edema: Secondary | ICD-10-CM | POA: Diagnosis not present

## 2018-02-25 DIAGNOSIS — L97521 Non-pressure chronic ulcer of other part of left foot limited to breakdown of skin: Secondary | ICD-10-CM

## 2018-02-25 DIAGNOSIS — I872 Venous insufficiency (chronic) (peripheral): Secondary | ICD-10-CM | POA: Diagnosis not present

## 2018-02-25 DIAGNOSIS — L97811 Non-pressure chronic ulcer of other part of right lower leg limited to breakdown of skin: Secondary | ICD-10-CM

## 2018-02-25 NOTE — Progress Notes (Signed)
Subjective:  Patient ID: Henry Mcbride, male    DOB: [birth year],  MRN: [id number]  Chief Complaint  Patient presents with  . Foot Ulcer    F/U R leg wound and Left toe ulcer PT. states," L to looks much better, but the soe ron the leg looks yellow and painful." Tx: silvadene -pt deneis N/V/F?Ch -FBS: 154    73 y.o. male presents for wound care. Above history confirmed with patient.  Review of Systems: Negative except as noted in the HPI. Denies N/V/F/Ch.  Past Medical History:  Diagnosis Date  . Anxiety   . Arthritis   . CHF (congestive heart failure) (HCC)    dilated non-ischemic cardiomyopathy; [facility]  . CKD (chronic kidney disease)   . Cyst of kidney, acquired   . Depression   . Diabetes mellitus without complication (HCC)   . Hypertension   . Hypothyroidism   . Mental disorder   . Schizo affective schizophrenia (HCC)   . Seizures (HCC)    last was 3-4 years ago  . Shortness of breath   . Tardive dyskinesia     Current Outpatient Medications:  .  acetaminophen (TYLENOL) 325 MG tablet, Take 650 mg by mouth every 6 (six) hours as needed for pain., Disp: , Rfl:  .  amiodarone (PACERONE) 200 MG tablet, Take 200 mg by mouth., Disp: , Rfl:  .  aspirin 81 MG chewable tablet, Chew 81 mg by mouth., Disp: , Rfl:  .  atorvastatin (LIPITOR) 40 MG tablet, Take by mouth., Disp: , Rfl:  .  benztropine (COGENTIN) 2 MG tablet, Take 2 mg by mouth 2 (two) times daily., Disp: , Rfl:  .  benztropine (COGENTIN) 2 MG tablet, Take 2 mg by mouth., Disp: , Rfl:  .  carvedilol (COREG) 6.25 MG tablet, Take 6.25 mg by mouth 2 (two) times daily with a meal., Disp: , Rfl:  .  cyclobenzaprine (FLEXERIL) 10 MG tablet, Take 1 tablet (10 mg total) by mouth 3 (three) times daily as needed for muscle spasms., Disp: 30 tablet, Rfl: 0 .  divalproex (DEPAKOTE ER) 250 MG 24 hr tablet, Take 1,750 mg by mouth every evening. , Disp: , Rfl:  .  divalproex (DEPAKOTE) 500 MG DR  tablet, Take by mouth., Disp: , Rfl:  .  docusate sodium (COLACE) 100 MG capsule, Take 100 mg by mouth every other day., Disp: , Rfl:  .  docusate sodium (COLACE) 100 MG capsule, Take 100 mg by mouth., Disp: , Rfl:  .  donepezil (ARICEPT) 10 MG tablet, Take 10 mg by mouth at bedtime. , Disp: , Rfl:  .  doxycycline (VIBRA-TABS) 100 MG tablet, Take 1 tablet (100 mg total) by mouth 2 (two) times daily., Disp: 20 tablet, Rfl: 0 .  dutasteride (AVODART) 0.5 MG capsule, Take 0.5 mg by mouth daily., Disp: , Rfl:  .  ergocalciferol (VITAMIN D2) 50000 UNITS capsule, Take 50,000 Units by mouth once a week. Take 50,000 units on Fridays., Disp: , Rfl:  .  ferrous sulfate 324 (65 Fe) MG TBEC, Take 324 mg by mouth., Disp: , Rfl:  .  finasteride (PROSCAR) 5 MG tablet, Take 5 mg by mouth., Disp: , Rfl:  .  furosemide (LASIX) 20 MG tablet, Take 20 mg by mouth daily., Disp: , Rfl:  .  furosemide (LASIX) 40 MG tablet, Take 40 mg by mouth., Disp: , Rfl:  .  gabapentin (NEURONTIN) 100 MG capsule, , Disp: , Rfl:  .  ipratropium-albuterol (DUONEB) 0.5-2.5 (  3) MG/3ML SOLN, , Disp: , Rfl:  .  LANTUS 100 UNIT/ML injection, , Disp: , Rfl:  .  levofloxacin (LEVAQUIN) 750 MG tablet, , Disp: , Rfl:  .  levothyroxine (SYNTHROID, LEVOTHROID) 125 MCG tablet, Take by mouth., Disp: , Rfl:  .  levothyroxine (SYNTHROID, LEVOTHROID) 137 MCG tablet, Take 137 mcg by mouth daily before breakfast., Disp: , Rfl:  .  linagliptin (TRADJENTA) 5 MG TABS tablet, Take 5 mg by mouth daily., Disp: , Rfl:  .  linagliptin (TRADJENTA) 5 MG TABS tablet, Take 5 mg by mouth., Disp: , Rfl:  .  magnesium oxide (MAG-OX) 400 MG tablet, Take 400 mg by mouth., Disp: , Rfl:  .  metoprolol succinate (TOPROL-XL) 25 MG 24 hr tablet, Take 25 mg by mouth., Disp: , Rfl:  .  OLANZapine (ZYPREXA) 7.5 MG tablet, Take 7.5 mg by mouth., Disp: , Rfl:  .  OLANZapine zydis (ZYPREXA) 10 MG disintegrating tablet, Take 10 mg by mouth at bedtime., Disp: , Rfl:  .   oxybutynin (DITROPAN) 5 MG tablet, Take 5 mg by mouth 2 (two) times daily., Disp: , Rfl:  .  oxybutynin (DITROPAN-XL) 10 MG 24 hr tablet, , Disp: , Rfl:  .  oxyCODONE-acetaminophen (PERCOCET/ROXICET) 5-325 MG per tablet, 1-2 by mouth every 4 hours as needed **DO NOT EXCEED 4 GM OF TYLENOL IN 24 HOURS**, Disp: 120 tablet, Rfl: 0 .  pantoprazole (PROTONIX) 20 MG tablet, , Disp: , Rfl:  .  polyethylene glycol powder (GLYCOLAX/MIRALAX) powder, Take 17 g by mouth daily. Mix 1 capful (17 grams) with 8 oz of water and drink by mouth every other day., Disp: , Rfl:  .  potassium chloride 40 MEQ/15ML (20%) LIQD, Take 10 mEq by mouth daily. Take 7.5 mls (10 meq) by mouth daily., Disp: , Rfl:  .  potassium chloride SA (K-DUR,KLOR-CON) 20 MEQ tablet, , Disp: , Rfl:  .  ranitidine (ZANTAC) 150 MG tablet, , Disp: , Rfl:  .  silver sulfADIAZINE (SILVADENE) 1 % cream, Apply pea-sized amount to wound daily., Disp: 50 g, Rfl: 0 .  solifenacin (VESICARE) 10 MG tablet, Take 10 mg by mouth., Disp: , Rfl:  .  tamsulosin (FLOMAX) 0.4 MG CAPS capsule, Take 0.4 mg by mouth 2 (two) times daily., Disp: , Rfl:  .  torsemide (DEMADEX) 20 MG tablet, Take 20 mg by mouth., Disp: , Rfl:   Social History   Tobacco Use  Smoking Status Never Smoker  Smokeless Tobacco Never Used    Allergies  Allergen Reactions  . Lithium Diarrhea and Other (See Comments)    Frequency in urination  Frequent urination   Objective:   Vitals:   02/25/18 1322  BP: (!) 106/58  Pulse: 77  Resp: 16  Temp: 97.8 F (36.6 C)   There is no height or weight on file to calculate BMI. Constitutional Well developed. Well nourished.  Vascular Dorsalis pedis pulses palpable bilaterally. Posterior tibial pulses non-palpable bilaterally. Capillary refill normal to all digits.  No cyanosis or clubbing noted. Pedal hair growth normal. R leg edema with calf pain prominent varicosities.  Neurologic Normal speech. Oriented to person, place, and  time. Protective sensation absent  Dermatologic Wound Location: L hallux Wound Base: Granular Peri-wound: Calloused, macerated Exudate: Scant/small serous drainage Wound Measurements: -9/16: 1x1 -9/26: 0.5x0.5 -10/7: 0.5x0.5 -10/14: 0.3x0.3 -11/11: 0.5x0.5 -11/26: 0.5x0.5 -129: 0.x5x0.5 -1/14: 0.5x0.5 1/20: 0.5x0.5  R leg wound 1x1 distal, 2x2 proximal both with fibrogranular base.  Orthopedic: No pain to palpation either foot.  Assessment:   1. Venous stasis ulcer of other part of right lower leg limited to breakdown of skin without varicose veins (HCC)   2. 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 Localized edema    Plan:  Patient was evaluated and treated and all questions answered.  Ulcer L hallux  -Dressed with silvadene and DSD -Wear DM insole now that he is not wearing balance braces.  R Leg Venous Leg Ulcer -Improving. Continue silvadene and tubigrip.   No follow-ups on file.

## 2018-03-05 ENCOUNTER — Encounter: Payer: Self-pay | Admitting: Podiatry

## 2018-03-12 ENCOUNTER — Encounter: Payer: Self-pay | Admitting: Podiatry

## 2018-03-12 ENCOUNTER — Ambulatory Visit (INDEPENDENT_AMBULATORY_CARE_PROVIDER_SITE_OTHER): Payer: Medicare HMO | Admitting: Podiatry

## 2018-03-12 VITALS — BP 129/70 | HR 80 | Temp 98.0°F | Resp 16

## 2018-03-12 DIAGNOSIS — L97521 Non-pressure chronic ulcer of other part of left foot limited to breakdown of skin: Secondary | ICD-10-CM

## 2018-03-12 DIAGNOSIS — I872 Venous insufficiency (chronic) (peripheral): Secondary | ICD-10-CM

## 2018-03-12 DIAGNOSIS — L97811 Non-pressure chronic ulcer of other part of right lower leg limited to breakdown of skin: Secondary | ICD-10-CM | POA: Diagnosis not present

## 2018-03-12 DIAGNOSIS — E11621 Type 2 diabetes mellitus with foot ulcer: Secondary | ICD-10-CM

## 2018-03-13 NOTE — Progress Notes (Signed)
Subjective:  Patient ID: Henry Mcbride, male    DOB: 09-05-1945,  MRN: [id number]  Chief Complaint  Patient presents with  . Ulcer    F/U L hallux ulcer PT. state,s" needs trimming but no pain." tx: silvadene and bandaid -pt denies N/V/F?Ch  . Foot Ulcer    F/U L venous leg ulcer PT.s tates," looks better."   73 y.o. male presents for wound care. Above history confirmed with patient. Using compression wrap and Silvadene on the right leg.  Review of Systems: Negative except as noted in the HPI. Denies N/V/F/Ch.  Past Medical History:  Diagnosis Date  . Anxiety   . Arthritis   . CHF (congestive heart failure) (HCC)    dilated non-ischemic cardiomyopathy; [facility]  . CKD (chronic kidney disease)   . Cyst of kidney, acquired   . Depression   . Diabetes mellitus without complication (HCC)   . Hypertension   . Hypothyroidism   . Mental disorder   . Schizo affective schizophrenia (HCC)   . Seizures (HCC)    last was 3-4 years ago  . Shortness of breath   . Tardive dyskinesia     Current Outpatient Medications:  .  acetaminophen (TYLENOL) 325 MG tablet, Take 650 mg by mouth every 6 (six) hours as needed for pain., Disp: , Rfl:  .  amiodarone (PACERONE) 200 MG tablet, Take 200 mg by mouth., Disp: , Rfl:  .  aspirin 81 MG chewable tablet, Chew 81 mg by mouth., Disp: , Rfl:  .  atorvastatin (LIPITOR) 40 MG tablet, Take by mouth., Disp: , Rfl:  .  benztropine (COGENTIN) 2 MG tablet, Take 2 mg by mouth 2 (two) times daily., Disp: , Rfl:  .  benztropine (COGENTIN) 2 MG tablet, Take 2 mg by mouth., Disp: , Rfl:  .  carvedilol (COREG) 6.25 MG tablet, Take 6.25 mg by mouth 2 (two) times daily with a meal., Disp: , Rfl:  .  cyclobenzaprine (FLEXERIL) 10 MG tablet, Take 1 tablet (10 mg total) by mouth 3 (three) times daily as needed for muscle spasms., Disp: 30 tablet, Rfl: 0 .  divalproex (DEPAKOTE ER) 250 MG 24 hr tablet, Take 1,750 mg by mouth every  evening. , Disp: , Rfl:  .  divalproex (DEPAKOTE) 500 MG DR tablet, Take by mouth., Disp: , Rfl:  .  docusate sodium (COLACE) 100 MG capsule, Take 100 mg by mouth every other day., Disp: , Rfl:  .  docusate sodium (COLACE) 100 MG capsule, Take 100 mg by mouth., Disp: , Rfl:  .  donepezil (ARICEPT) 10 MG tablet, Take 10 mg by mouth at bedtime. , Disp: , Rfl:  .  doxycycline (VIBRA-TABS) 100 MG tablet, Take 1 tablet (100 mg total) by mouth 2 (two) times daily., Disp: 20 tablet, Rfl: 0 .  dutasteride (AVODART) 0.5 MG capsule, Take 0.5 mg by mouth daily., Disp: , Rfl:  .  ergocalciferol (VITAMIN D2) 50000 UNITS capsule, Take 50,000 Units by mouth once a week. Take 50,000 units on Fridays., Disp: , Rfl:  .  ferrous sulfate 324 (65 Fe) MG TBEC, Take 324 mg by mouth., Disp: , Rfl:  .  finasteride (PROSCAR) 5 MG tablet, Take 5 mg by mouth., Disp: , Rfl:  .  furosemide (LASIX) 20 MG tablet, Take 20 mg by mouth daily., Disp: , Rfl:  .  furosemide (LASIX) 40 MG tablet, Take 40 mg by mouth., Disp: , Rfl:  .  gabapentin (NEURONTIN) 100 MG capsule, ,  Disp: , Rfl:  .  ipratropium-albuterol (DUONEB) 0.5-2.5 (3) MG/3ML SOLN, , Disp: , Rfl:  .  LANTUS 100 UNIT/ML injection, , Disp: , Rfl:  .  levofloxacin (LEVAQUIN) 750 MG tablet, , Disp: , Rfl:  .  levothyroxine (SYNTHROID, LEVOTHROID) 125 MCG tablet, Take by mouth., Disp: , Rfl:  .  levothyroxine (SYNTHROID, LEVOTHROID) 137 MCG tablet, Take 137 mcg by mouth daily before breakfast., Disp: , Rfl:  .  linagliptin (TRADJENTA) 5 MG TABS tablet, Take 5 mg by mouth daily., Disp: , Rfl:  .  linagliptin (TRADJENTA) 5 MG TABS tablet, Take 5 mg by mouth., Disp: , Rfl:  .  magnesium oxide (MAG-OX) 400 MG tablet, Take 400 mg by mouth., Disp: , Rfl:  .  metoprolol succinate (TOPROL-XL) 25 MG 24 hr tablet, Take 25 mg by mouth., Disp: , Rfl:  .  OLANZapine (ZYPREXA) 7.5 MG tablet, Take 7.5 mg by mouth., Disp: , Rfl:  .  OLANZapine zydis (ZYPREXA) 10 MG disintegrating tablet,  Take 10 mg by mouth at bedtime., Disp: , Rfl:  .  oxybutynin (DITROPAN) 5 MG tablet, Take 5 mg by mouth 2 (two) times daily., Disp: , Rfl:  .  oxybutynin (DITROPAN-XL) 10 MG 24 hr tablet, , Disp: , Rfl:  .  oxyCODONE-acetaminophen (PERCOCET/ROXICET) 5-325 MG per tablet, 1-2 by mouth every 4 hours as needed **DO NOT EXCEED 4 GM OF TYLENOL IN 24 HOURS**, Disp: 120 tablet, Rfl: 0 .  pantoprazole (PROTONIX) 20 MG tablet, , Disp: , Rfl:  .  polyethylene glycol powder (GLYCOLAX/MIRALAX) powder, Take 17 g by mouth daily. Mix 1 capful (17 grams) with 8 oz of water and drink by mouth every other day., Disp: , Rfl:  .  potassium chloride 40 MEQ/15ML (20%) LIQD, Take 10 mEq by mouth daily. Take 7.5 mls (10 meq) by mouth daily., Disp: , Rfl:  .  potassium chloride SA (K-DUR,KLOR-CON) 20 MEQ tablet, , Disp: , Rfl:  .  ranitidine (ZANTAC) 150 MG tablet, , Disp: , Rfl:  .  silver sulfADIAZINE (SILVADENE) 1 % cream, Apply pea-sized amount to wound daily., Disp: 50 g, Rfl: 0 .  solifenacin (VESICARE) 10 MG tablet, Take 10 mg by mouth., Disp: , Rfl:  .  tamsulosin (FLOMAX) 0.4 MG CAPS capsule, Take 0.4 mg by mouth 2 (two) times daily., Disp: , Rfl:  .  torsemide (DEMADEX) 20 MG tablet, Take 20 mg by mouth., Disp: , Rfl:   Social History   Tobacco Use  Smoking Status Never Smoker  Smokeless Tobacco Never Used    Allergies  Allergen Reactions  . Lithium Diarrhea and Other (See Comments)    Frequency in urination  Frequent urination   Objective:   Vitals:   03/12/18 1024  BP: 129/70  Pulse: 80  Resp: 16  Temp: 98 F (36.7 C)   There is no height or weight on file to calculate BMI. Constitutional Well developed. Well nourished.  Vascular Dorsalis pedis pulses palpable bilaterally. Posterior tibial pulses non-palpable bilaterally. Capillary refill normal to all digits.  No cyanosis or clubbing noted. Pedal hair growth normal. R leg edema with calf pain prominent varicosities.  Neurologic  Normal speech. Oriented to person, place, and time. Protective sensation absent  Dermatologic Wound Location: L hallux Wound Base: Granular Peri-wound: Calloused, macerated Exudate: Scant/small serous drainage Wound Measurements: -9/16: 1x1 -9/26: 0.5x0.5 -10/7: 0.5x0.5 -10/14: 0.3x0.3 -11/11: 0.5x0.5 -11/26: 0.5x0.5 -129: 0.x5x0.5 -1/14: 0.5x0.5 1/20: 0.5x0.5 2/4: 1x1  R leg wound 1x1 distal, 2x2 proximal both with  fibrogranular base.  Orthopedic: No pain to palpation either foot.   Assessment:   1. Venous stasis ulcer of other part of right lower leg limited to breakdown of skin without varicose veins (HCC)   2. 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  Ulcer L hallux  -Debrided as below  Procedure: Excisional Debridement of Wound Rationale: Removal of non-viable soft tissue from the wound to promote healing.  Anesthesia: none Pre-Debridement Wound Measurements: 0.5 cm x 0.5 cm x 0.1 cm  Post-Debridement Wound Measurements: 1 cm x 1 cm x 0.1 cm  Type of Debridement: Sharp Excisional Tissue Removed: Non-viable soft tissue Depth of Debridement: subcutaneous tissue. Technique: Sharp excisional debridement to bleeding, viable wound base.  Dressing: Dry, sterile, compression dressing. Disposition: Patient tolerated procedure well. Patient to return in 1 week for follow-up.   R Leg Venous Leg Ulcer -Again improving.  Continue Silvadene and Tubigrip  No follow-ups on file.

## 2018-03-26 ENCOUNTER — Ambulatory Visit (INDEPENDENT_AMBULATORY_CARE_PROVIDER_SITE_OTHER): Payer: Medicare HMO | Admitting: Podiatry

## 2018-03-26 ENCOUNTER — Encounter: Payer: Self-pay | Admitting: Podiatry

## 2018-03-26 ENCOUNTER — Other Ambulatory Visit: Payer: Self-pay

## 2018-03-26 VITALS — BP 120/68 | HR 83 | Temp 98.0°F | Resp 16

## 2018-03-26 DIAGNOSIS — L97521 Non-pressure chronic ulcer of other part of left foot limited to breakdown of skin: Secondary | ICD-10-CM

## 2018-03-26 DIAGNOSIS — E11621 Type 2 diabetes mellitus with foot ulcer: Secondary | ICD-10-CM

## 2018-03-26 DIAGNOSIS — L97811 Non-pressure chronic ulcer of other part of right lower leg limited to breakdown of skin: Secondary | ICD-10-CM

## 2018-03-26 DIAGNOSIS — I872 Venous insufficiency (chronic) (peripheral): Secondary | ICD-10-CM | POA: Diagnosis not present

## 2018-03-28 ENCOUNTER — Telehealth: Payer: Self-pay | Admitting: *Deleted

## 2018-03-28 DIAGNOSIS — E1151 Type 2 diabetes mellitus with diabetic peripheral angiopathy without gangrene: Secondary | ICD-10-CM

## 2018-03-28 MED ORDER — MOISTURIZING LOTION EX LOTN: TOPICAL_LOTION | CUTANEOUS | 1 refills | Status: AC

## 2018-03-28 NOTE — Telephone Encounter (Signed)
Group home called stating that they need a written order and Rx for patient to apply the Gold Bond lotion.

## 2018-04-06 NOTE — Progress Notes (Signed)
Subjective:  Patient ID: Henry Mcbride, male    DOB: [birth year],  MRN: [id number]  Chief Complaint  Patient presents with  . Foot Ulcer    F/U L hallux and R leg ulcer PT. staes," doing alright." -pt deneis any problems or issues with ulcer -w/ tenderness Tx: silvadene -FBS: 70   73 y.o. male presents for wound care. Above history confirmed with patient.  Review of Systems: Negative except as noted in the HPI. Denies N/V/F/Ch.  Past Medical History:  Diagnosis Date  . Anxiety   . Arthritis   . CHF (congestive heart failure) (HCC)    dilated non-ischemic cardiomyopathy; followed at [facility]  . CKD (chronic kidney disease)   . Cyst of kidney, acquired   . Depression   . Diabetes mellitus without complication (HCC)   . Hypertension   . Hypothyroidism   . Mental disorder   . Schizo affective schizophrenia (HCC)   . Seizures (HCC)    last was 3-4 years ago  . Shortness of breath   . Tardive dyskinesia     Current Outpatient Medications:  .  acetaminophen (TYLENOL) 325 MG tablet, Take 650 mg by mouth every 6 (six) hours as needed for pain., Disp: , Rfl:  .  amiodarone (PACERONE) 200 MG tablet, Take 200 mg by mouth., Disp: , Rfl:  .  aspirin 81 MG chewable tablet, Chew 81 mg by mouth., Disp: , Rfl:  .  atorvastatin (LIPITOR) 40 MG tablet, Take by mouth., Disp: , Rfl:  .  benztropine (COGENTIN) 2 MG tablet, Take 2 mg by mouth 2 (two) times daily., Disp: , Rfl:  .  benztropine (COGENTIN) 2 MG tablet, Take 2 mg by mouth., Disp: , Rfl:  .  carvedilol (COREG) 6.25 MG tablet, Take 6.25 mg by mouth 2 (two) times daily with a meal., Disp: , Rfl:  .  cyclobenzaprine (FLEXERIL) 10 MG tablet, Take 1 tablet (10 mg total) by mouth 3 (three) times daily as needed for muscle spasms., Disp: 30 tablet, Rfl: 0 .  divalproex (DEPAKOTE ER) 250 MG 24 hr tablet, Take 1,750 mg by mouth every evening. , Disp: , Rfl:  .  divalproex (DEPAKOTE) 500 MG DR tablet, Take by mouth.,  Disp: , Rfl:  .  docusate sodium (COLACE) 100 MG capsule, Take 100 mg by mouth every other day., Disp: , Rfl:  .  docusate sodium (COLACE) 100 MG capsule, Take 100 mg by mouth., Disp: , Rfl:  .  donepezil (ARICEPT) 10 MG tablet, Take 10 mg by mouth at bedtime. , Disp: , Rfl:  .  doxycycline (VIBRA-TABS) 100 MG tablet, Take 1 tablet (100 mg total) by mouth 2 (two) times daily., Disp: 20 tablet, Rfl: 0 .  dutasteride (AVODART) 0.5 MG capsule, Take 0.5 mg by mouth daily., Disp: , Rfl:  .  Emollient (MOISTURIZING LOTION) LOTN, Apply Gold Bond lotion to feet nightly for dry skin.  Do not apply between the toes., Disp: 1 Bottle, Rfl: 1 .  ergocalciferol (VITAMIN D2) 50000 UNITS capsule, Take 50,000 Units by mouth once a week. Take 50,000 units on Fridays., Disp: , Rfl:  .  ferrous sulfate 324 (65 Fe) MG TBEC, Take 324 mg by mouth., Disp: , Rfl:  .  finasteride (PROSCAR) 5 MG tablet, Take 5 mg by mouth., Disp: , Rfl:  .  furosemide (LASIX) 20 MG tablet, Take 20 mg by mouth daily., Disp: , Rfl:  .  furosemide (LASIX) 40 MG tablet, Take 40 mg by mouth., Disp: ,  Rfl:  .  gabapentin (NEURONTIN) 100 MG capsule, , Disp: , Rfl:  .  ipratropium-albuterol (DUONEB) 0.5-2.5 (3) MG/3ML SOLN, , Disp: , Rfl:  .  LANTUS 100 UNIT/ML injection, , Disp: , Rfl:  .  levofloxacin (LEVAQUIN) 750 MG tablet, , Disp: , Rfl:  .  levothyroxine (SYNTHROID, LEVOTHROID) 125 MCG tablet, Take by mouth., Disp: , Rfl:  .  levothyroxine (SYNTHROID, LEVOTHROID) 137 MCG tablet, Take 137 mcg by mouth daily before breakfast., Disp: , Rfl:  .  linagliptin (TRADJENTA) 5 MG TABS tablet, Take 5 mg by mouth daily., Disp: , Rfl:  .  linagliptin (TRADJENTA) 5 MG TABS tablet, Take 5 mg by mouth., Disp: , Rfl:  .  magnesium oxide (MAG-OX) 400 MG tablet, Take 400 mg by mouth., Disp: , Rfl:  .  metoprolol succinate (TOPROL-XL) 25 MG 24 hr tablet, Take 25 mg by mouth., Disp: , Rfl:  .  OLANZapine (ZYPREXA) 7.5 MG tablet, Take 7.5 mg by mouth., Disp: ,  Rfl:  .  OLANZapine zydis (ZYPREXA) 10 MG disintegrating tablet, Take 10 mg by mouth at bedtime., Disp: , Rfl:  .  oxybutynin (DITROPAN) 5 MG tablet, Take 5 mg by mouth 2 (two) times daily., Disp: , Rfl:  .  oxybutynin (DITROPAN-XL) 10 MG 24 hr tablet, , Disp: , Rfl:  .  oxyCODONE-acetaminophen (PERCOCET/ROXICET) 5-325 MG per tablet, 1-2 by mouth every 4 hours as needed **DO NOT EXCEED 4 GM OF TYLENOL IN 24 HOURS**, Disp: 120 tablet, Rfl: 0 .  pantoprazole (PROTONIX) 20 MG tablet, , Disp: , Rfl:  .  polyethylene glycol powder (GLYCOLAX/MIRALAX) powder, Take 17 g by mouth daily. Mix 1 capful (17 grams) with 8 oz of water and drink by mouth every other day., Disp: , Rfl:  .  potassium chloride 40 MEQ/15ML (20%) LIQD, Take 10 mEq by mouth daily. Take 7.5 mls (10 meq) by mouth daily., Disp: , Rfl:  .  potassium chloride SA (K-DUR,KLOR-CON) 20 MEQ tablet, , Disp: , Rfl:  .  PRODIGY LANCETS 28G MISC, , Disp: , Rfl:  .  ranitidine (ZANTAC) 150 MG tablet, , Disp: , Rfl:  .  silver sulfADIAZINE (SILVADENE) 1 % cream, Apply pea-sized amount to wound daily., Disp: 50 g, Rfl: 0 .  solifenacin (VESICARE) 10 MG tablet, Take 10 mg by mouth., Disp: , Rfl:  .  tamsulosin (FLOMAX) 0.4 MG CAPS capsule, Take 0.4 mg by mouth 2 (two) times daily., Disp: , Rfl:  .  torsemide (DEMADEX) 20 MG tablet, Take 20 mg by mouth., Disp: , Rfl:   Social History   Tobacco Use  Smoking Status Never Smoker  Smokeless Tobacco Never Used    Allergies  Allergen Reactions  . Lithium Diarrhea and Other (See Comments)    Frequency in urination  Frequent urination   Objective:   Vitals:   03/26/18 1016  BP: 120/68  Pulse: 83  Resp: 16  Temp: 98 F (36.7 C)   There is no height or weight on file to calculate BMI. Constitutional Well developed. Well nourished.  Vascular Dorsalis pedis pulses palpable bilaterally. Posterior tibial pulses non-palpable bilaterally. Capillary refill normal to all digits.  No cyanosis or  clubbing noted. Pedal hair growth normal. R leg edema with calf pain prominent varicosities.  Neurologic Normal speech. Oriented to person, place, and time. Protective sensation absent  Dermatologic Wound Location: L hallux Wound Base: Granular Peri-wound: Calloused, macerated Exudate: Scant/small serous drainage Wound Measurements: -9/16: 1x1 -9/26: 0.5x0.5 -10/7: 0.5x0.5 -10/14: 0.3x0.3 -11/11:  0.5x0.5 -11/26: 0.5x0.5 -129: 0.x5x0.5 -1/14: 0.5x0.5 1/20: 0.5x0.5 2/4: 1x1 2/18: 0.5x0.5  R leg wound 1x1 distal, 2x2 proximal both with fibrogranular base.  Orthopedic: No pain to palpation either foot.   Assessment:   1. Diabetic ulcer of toe of left foot associated with type 2 diabetes mellitus, limited to breakdown of skin (HCC)   2. Venous stasis ulcer of other part of right lower leg limited to breakdown of skin without varicose veins (HCC)    Plan:  Patient was evaluated and treated and all questions answered.  Ulcer L hallux  -At this point to the patient even from [place] to the wound care center due to nonhealing wound.  We have tried offloading without long-term improvement. -Debrided as below  Procedure: Selective Debridement of Wound Rationale: Removal of devitalized tissue from the wound to promote healing.  Pre-Debridement Wound Measurements: 0.5 cm x 0.5 cm x 0.1 cm  Post-Debridement Wound Measurements: same as pre-debridement. Type of Debridement: sharp selective Tissue Removed: Devitalized soft-tissue Dressing: Dry, sterile, compression dressing. Disposition: Patient tolerated procedure well. Patient to return in 1 week for follow-up.    R Leg Venous Leg Ulcer -Almost he will continue Silvadene until healed -Continue compression wrap.  Return in about 1 month (around 04/24/2018) for Wound Care.

## 2018-04-08 ENCOUNTER — Telehealth: Payer: Self-pay | Admitting: *Deleted

## 2018-04-08 DIAGNOSIS — E1151 Type 2 diabetes mellitus with diabetic peripheral angiopathy without gangrene: Secondary | ICD-10-CM

## 2018-04-08 DIAGNOSIS — E11621 Type 2 diabetes mellitus with foot ulcer: Secondary | ICD-10-CM

## 2018-04-08 DIAGNOSIS — R600 Localized edema: Secondary | ICD-10-CM

## 2018-04-08 DIAGNOSIS — L97521 Non-pressure chronic ulcer of other part of left foot limited to breakdown of skin: Secondary | ICD-10-CM

## 2018-04-08 DIAGNOSIS — I872 Venous insufficiency (chronic) (peripheral): Secondary | ICD-10-CM

## 2018-04-08 DIAGNOSIS — L97811 Non-pressure chronic ulcer of other part of right lower leg limited to breakdown of skin: Secondary | ICD-10-CM

## 2018-04-08 NOTE — Telephone Encounter (Signed)
-----   Message from Park Liter, DPM sent at 04/06/2018  3:28 PM EST ----- Can we refer to the wound center in [place] for eval

## 2018-04-08 NOTE — Telephone Encounter (Signed)
Faxed referral, clinicals, and demographics to [facility].

## 2018-04-12 ENCOUNTER — Telehealth: Payer: Self-pay | Admitting: Podiatry

## 2018-04-12 NOTE — Telephone Encounter (Signed)
Robin with group home has order to apply silvadene 1% cream daily. y'all sent him to the wound care center and they have changed his orders. I need a D/C order to stop using that cream to put into his chart. Please call me at [phone] or fax the order to the group home at [phone].

## 2018-04-15 NOTE — Telephone Encounter (Signed)
Dr. Samuella Cota please advise of d/c order

## 2018-04-15 NOTE — Telephone Encounter (Signed)
Please approve d/c order

## 2018-04-15 NOTE — Telephone Encounter (Signed)
Faxed notification to discontinue the Emollient Moisturizing Lotion by Dr. Samuella Cota.

## 2018-04-23 ENCOUNTER — Ambulatory Visit: Payer: Medicare HMO | Admitting: Podiatry

## 2018-10-04 ENCOUNTER — Other Ambulatory Visit: Payer: Self-pay

## 2018-10-04 DIAGNOSIS — I872 Venous insufficiency (chronic) (peripheral): Secondary | ICD-10-CM

## 2018-10-08 ENCOUNTER — Ambulatory Visit (INDEPENDENT_AMBULATORY_CARE_PROVIDER_SITE_OTHER): Payer: Medicare HMO | Admitting: Vascular Surgery

## 2018-10-08 ENCOUNTER — Ambulatory Visit (HOSPITAL_COMMUNITY)
Admission: RE | Admit: 2018-10-08 | Discharge: 2018-10-08 | Disposition: A | Discharge status: Home / Self Care | Payer: Medicare HMO | Source: Ambulatory Visit | Attending: Family | Admitting: Family

## 2018-10-08 ENCOUNTER — Other Ambulatory Visit: Payer: Self-pay

## 2018-10-08 ENCOUNTER — Encounter: Payer: Self-pay | Admitting: Vascular Surgery

## 2018-10-08 DIAGNOSIS — I872 Venous insufficiency (chronic) (peripheral): Secondary | ICD-10-CM

## 2018-10-08 NOTE — Progress Notes (Signed)
Patient name: Henry Mcbride MRN: [id number] DOB: [birth year] Sex: male  REASON FOR CONSULT: Evaluate for right lower extremity venous insufficiency and wound  HPI: Henry Mcbride is a 73 y.o. male, with multiple medical problems currently living in a group home that presents for evaluation of possible right lower extremity venous insufficiency and chronic wound.  Patient presents with one of the managers in his group home and states he had a wound since at least January or February of this year on his right medial calf.  He has been going to wound clinic and the wound is nearly completely healed and he only has one additional appointment.  Patient states that compression socks were ordered for him and he should receive them next week.  These were ordered through the wound clinic.  This is the first he has ever had compression.  He denies any previous knowledge of venous intervention and/or DVT.  No other trauma.  He had a lower extremity venous ultrasound done on 02/28/2018 at [facility] radiology that showed his right great saphenous vein in the proximal mid and lower thigh as well as the proximal calf was occluded.  There was no reflux in the patent mid to distal calf great saphenous vein.  There was no evidence of deep venous reflux either  No DVT.  Patient states he has no pain in his lower extremities.  No heaviness no leg swelling no itching no burning etc.  Past Medical History:  Diagnosis Date  . Anxiety   . Arthritis   . CHF (congestive heart failure) (HCC)    dilated non-ischemic cardiomyopathy; followed at [facility]  . CKD (chronic kidney disease)   . Cyst of kidney, acquired   . Depression   . Diabetes mellitus without complication (HCC)   . Hypertension   . Hypothyroidism   . Mental disorder   . Schizo affective schizophrenia (HCC)   . Seizures (HCC)    last was 3-4 years ago  . Shortness of breath   . Tardive dyskinesia     Past Surgical History:   Procedure Laterality Date  . ANKLE ARTHROSCOPY  right  . COLON RESECTION  July 2014   had mass surgery in [place]  . COLONOSCOPY    . HERNIA REPAIR     times 2  . POSTERIOR CERVICAL FUSION/FORAMINOTOMY N/A 10/28/2012   Procedure: CERVICAL TWO THREE, CERVICAL THREE FOUR, CERVICAL FOUR FIVE, CERVICAL FIVE SIX, CERVICAL SIX SEVEN POSTERIOR CERVICAL FUSION WITH LATERAL MASS FIXATION;  Surgeon: Temple Pacini, MD;  Location: [facility];  Service: Neurosurgery;  Laterality: N/A;  POSTERIOR CERVICAL FUSION/FORAMINOTOMY LEVEL 5  . TRANSTHORACIC ECHOCARDIOGRAM  06/20/12   mild to moderate LV systolic dysfunction with akinesisa of basilar and mild inferolateral segments, EF 45% (visual 40%), impaired relaxation, mildly dilated LA  . WRIST ARTHROPLASTY     at age 67    History reviewed. No pertinent family history.  SOCIAL HISTORY: Social History   Socioeconomic History  . Marital status: Single    Spouse name: Not on file  . Number of children: Not on file  . Years of education: Not on file  . Highest education level: Not on file  Occupational History  . Not on file  Social Needs  . Financial resource strain: Not on file  . Food insecurity    Worry: Not on file    Inability: Not on file  . Transportation needs    Medical: Not on file    Non-medical:  Not on file  Tobacco Use  . Smoking status: Never Smoker  . Smokeless tobacco: Never Used  Substance and Sexual Activity  . Alcohol use: No  . Drug use: No  . Sexual activity: Not on file  Lifestyle  . Physical activity    Days per week: Not on file    Minutes per session: Not on file  . Stress: Not on file  Relationships  . Social Herbalist on phone: Not on file    Gets together: Not on file    Attends religious service: Not on file    Active member of club or organization: Not on file    Attends meetings of clubs or organizations: Not on file    Relationship status: Not on file  . Intimate partner violence     Fear of current or ex partner: Not on file    Emotionally abused: Not on file    Physically abused: Not on file    Forced sexual activity: Not on file  Other Topics Concern  . Not on file  Social History Narrative  . Not on file    Allergies  Allergen Reactions  . Lithium Diarrhea and Other (See Comments)    Frequency in urination  Frequent urination    Current Outpatient Medications  Medication Sig Dispense Refill  . acetaminophen (TYLENOL) 325 MG tablet Take 650 mg by mouth every 6 (six) hours as needed for pain.    Marland Kitchen amiodarone (PACERONE) 200 MG tablet Take 200 mg by mouth.    Marland Kitchen aspirin 81 MG chewable tablet Chew 81 mg by mouth.    Marland Kitchen atorvastatin (LIPITOR) 40 MG tablet Take by mouth.    . benztropine (COGENTIN) 2 MG tablet Take 2 mg by mouth.    . carvedilol (COREG) 6.25 MG tablet Take 6.25 mg by mouth 2 (two) times daily with a meal.    . cyclobenzaprine (FLEXERIL) 10 MG tablet Take 1 tablet (10 mg total) by mouth 3 (three) times daily as needed for muscle spasms. 30 tablet 0  . divalproex (DEPAKOTE ER) 250 MG 24 hr tablet Take 1,750 mg by mouth every evening.     . docusate sodium (COLACE) 100 MG capsule Take 100 mg by mouth.    . donepezil (ARICEPT) 10 MG tablet Take 10 mg by mouth at bedtime.     . dutasteride (AVODART) 0.5 MG capsule Take 0.5 mg by mouth daily.    . Emollient (MOISTURIZING LOTION) LOTN Apply Gold Bond lotion to feet nightly for dry skin.  Do not apply between the toes. 1 Bottle 1  . ergocalciferol (VITAMIN D2) 50000 UNITS capsule Take 50,000 Units by mouth once a week. Take 50,000 units on Fridays.    . ferrous sulfate 324 (65 Fe) MG TBEC Take 324 mg by mouth.    . finasteride (PROSCAR) 5 MG tablet Take 5 mg by mouth.    . furosemide (LASIX) 20 MG tablet Take 20 mg by mouth daily.    Marland Kitchen gabapentin (NEURONTIN) 100 MG capsule     . ipratropium-albuterol (DUONEB) 0.5-2.5 (3) MG/3ML SOLN     . LANTUS 100 UNIT/ML injection     . levothyroxine (SYNTHROID,  LEVOTHROID) 125 MCG tablet Take by mouth.    . levothyroxine (SYNTHROID, LEVOTHROID) 137 MCG tablet Take 137 mcg by mouth daily before breakfast.    . linagliptin (TRADJENTA) 5 MG TABS tablet Take 5 mg by mouth daily.    . magnesium oxide (MAG-OX) 400 MG  tablet Take 400 mg by mouth.    . metoprolol succinate (TOPROL-XL) 25 MG 24 hr tablet Take 25 mg by mouth.    . OLANZapine zydis (ZYPREXA) 10 MG disintegrating tablet Take 10 mg by mouth at bedtime.    Marland Kitchen. oxybutynin (DITROPAN) 5 MG tablet Take 5 mg by mouth 2 (two) times daily.    . pantoprazole (PROTONIX) 20 MG tablet     . polyethylene glycol powder (GLYCOLAX/MIRALAX) powder Take 17 g by mouth daily. Mix 1 capful (17 grams) with 8 oz of water and drink by mouth every other day.    . potassium chloride SA (K-DUR,KLOR-CON) 20 MEQ tablet     . PRODIGY LANCETS 28G MISC     . ranitidine (ZANTAC) 150 MG tablet     . silver sulfADIAZINE (SILVADENE) 1 % cream Apply pea-sized amount to wound daily. 50 g 0  . solifenacin (VESICARE) 10 MG tablet Take 10 mg by mouth.    . tamsulosin (FLOMAX) 0.4 MG CAPS capsule Take 0.4 mg by mouth 2 (two) times daily.    Marland Kitchen. torsemide (DEMADEX) 20 MG tablet Take 20 mg by mouth.     No current facility-administered medications for this visit.     REVIEW OF SYSTEMS:  [X]  denotes positive finding, [ ]  denotes negative finding Cardiac  Comments:  Chest pain or chest pressure:    Shortness of breath upon exertion:    Short of breath when lying flat:    Irregular heart rhythm:        Vascular    Pain in calf, thigh, or hip brought on by ambulation:    Pain in feet at night that wakes you up from your sleep:     Blood clot in your veins:    Leg swelling:  x       Pulmonary    Oxygen at home:    Productive cough:     Wheezing:         Neurologic    Sudden weakness in arms or legs:     Sudden numbness in arms or legs:     Sudden onset of difficulty speaking or slurred speech:    Temporary loss of vision in one  eye:     Problems with dizziness:         Gastrointestinal    Blood in stool:     Vomited blood:         Genitourinary    Burning when urinating:     Blood in urine:        Psychiatric    Major depression:         Hematologic    Bleeding problems:    Problems with blood clotting too easily:        Skin    Rashes or ulcers:        Constitutional    Fever or chills:      PHYSICAL EXAM: Vitals:   10/08/18 1205  BP: 117/77  Pulse: 97  Resp: 14  Temp: 98.1 F (36.7 C)  TempSrc: Temporal  SpO2: 98%  Weight: 218 lb (98.9 kg)  Height: 6\' 1"  (1.854 m)    GENERAL: The patient is a well-nourished male, in no acute distress. The vital signs are documented above. CARDIAC: There is a regular rate and rhythm.  VASCULAR:  2+ palpable radial pulses bilaterally 2+ palpable femoral pulses bilaterally 1+ palpable DP pulses bilaterally Nearly healed right leg medial ulcer over ankle Multiple apparent varicosities over right  calf  PULMONARY: There is good air exchange bilaterally without wheezing or rales. ABDOMEN: Soft and non-tender with normal pitched bowel sounds.  MUSCULOSKELETAL: There are no major deformities or cyanosis. NEUROLOGIC: No focal weakness or paresthesias are detected. SKIN: There are no ulcers or rashes noted. PSYCHIATRIC: The patient has a normal affect.  DATA:    ABIs today are 1.2 on the right triphasic and 1.4 on the left triphasic  Assessment/Plan:  73 year old male that presents for evaluation of right lower extremity venous insufficiency in the setting of chronic wound.  His wound is now almost completely healed and looks consistent with a venous ulcer.  Wound care has done a really nice job.  That being said his venous [facility] on 02/28/2018 showed an occluded great saphenous vein in the proximal mid and lower thigh but no reflux in the distal great saphenous vein.  There was no evidence of deep reflux.  His  ABIs are essentially normal and he has palpable dorsalis pedis pulses and pulsatile toe tracings - so no significant PAD that would warrant intervention.  His wound is nearly completely healed.  He has no other symptoms in the leg including no heaviness burning etc.  I discussed options would be to repeat venous reflux study here in our clinic since it has been 8 months to see if there is any other evidence of reflux apparent in the interim.  Other option would be to plan for compression and conservative therapy given nothing on his venous reflux study from 02/2018 would need intervention at this time.  He is favoring conservative approach.  I offered follow-up as needed and or to let us know if he gets a wound again.   Cephus Shellinghristopher J. Oshen Wlodarczyk, MD [facility] Office: [phone] Pager: [phone]

## 2019-01-09 ENCOUNTER — Ambulatory Visit: Payer: Medicare HMO | Admitting: Sports Medicine

## 2019-01-14 ENCOUNTER — Other Ambulatory Visit: Payer: Self-pay

## 2019-01-14 ENCOUNTER — Ambulatory Visit (INDEPENDENT_AMBULATORY_CARE_PROVIDER_SITE_OTHER): Payer: Medicare HMO | Admitting: Podiatry

## 2019-01-14 ENCOUNTER — Other Ambulatory Visit: Payer: Self-pay | Admitting: Podiatry

## 2019-01-14 ENCOUNTER — Telehealth: Payer: Self-pay

## 2019-01-14 DIAGNOSIS — L97521 Non-pressure chronic ulcer of other part of left foot limited to breakdown of skin: Secondary | ICD-10-CM

## 2019-01-14 DIAGNOSIS — Z5329 Procedure and treatment not carried out because of patient's decision for other reasons: Secondary | ICD-10-CM

## 2019-01-14 NOTE — Progress Notes (Signed)
No show for appt. 

## 2019-04-18 DIAGNOSIS — J1282 Pneumonia due to coronavirus disease 2019: Secondary | ICD-10-CM | POA: Diagnosis not present

## 2019-04-19 DIAGNOSIS — U071 COVID-19: Secondary | ICD-10-CM

## 2019-04-19 DIAGNOSIS — R739 Hyperglycemia, unspecified: Secondary | ICD-10-CM

## 2019-04-19 DIAGNOSIS — N189 Chronic kidney disease, unspecified: Secondary | ICD-10-CM

## 2019-04-19 DIAGNOSIS — I959 Hypotension, unspecified: Secondary | ICD-10-CM

## 2019-04-19 DIAGNOSIS — E039 Hypothyroidism, unspecified: Secondary | ICD-10-CM

## 2019-04-20 DIAGNOSIS — E039 Hypothyroidism, unspecified: Secondary | ICD-10-CM | POA: Diagnosis not present

## 2019-04-20 DIAGNOSIS — N189 Chronic kidney disease, unspecified: Secondary | ICD-10-CM | POA: Diagnosis not present

## 2019-04-20 DIAGNOSIS — U071 COVID-19: Secondary | ICD-10-CM | POA: Diagnosis not present

## 2019-04-20 DIAGNOSIS — R739 Hyperglycemia, unspecified: Secondary | ICD-10-CM | POA: Diagnosis not present

## 2019-04-21 DIAGNOSIS — N189 Chronic kidney disease, unspecified: Secondary | ICD-10-CM | POA: Diagnosis not present

## 2019-04-21 DIAGNOSIS — R739 Hyperglycemia, unspecified: Secondary | ICD-10-CM | POA: Diagnosis not present

## 2019-04-21 DIAGNOSIS — U071 COVID-19: Secondary | ICD-10-CM | POA: Diagnosis not present

## 2019-04-21 DIAGNOSIS — E039 Hypothyroidism, unspecified: Secondary | ICD-10-CM | POA: Diagnosis not present

## 2019-04-22 DIAGNOSIS — N189 Chronic kidney disease, unspecified: Secondary | ICD-10-CM | POA: Diagnosis not present

## 2019-04-22 DIAGNOSIS — R739 Hyperglycemia, unspecified: Secondary | ICD-10-CM | POA: Diagnosis not present

## 2019-04-22 DIAGNOSIS — E039 Hypothyroidism, unspecified: Secondary | ICD-10-CM | POA: Diagnosis not present

## 2019-04-22 DIAGNOSIS — U071 COVID-19: Secondary | ICD-10-CM | POA: Diagnosis not present

## 2019-04-23 DIAGNOSIS — N189 Chronic kidney disease, unspecified: Secondary | ICD-10-CM | POA: Diagnosis not present

## 2019-04-23 DIAGNOSIS — U071 COVID-19: Secondary | ICD-10-CM | POA: Diagnosis not present

## 2019-04-23 DIAGNOSIS — R739 Hyperglycemia, unspecified: Secondary | ICD-10-CM | POA: Diagnosis not present

## 2019-04-23 DIAGNOSIS — E039 Hypothyroidism, unspecified: Secondary | ICD-10-CM | POA: Diagnosis not present

## 2019-05-07 DIAGNOSIS — R269 Unspecified abnormalities of gait and mobility: Secondary | ICD-10-CM

## 2019-05-07 DIAGNOSIS — J189 Pneumonia, unspecified organism: Secondary | ICD-10-CM

## 2019-05-07 DIAGNOSIS — F259 Schizoaffective disorder, unspecified: Secondary | ICD-10-CM

## 2019-05-07 DIAGNOSIS — N183 Chronic kidney disease, stage 3 unspecified: Secondary | ICD-10-CM

## 2019-05-07 DIAGNOSIS — L89152 Pressure ulcer of sacral region, stage 2: Secondary | ICD-10-CM

## 2019-05-07 DIAGNOSIS — A419 Sepsis, unspecified organism: Secondary | ICD-10-CM

## 2019-05-08 DIAGNOSIS — F259 Schizoaffective disorder, unspecified: Secondary | ICD-10-CM | POA: Diagnosis not present

## 2019-05-08 DIAGNOSIS — J189 Pneumonia, unspecified organism: Secondary | ICD-10-CM | POA: Diagnosis not present

## 2019-05-08 DIAGNOSIS — L89152 Pressure ulcer of sacral region, stage 2: Secondary | ICD-10-CM | POA: Diagnosis not present

## 2019-05-08 DIAGNOSIS — R269 Unspecified abnormalities of gait and mobility: Secondary | ICD-10-CM | POA: Diagnosis not present

## 2019-05-09 DIAGNOSIS — R269 Unspecified abnormalities of gait and mobility: Secondary | ICD-10-CM | POA: Diagnosis not present

## 2019-05-09 DIAGNOSIS — F259 Schizoaffective disorder, unspecified: Secondary | ICD-10-CM | POA: Diagnosis not present

## 2019-05-09 DIAGNOSIS — J189 Pneumonia, unspecified organism: Secondary | ICD-10-CM | POA: Diagnosis not present

## 2019-05-09 DIAGNOSIS — I361 Nonrheumatic tricuspid (valve) insufficiency: Secondary | ICD-10-CM

## 2019-05-09 DIAGNOSIS — I340 Nonrheumatic mitral (valve) insufficiency: Secondary | ICD-10-CM

## 2019-05-09 DIAGNOSIS — L89152 Pressure ulcer of sacral region, stage 2: Secondary | ICD-10-CM | POA: Diagnosis not present

## 2019-05-09 DIAGNOSIS — I351 Nonrheumatic aortic (valve) insufficiency: Secondary | ICD-10-CM

## 2019-05-10 DIAGNOSIS — R269 Unspecified abnormalities of gait and mobility: Secondary | ICD-10-CM | POA: Diagnosis not present

## 2019-05-10 DIAGNOSIS — F259 Schizoaffective disorder, unspecified: Secondary | ICD-10-CM | POA: Diagnosis not present

## 2019-05-10 DIAGNOSIS — L89152 Pressure ulcer of sacral region, stage 2: Secondary | ICD-10-CM | POA: Diagnosis not present

## 2019-05-10 DIAGNOSIS — J189 Pneumonia, unspecified organism: Secondary | ICD-10-CM | POA: Diagnosis not present

## 2019-05-11 DIAGNOSIS — Z79899 Other long term (current) drug therapy: Secondary | ICD-10-CM

## 2019-05-11 DIAGNOSIS — J189 Pneumonia, unspecified organism: Secondary | ICD-10-CM | POA: Diagnosis not present

## 2019-05-11 DIAGNOSIS — N183 Chronic kidney disease, stage 3 unspecified: Secondary | ICD-10-CM

## 2019-05-11 DIAGNOSIS — F259 Schizoaffective disorder, unspecified: Secondary | ICD-10-CM | POA: Diagnosis not present

## 2019-05-11 DIAGNOSIS — I472 Ventricular tachycardia: Secondary | ICD-10-CM

## 2019-05-11 DIAGNOSIS — Z95810 Presence of automatic (implantable) cardiac defibrillator: Secondary | ICD-10-CM

## 2019-05-11 DIAGNOSIS — L89152 Pressure ulcer of sacral region, stage 2: Secondary | ICD-10-CM

## 2019-05-11 DIAGNOSIS — J9601 Acute respiratory failure with hypoxia: Secondary | ICD-10-CM

## 2019-05-11 DIAGNOSIS — R269 Unspecified abnormalities of gait and mobility: Secondary | ICD-10-CM | POA: Diagnosis not present

## 2019-05-11 DIAGNOSIS — E876 Hypokalemia: Secondary | ICD-10-CM

## 2019-05-11 DIAGNOSIS — I5020 Unspecified systolic (congestive) heart failure: Secondary | ICD-10-CM

## 2019-05-12 DIAGNOSIS — Z79899 Other long term (current) drug therapy: Secondary | ICD-10-CM

## 2019-05-12 DIAGNOSIS — Z95810 Presence of automatic (implantable) cardiac defibrillator: Secondary | ICD-10-CM

## 2019-05-12 DIAGNOSIS — R269 Unspecified abnormalities of gait and mobility: Secondary | ICD-10-CM | POA: Diagnosis not present

## 2019-05-12 DIAGNOSIS — L89152 Pressure ulcer of sacral region, stage 2: Secondary | ICD-10-CM

## 2019-05-12 DIAGNOSIS — J9601 Acute respiratory failure with hypoxia: Secondary | ICD-10-CM

## 2019-05-12 DIAGNOSIS — F259 Schizoaffective disorder, unspecified: Secondary | ICD-10-CM | POA: Diagnosis not present

## 2019-05-12 DIAGNOSIS — N183 Chronic kidney disease, stage 3 unspecified: Secondary | ICD-10-CM

## 2019-05-12 DIAGNOSIS — J189 Pneumonia, unspecified organism: Secondary | ICD-10-CM | POA: Diagnosis not present

## 2019-05-12 DIAGNOSIS — E876 Hypokalemia: Secondary | ICD-10-CM

## 2019-05-12 DIAGNOSIS — I472 Ventricular tachycardia: Secondary | ICD-10-CM

## 2019-05-12 DIAGNOSIS — I5020 Unspecified systolic (congestive) heart failure: Secondary | ICD-10-CM

## 2019-05-13 DIAGNOSIS — J189 Pneumonia, unspecified organism: Secondary | ICD-10-CM | POA: Diagnosis not present

## 2019-05-13 DIAGNOSIS — L89152 Pressure ulcer of sacral region, stage 2: Secondary | ICD-10-CM | POA: Diagnosis not present

## 2019-05-13 DIAGNOSIS — F259 Schizoaffective disorder, unspecified: Secondary | ICD-10-CM | POA: Diagnosis not present

## 2019-05-13 DIAGNOSIS — N183 Chronic kidney disease, stage 3 unspecified: Secondary | ICD-10-CM | POA: Diagnosis not present

## 2019-05-13 DIAGNOSIS — I5020 Unspecified systolic (congestive) heart failure: Secondary | ICD-10-CM | POA: Diagnosis not present

## 2019-05-13 DIAGNOSIS — R269 Unspecified abnormalities of gait and mobility: Secondary | ICD-10-CM | POA: Diagnosis not present

## 2019-05-13 DIAGNOSIS — Z95810 Presence of automatic (implantable) cardiac defibrillator: Secondary | ICD-10-CM | POA: Diagnosis not present

## 2019-05-17 DIAGNOSIS — J189 Pneumonia, unspecified organism: Secondary | ICD-10-CM

## 2019-05-17 DIAGNOSIS — E860 Dehydration: Secondary | ICD-10-CM

## 2019-05-17 DIAGNOSIS — I5020 Unspecified systolic (congestive) heart failure: Secondary | ICD-10-CM

## 2019-05-17 DIAGNOSIS — E039 Hypothyroidism, unspecified: Secondary | ICD-10-CM

## 2019-05-17 DIAGNOSIS — N179 Acute kidney failure, unspecified: Secondary | ICD-10-CM

## 2019-05-18 DIAGNOSIS — N179 Acute kidney failure, unspecified: Secondary | ICD-10-CM | POA: Diagnosis not present

## 2019-05-18 DIAGNOSIS — J189 Pneumonia, unspecified organism: Secondary | ICD-10-CM | POA: Diagnosis not present

## 2019-05-18 DIAGNOSIS — I5020 Unspecified systolic (congestive) heart failure: Secondary | ICD-10-CM | POA: Diagnosis not present

## 2019-05-18 DIAGNOSIS — E860 Dehydration: Secondary | ICD-10-CM | POA: Diagnosis not present

## 2019-05-19 DIAGNOSIS — J189 Pneumonia, unspecified organism: Secondary | ICD-10-CM | POA: Diagnosis not present

## 2019-05-19 DIAGNOSIS — I5020 Unspecified systolic (congestive) heart failure: Secondary | ICD-10-CM | POA: Diagnosis not present

## 2019-05-19 DIAGNOSIS — E860 Dehydration: Secondary | ICD-10-CM | POA: Diagnosis not present

## 2019-05-19 DIAGNOSIS — N179 Acute kidney failure, unspecified: Secondary | ICD-10-CM | POA: Diagnosis not present

## 2019-05-20 DIAGNOSIS — N179 Acute kidney failure, unspecified: Secondary | ICD-10-CM | POA: Diagnosis not present

## 2019-05-20 DIAGNOSIS — J189 Pneumonia, unspecified organism: Secondary | ICD-10-CM | POA: Diagnosis not present

## 2019-05-20 DIAGNOSIS — E860 Dehydration: Secondary | ICD-10-CM | POA: Diagnosis not present

## 2019-05-20 DIAGNOSIS — I5020 Unspecified systolic (congestive) heart failure: Secondary | ICD-10-CM | POA: Diagnosis not present

## 2019-05-21 DIAGNOSIS — J189 Pneumonia, unspecified organism: Secondary | ICD-10-CM | POA: Diagnosis not present

## 2019-05-21 DIAGNOSIS — I5020 Unspecified systolic (congestive) heart failure: Secondary | ICD-10-CM | POA: Diagnosis not present

## 2019-05-21 DIAGNOSIS — N179 Acute kidney failure, unspecified: Secondary | ICD-10-CM | POA: Diagnosis not present

## 2019-05-21 DIAGNOSIS — E860 Dehydration: Secondary | ICD-10-CM | POA: Diagnosis not present

## 2019-05-22 DIAGNOSIS — E039 Hypothyroidism, unspecified: Secondary | ICD-10-CM | POA: Diagnosis not present

## 2019-05-22 DIAGNOSIS — E860 Dehydration: Secondary | ICD-10-CM | POA: Diagnosis not present

## 2019-05-22 DIAGNOSIS — N179 Acute kidney failure, unspecified: Secondary | ICD-10-CM | POA: Diagnosis not present

## 2019-05-22 DIAGNOSIS — J189 Pneumonia, unspecified organism: Secondary | ICD-10-CM | POA: Diagnosis not present

## 2019-05-23 DIAGNOSIS — I5020 Unspecified systolic (congestive) heart failure: Secondary | ICD-10-CM | POA: Diagnosis not present

## 2019-05-23 DIAGNOSIS — E860 Dehydration: Secondary | ICD-10-CM | POA: Diagnosis not present

## 2019-05-23 DIAGNOSIS — J189 Pneumonia, unspecified organism: Secondary | ICD-10-CM | POA: Diagnosis not present

## 2019-05-23 DIAGNOSIS — N179 Acute kidney failure, unspecified: Secondary | ICD-10-CM | POA: Diagnosis not present

## 2019-05-24 DIAGNOSIS — E860 Dehydration: Secondary | ICD-10-CM | POA: Diagnosis not present

## 2019-05-24 DIAGNOSIS — N179 Acute kidney failure, unspecified: Secondary | ICD-10-CM | POA: Diagnosis not present

## 2019-05-24 DIAGNOSIS — J189 Pneumonia, unspecified organism: Secondary | ICD-10-CM | POA: Diagnosis not present

## 2019-05-24 DIAGNOSIS — I5020 Unspecified systolic (congestive) heart failure: Secondary | ICD-10-CM | POA: Diagnosis not present

## 2019-05-25 DIAGNOSIS — E860 Dehydration: Secondary | ICD-10-CM | POA: Diagnosis not present

## 2019-05-25 DIAGNOSIS — I5020 Unspecified systolic (congestive) heart failure: Secondary | ICD-10-CM | POA: Diagnosis not present

## 2019-05-25 DIAGNOSIS — J189 Pneumonia, unspecified organism: Secondary | ICD-10-CM | POA: Diagnosis not present

## 2019-05-25 DIAGNOSIS — N179 Acute kidney failure, unspecified: Secondary | ICD-10-CM | POA: Diagnosis not present

## 2019-05-26 ENCOUNTER — Inpatient Hospital Stay
Admission: AD | Admit: 2019-05-26 | Payer: Medicare Other | Source: Other Acute Inpatient Hospital | Admitting: Internal Medicine

## 2019-05-26 DIAGNOSIS — N179 Acute kidney failure, unspecified: Secondary | ICD-10-CM | POA: Diagnosis not present

## 2019-05-26 DIAGNOSIS — E860 Dehydration: Secondary | ICD-10-CM | POA: Diagnosis not present

## 2019-05-26 DIAGNOSIS — J189 Pneumonia, unspecified organism: Secondary | ICD-10-CM | POA: Diagnosis not present

## 2019-05-26 DIAGNOSIS — I5020 Unspecified systolic (congestive) heart failure: Secondary | ICD-10-CM | POA: Diagnosis not present

## 2019-06-23 ENCOUNTER — Inpatient Hospital Stay
Admission: RE | Admit: 2019-06-23 | Discharge: 2019-08-18 | Disposition: A | Discharge status: Skilled Nursing Facility | Payer: Medicare Other | Attending: Internal Medicine | Admitting: Internal Medicine

## 2019-06-23 ENCOUNTER — Other Ambulatory Visit (HOSPITAL_COMMUNITY): Payer: Medicare Other

## 2019-06-23 DIAGNOSIS — A419 Sepsis, unspecified organism: Secondary | ICD-10-CM | POA: Diagnosis present

## 2019-06-23 DIAGNOSIS — D729 Disorder of white blood cells, unspecified: Secondary | ICD-10-CM

## 2019-06-23 DIAGNOSIS — K567 Ileus, unspecified: Secondary | ICD-10-CM

## 2019-06-23 DIAGNOSIS — M009 Pyogenic arthritis, unspecified: Secondary | ICD-10-CM

## 2019-06-23 DIAGNOSIS — Z8679 Personal history of other diseases of the circulatory system: Secondary | ICD-10-CM

## 2019-06-23 DIAGNOSIS — J9690 Respiratory failure, unspecified, unspecified whether with hypoxia or hypercapnia: Secondary | ICD-10-CM

## 2019-06-23 DIAGNOSIS — Z431 Encounter for attention to gastrostomy: Secondary | ICD-10-CM

## 2019-06-23 DIAGNOSIS — J189 Pneumonia, unspecified organism: Secondary | ICD-10-CM | POA: Diagnosis present

## 2019-06-23 DIAGNOSIS — J90 Pleural effusion, not elsewhere classified: Secondary | ICD-10-CM

## 2019-06-23 DIAGNOSIS — Z452 Encounter for adjustment and management of vascular access device: Secondary | ICD-10-CM

## 2019-06-23 DIAGNOSIS — I482 Chronic atrial fibrillation, unspecified: Secondary | ICD-10-CM | POA: Diagnosis present

## 2019-06-23 DIAGNOSIS — U071 COVID-19: Secondary | ICD-10-CM | POA: Diagnosis present

## 2019-06-23 DIAGNOSIS — R140 Abdominal distension (gaseous): Secondary | ICD-10-CM

## 2019-06-23 DIAGNOSIS — G9341 Metabolic encephalopathy: Secondary | ICD-10-CM | POA: Diagnosis present

## 2019-06-23 DIAGNOSIS — Z9889 Other specified postprocedural states: Secondary | ICD-10-CM

## 2019-06-23 DIAGNOSIS — J9621 Acute and chronic respiratory failure with hypoxia: Secondary | ICD-10-CM | POA: Diagnosis present

## 2019-06-23 HISTORY — DX: COVID-19: U07.1

## 2019-06-23 HISTORY — DX: Metabolic encephalopathy: G93.41

## 2019-06-23 HISTORY — DX: Severe sepsis without septic shock: R65.20

## 2019-06-23 HISTORY — DX: Pneumonia, unspecified organism: J18.9

## 2019-06-23 HISTORY — DX: Severe sepsis without septic shock: A41.9

## 2019-06-23 HISTORY — DX: Acute and chronic respiratory failure with hypoxia: J96.21

## 2019-06-23 HISTORY — DX: Chronic atrial fibrillation, unspecified: I48.20

## 2019-06-23 LAB — BLOOD GAS, ARTERIAL
Acid-Base Excess: 13 mmol/L — ABNORMAL HIGH (ref 0.0–2.0)
Bicarbonate: 38.9 mmol/L — ABNORMAL HIGH (ref 20.0–28.0)
FIO2: 35
O2 Saturation: 95.1 %
Patient temperature: 37
pCO2 arterial: 70.5 mmHg (ref 32.0–48.0)
pH, Arterial: 7.36 (ref 7.350–7.450)
pO2, Arterial: 66.5 mmHg — ABNORMAL LOW (ref 83.0–108.0)

## 2019-06-24 DIAGNOSIS — J189 Pneumonia, unspecified organism: Secondary | ICD-10-CM

## 2019-06-24 DIAGNOSIS — I482 Chronic atrial fibrillation, unspecified: Secondary | ICD-10-CM

## 2019-06-24 DIAGNOSIS — J9621 Acute and chronic respiratory failure with hypoxia: Secondary | ICD-10-CM

## 2019-06-24 DIAGNOSIS — R6520 Severe sepsis without septic shock: Secondary | ICD-10-CM

## 2019-06-24 DIAGNOSIS — A419 Sepsis, unspecified organism: Secondary | ICD-10-CM

## 2019-06-24 DIAGNOSIS — U071 COVID-19: Secondary | ICD-10-CM | POA: Diagnosis not present

## 2019-06-24 DIAGNOSIS — G9341 Metabolic encephalopathy: Secondary | ICD-10-CM

## 2019-06-24 LAB — CBC WITH DIFFERENTIAL/PLATELET
Abs Immature Granulocytes: 0.08 10*3/uL — ABNORMAL HIGH (ref 0.00–0.07)
Basophils Absolute: 0 10*3/uL (ref 0.0–0.1)
Basophils Relative: 0 %
Eosinophils Absolute: 0.5 10*3/uL (ref 0.0–0.5)
Eosinophils Relative: 5 %
HCT: 27.2 % — ABNORMAL LOW (ref 39.0–52.0)
Hemoglobin: 7.9 g/dL — ABNORMAL LOW (ref 13.0–17.0)
Immature Granulocytes: 1 %
Lymphocytes Relative: 14 %
Lymphs Abs: 1.4 10*3/uL (ref 0.7–4.0)
MCH: 29.9 pg (ref 26.0–34.0)
MCHC: 29 g/dL — ABNORMAL LOW (ref 30.0–36.0)
MCV: 103 fL — ABNORMAL HIGH (ref 80.0–100.0)
Monocytes Absolute: 1.1 10*3/uL — ABNORMAL HIGH (ref 0.1–1.0)
Monocytes Relative: 11 %
Neutro Abs: 7.2 10*3/uL (ref 1.7–7.7)
Neutrophils Relative %: 69 %
Platelets: 134 10*3/uL — ABNORMAL LOW (ref 150–400)
RBC: 2.64 MIL/uL — ABNORMAL LOW (ref 4.22–5.81)
RDW: 22 % — ABNORMAL HIGH (ref 11.5–15.5)
WBC: 10.3 10*3/uL (ref 4.0–10.5)
nRBC: 0 % (ref 0.0–0.2)

## 2019-06-24 LAB — COMPREHENSIVE METABOLIC PANEL
ALT: 22 U/L (ref 0–44)
AST: 28 U/L (ref 15–41)
Albumin: 2 g/dL — ABNORMAL LOW (ref 3.5–5.0)
Alkaline Phosphatase: 157 U/L — ABNORMAL HIGH (ref 38–126)
Anion gap: 6 (ref 5–15)
BUN: 51 mg/dL — ABNORMAL HIGH (ref 8–23)
CO2: 39 mmol/L — ABNORMAL HIGH (ref 22–32)
Calcium: 9.1 mg/dL (ref 8.9–10.3)
Chloride: 109 mmol/L (ref 98–111)
Creatinine, Ser: 1.32 mg/dL — ABNORMAL HIGH (ref 0.61–1.24)
GFR calc Af Amer: >60 mL/min (ref >60)
GFR calc non Af Amer: 53 mL/min — ABNORMAL LOW (ref >60)
Glucose, Bld: 186 mg/dL — ABNORMAL HIGH (ref 70–99)
Potassium: 3.6 mmol/L (ref 3.5–5.1)
Sodium: 154 mmol/L — ABNORMAL HIGH (ref 135–145)
Total Bilirubin: 0.3 mg/dL (ref 0.3–1.2)
Total Protein: 5.7 g/dL — ABNORMAL LOW (ref 6.5–8.1)

## 2019-06-24 LAB — URINALYSIS, ROUTINE W REFLEX MICROSCOPIC
Bilirubin Urine: NEGATIVE
Glucose, UA: NEGATIVE mg/dL
Ketones, ur: NEGATIVE mg/dL
Nitrite: NEGATIVE
Protein, ur: 30 mg/dL — AB
RBC / HPF: >50 RBC/hpf — ABNORMAL HIGH (ref 0–5)
Specific Gravity, Urine: 1.006 (ref 1.005–1.030)
WBC, UA: >50 WBC/hpf — ABNORMAL HIGH (ref 0–5)
pH: 7 (ref 5.0–8.0)

## 2019-06-24 LAB — HEMOGLOBIN A1C
Hgb A1c MFr Bld: 5.9 % — ABNORMAL HIGH (ref 4.8–5.6)
Mean Plasma Glucose: 123 mg/dL

## 2019-06-24 NOTE — Consult Note (Signed)
Pulmonary Critical Care Medicine [facility] [place]  PULMONARY SERVICE  Date of Service: 06/24/2019  PULMONARY CRITICAL CARE CONSULT   Henry Mcbride  EQA:[id number]  DOB: 08-28-1945   DOA: 06/23/2019  Referring Physician: Carron Curie, MD  HPI: Henry Mcbride is a 74 y.o. male seen for follow up of Acute on Chronic Respiratory Failure. Patient has multiple medical problems including chronic kidney disease CHF diabetes mellitus hypertension hypothyroidism mental disorder schizoaffective who presented to the hospital with increasing shortness of breath. Patient had been diagnosed with COVID-19 virus infection and pneumonia. Patient had worsening of his renal function and also had altered mental status and weakness. In the emergency department patient was significantly hypotensive with a creatinine of 4.0 along with this patient had an right middle basilar infiltrate. Also patient was noted to be in heart failure had an echocardiogram which revealed the reduced ejection fraction. Patient subsequently ended up on the ventilator subsequently was not able to wean off the ventilator and ended up having to have a tracheostomy. Patient now presents to our facility for further management and weaning.  Review of Systems:  ROS performed and is unremarkable other than noted above.  Past Medical History:  Diagnosis Date  . Anxiety   . Arthritis   . CHF (congestive heart failure) (HCC)    dilated non-ischemic cardiomyopathy; followed at [facility]  . CKD (chronic kidney disease)   . Cyst of kidney, acquired   . Depression   . Diabetes mellitus without complication (HCC)   . Hypertension   . Hypothyroidism   . Mental disorder   . Schizo affective schizophrenia (HCC)   . Seizures (HCC)    last was 3-4 years ago  . Shortness of breath   . Tardive dyskinesia     Past Surgical History:  Procedure Laterality Date  . ANKLE ARTHROSCOPY  right  . COLON RESECTION  July  2014   had mass surgery in [place]  . COLONOSCOPY    . HERNIA REPAIR     times 2  . POSTERIOR CERVICAL FUSION/FORAMINOTOMY N/A 10/28/2012   Procedure: CERVICAL TWO THREE, CERVICAL THREE FOUR, CERVICAL FOUR FIVE, CERVICAL FIVE SIX, CERVICAL SIX SEVEN POSTERIOR CERVICAL FUSION WITH LATERAL MASS FIXATION;  Surgeon: Temple Pacini, MD;  Location: [facility];  Service: Neurosurgery;  Laterality: N/A;  POSTERIOR CERVICAL FUSION/FORAMINOTOMY LEVEL 5  . TRANSTHORACIC ECHOCARDIOGRAM  06/20/12   mild to moderate LV systolic dysfunction with akinesisa of basilar and mild inferolateral segments, EF 45% (visual 40%), impaired relaxation, mildly dilated LA  . WRIST ARTHROPLASTY     at age 8    Social History:    reports that he has never smoked. He has never used smokeless tobacco. He reports that he does not drink alcohol or use drugs.  Family History: Non-Contributory to the present illness  Allergies  Allergen Reactions  . Lithium Diarrhea and Other (See Comments)    Frequency in urination  Frequent urination    Medications: Reviewed on Rounds  Physical Exam:  Vitals: Temperature 95.1 pulse 78 respiratory 30 blood pressure is 113/65 saturations 97%  Ventilator Settings mode ventilation assist control FiO2 35% tidal volume 498 PEEP 5  . General: Comfortable at this time . Eyes: Grossly normal lids, irises & conjunctiva . ENT: grossly tongue is normal . Neck: no obvious mass . Cardiovascular: S1-S2 normal no gallop or rub . Respiratory: No rhonchi coarse breath sounds . Abdomen: Soft and nontender . Skin: no rash seen on limited exam .  Musculoskeletal: not rigid . Psychiatric:unable to assess . Neurologic: no seizure no involuntary movements         Labs on Admission:  Basic Metabolic Panel: Recent Labs  Lab 06/24/19 0533  NA 154*  K 3.6  CL 109  CO2 39*  GLUCOSE 186*  BUN 51*  CREATININE 1.32*  CALCIUM 9.1    Recent Labs  Lab 06/23/19 1530  PHART 7.360  PCO2ART  70.5*  PO2ART 66.5*  HCO3 38.9*  O2SAT 95.1    Liver Function Tests: Recent Labs  Lab 06/24/19 0533  AST 28  ALT 22  ALKPHOS 157*  BILITOT 0.3  PROT 5.7*  ALBUMIN 2.0*   No results for input(s): LIPASE, AMYLASE in the last 168 hours. No results for input(s): AMMONIA in the last 168 hours.  CBC: Recent Labs  Lab 06/24/19 0533  WBC 10.3  NEUTROABS 7.2  HGB 7.9*  HCT 27.2*  MCV 103.0*  PLT 134*    Cardiac Enzymes: No results for input(s): CKTOTAL, CKMB, CKMBINDEX, TROPONINI in the last 168 hours.  BNP (last 3 results) No results for input(s): BNP in the last 8760 hours.  ProBNP (last 3 results) No results for input(s): PROBNP in the last 8760 hours.   Radiological Exams on Admission: DG Abd 1 View  Result Date: 06/23/2019 CLINICAL DATA:  Check gastrostomy placement EXAM: ABDOMEN - 1 VIEW COMPARISON:  None. FINDINGS: Gastrostomy catheter was injected with contrast material. Contrast flows freely into the stomach and subsequently the small bowel. IMPRESSION: Gastrostomy catheter within the stomach. Electronically Signed   By: Inez Catalina M.D.   On: 06/23/2019 16:09   DG CHEST PORT 1 VIEW  Result Date: 06/23/2019 CLINICAL DATA:  Follow-up infiltrates EXAM: PORTABLE CHEST 1 VIEW COMPARISON:  05/24/2019 FINDINGS: Cardiac shadow is enlarged but stable. Defibrillator and tracheostomy tube are now seen. Left lung is well aerated with some patchy upper lobe infiltrate slightly improved from the prior exam. Improved aeration is noted on the right although persistent opacities are noted throughout the right lung. IMPRESSION: Changes of multifocal pneumonia somewhat improved from the prior exam. Electronically Signed   By: Inez Catalina M.D.   On: 06/23/2019 16:08    Assessment/Plan Active Problems:   Acute on chronic respiratory failure with hypoxia (HCC)   Chronic atrial fibrillation ([place])   COVID-19 virus infection   Severe sepsis (HCC)   Multifocal pneumonia    Metabolic encephalopathy   1. Acute on chronic respiratory failure hypoxia right now patient is on full support on the ventilator.  Has been on assist control with a 35% FiO2 with a PEEP of 5.  I have spoken with respiratory therapy about doing a RSB I mechanics and try to see if patient is ready to be actually weaned.  Will progress depending on the results right now he does appear to be agitated confused. 2. COVID-19 virus infection in resolution phase plan is going to be to continue to follow supportive care we will continue to monitor closely. 3. Severe sepsis with shock patient apparently was treated aggressively with antibiotics has some improvement we will continue with supportive care. 4. Chronic encephalopathy will monitor for any progression or improvement. 5. Chronic atrial fibrillation rate is controlled at this time continue with supportive care. 6. Multifocal pneumonia likely secondary to the COVID-19 patient does have residual changes noted on the chest x-ray we will continue to monitor.  I have personally seen and evaluated the patient, evaluated laboratory and imaging results, formulated the assessment and  plan and placed orders. The Patient requires high complexity decision making with multiple systems involvement.  Case was discussed on Rounds with the Respiratory Therapy Director and the Respiratory staff Time Spent  Yevonne Pax, MD [facility] Pulmonary Critical Care Medicine Sleep Medicine

## 2019-06-25 LAB — BASIC METABOLIC PANEL
Anion gap: 6 (ref 5–15)
BUN: 55 mg/dL — ABNORMAL HIGH (ref 8–23)
CO2: 40 mmol/L — ABNORMAL HIGH (ref 22–32)
Calcium: 9.4 mg/dL (ref 8.9–10.3)
Chloride: 104 mmol/L (ref 98–111)
Creatinine, Ser: 1.39 mg/dL — ABNORMAL HIGH (ref 0.61–1.24)
GFR calc Af Amer: 57 mL/min — ABNORMAL LOW (ref >60)
GFR calc non Af Amer: 50 mL/min — ABNORMAL LOW (ref >60)
Glucose, Bld: 189 mg/dL — ABNORMAL HIGH (ref 70–99)
Potassium: 3.5 mmol/L (ref 3.5–5.1)
Sodium: 150 mmol/L — ABNORMAL HIGH (ref 135–145)

## 2019-06-25 LAB — CBC
HCT: 23.1 % — ABNORMAL LOW (ref 39.0–52.0)
Hemoglobin: 6.8 g/dL — CL (ref 13.0–17.0)
MCH: 30.2 pg (ref 26.0–34.0)
MCHC: 29.4 g/dL — ABNORMAL LOW (ref 30.0–36.0)
MCV: 102.7 fL — ABNORMAL HIGH (ref 80.0–100.0)
Platelets: 136 10*3/uL — ABNORMAL LOW (ref 150–400)
RBC: 2.25 MIL/uL — ABNORMAL LOW (ref 4.22–5.81)
RDW: 21.9 % — ABNORMAL HIGH (ref 11.5–15.5)
WBC: 9.6 10*3/uL (ref 4.0–10.5)
nRBC: 0.3 % — ABNORMAL HIGH (ref 0.0–0.2)

## 2019-06-25 LAB — URINE CULTURE: Culture: NO GROWTH

## 2019-06-25 LAB — PREPARE RBC (CROSSMATCH)

## 2019-06-26 DIAGNOSIS — G9341 Metabolic encephalopathy: Secondary | ICD-10-CM | POA: Diagnosis not present

## 2019-06-26 DIAGNOSIS — I482 Chronic atrial fibrillation, unspecified: Secondary | ICD-10-CM | POA: Diagnosis not present

## 2019-06-26 DIAGNOSIS — U071 COVID-19: Secondary | ICD-10-CM | POA: Diagnosis not present

## 2019-06-26 DIAGNOSIS — J9621 Acute and chronic respiratory failure with hypoxia: Secondary | ICD-10-CM | POA: Diagnosis not present

## 2019-06-26 LAB — TYPE AND SCREEN
ABO/RH(D): A POS
Antibody Screen: NEGATIVE
Unit division: 0

## 2019-06-26 LAB — BPAM RBC
Blood Product Expiration Date: 202105252359
ISSUE DATE / TIME: 202105191616
Unit Type and Rh: 6200

## 2019-06-26 LAB — HEMOGLOBIN AND HEMATOCRIT, BLOOD
HCT: 26 % — ABNORMAL LOW (ref 39.0–52.0)
Hemoglobin: 7.7 g/dL — ABNORMAL LOW (ref 13.0–17.0)

## 2019-06-26 NOTE — Progress Notes (Signed)
Pulmonary Critical Care Medicine [facility] [place]   PULMONARY CRITICAL CARE SERVICE  PROGRESS NOTE  Date of Service: 06/26/2019  Mouhamadou Gittleman  YTK:[id number]  DOB: 1945-04-22   DOA: 06/23/2019  Referring Physician: Carron Curie, MD  HPI: Jaycub Noorani is a 74 y.o. male seen for follow up of Acute on Chronic Respiratory Failure.  Patient currently is on pressure support has been on 40% FiO2 requiring pressure support 12/5 today's goal is for 8 hours  Medications: Reviewed on Rounds  Physical Exam:  Vitals: Temperature 96.6 pulse 73 respiratory rate 35 blood pressure is 110/56 saturations 100%  Ventilator Settings on pressure support FiO2 40% pressure support 12 PEEP 5  . General: Comfortable at this time . Eyes: Grossly normal lids, irises & conjunctiva . ENT: grossly tongue is normal . Neck: no obvious mass . Cardiovascular: S1 S2 normal no gallop . Respiratory: No rhonchi no rales are noted at this time . Abdomen: soft . Skin: no rash seen on limited exam . Musculoskeletal: not rigid . Psychiatric:unable to assess . Neurologic: no seizure no involuntary movements         Lab Data:   Basic Metabolic Panel: Recent Labs  Lab 06/24/19 0533 06/25/19 0749  NA 154* 150*  K 3.6 3.5  CL 109 104  CO2 39* 40*  GLUCOSE 186* 189*  BUN 51* 55*  CREATININE 1.32* 1.39*  CALCIUM 9.1 9.4    ABG: Recent Labs  Lab 06/23/19 1530  PHART 7.360  PCO2ART 70.5*  PO2ART 66.5*  HCO3 38.9*  O2SAT 95.1    Liver Function Tests: Recent Labs  Lab 06/24/19 0533  AST 28  ALT 22  ALKPHOS 157*  BILITOT 0.3  PROT 5.7*  ALBUMIN 2.0*   No results for input(s): LIPASE, AMYLASE in the last 168 hours. No results for input(s): AMMONIA in the last 168 hours.  CBC: Recent Labs  Lab 06/24/19 0533 06/25/19 0749 06/26/19 0753  WBC 10.3 9.6  --   NEUTROABS 7.2  --   --   HGB 7.9* 6.8* 7.7*  HCT 27.2* 23.1* 26.0*  MCV 103.0* 102.7*  --   PLT 134* 136*  --      Cardiac Enzymes: No results for input(s): CKTOTAL, CKMB, CKMBINDEX, TROPONINI in the last 168 hours.  BNP (last 3 results) No results for input(s): BNP in the last 8760 hours.  ProBNP (last 3 results) No results for input(s): PROBNP in the last 8760 hours.  Radiological Exams: No results found.  Assessment/Plan Active Problems:   Acute on chronic respiratory failure with hypoxia (HCC)   Chronic atrial fibrillation (HCC)   COVID-19 virus infection   Severe sepsis (HCC)   Multifocal pneumonia   Metabolic encephalopathy   1. Acute on chronic respiratory failure hypoxia plan is to continue with pressure support goal of 8 hours as tolerated.  So far patient appears to be doing well as we advance the wean 2. Chronic atrial fibrillation rate now rate controlled we will continue with supportive care. 3. COVID-19 virus infection in resolution continue present management 4. Severe sepsis resolved 5. Medical multifocal pneumonia treated we will continue to monitor 6. Metabolic encephalopathy no change   I have personally seen and evaluated the patient, evaluated laboratory and imaging results, formulated the assessment and plan and placed orders. The Patient requires high complexity decision making with multiple systems involvement.  Rounds were done with the Respiratory Therapy Director and Staff therapists and discussed with nursing staff also.  Yevonne Pax, MD  [facility] Pulmonary Critical Care Medicine Sleep Medicine

## 2019-06-28 ENCOUNTER — Encounter: Payer: Self-pay | Admitting: Internal Medicine

## 2019-06-28 DIAGNOSIS — J9621 Acute and chronic respiratory failure with hypoxia: Secondary | ICD-10-CM | POA: Diagnosis present

## 2019-06-28 DIAGNOSIS — J189 Pneumonia, unspecified organism: Secondary | ICD-10-CM | POA: Diagnosis present

## 2019-06-28 DIAGNOSIS — U071 COVID-19: Secondary | ICD-10-CM | POA: Diagnosis not present

## 2019-06-28 DIAGNOSIS — F0781 Postconcussional syndrome: Secondary | ICD-10-CM | POA: Insufficient documentation

## 2019-06-28 DIAGNOSIS — G9341 Metabolic encephalopathy: Secondary | ICD-10-CM | POA: Diagnosis not present

## 2019-06-28 DIAGNOSIS — A419 Sepsis, unspecified organism: Secondary | ICD-10-CM | POA: Diagnosis present

## 2019-06-28 DIAGNOSIS — R6520 Severe sepsis without septic shock: Secondary | ICD-10-CM | POA: Diagnosis present

## 2019-06-28 DIAGNOSIS — I482 Chronic atrial fibrillation, unspecified: Secondary | ICD-10-CM | POA: Diagnosis not present

## 2019-06-28 LAB — CBC
HCT: 23.9 % — ABNORMAL LOW (ref 39.0–52.0)
Hemoglobin: 7.2 g/dL — ABNORMAL LOW (ref 13.0–17.0)
MCH: 30.4 pg (ref 26.0–34.0)
MCHC: 30.1 g/dL (ref 30.0–36.0)
MCV: 100.8 fL — ABNORMAL HIGH (ref 80.0–100.0)
Platelets: 105 10*3/uL — ABNORMAL LOW (ref 150–400)
RBC: 2.37 MIL/uL — ABNORMAL LOW (ref 4.22–5.81)
RDW: 20.5 % — ABNORMAL HIGH (ref 11.5–15.5)
WBC: 8 10*3/uL (ref 4.0–10.5)
nRBC: 0.3 % — ABNORMAL HIGH (ref 0.0–0.2)

## 2019-06-28 LAB — BLOOD GAS, ARTERIAL
Acid-Base Excess: 16.7 mmol/L — ABNORMAL HIGH (ref 0.0–2.0)
Bicarbonate: 42.8 mmol/L — ABNORMAL HIGH (ref 20.0–28.0)
FIO2: 40
O2 Saturation: 96.8 %
Patient temperature: 37
pCO2 arterial: 73 mmHg (ref 32.0–48.0)
pH, Arterial: 7.384 (ref 7.350–7.450)
pO2, Arterial: 77.2 mmHg — ABNORMAL LOW (ref 83.0–108.0)

## 2019-06-28 NOTE — Progress Notes (Signed)
Pulmonary Critical Care Medicine [facility] [place]   PULMONARY CRITICAL CARE SERVICE  PROGRESS NOTE  Date of Service: 06/28/2019  Autrey Human  [id number]  DOB: Dec 17, 1945   DOA: 06/23/2019  Referring Physician: Carron Curie, MD  HPI: Braidon Chermak is a 74 y.o. male seen for follow up of Acute on Chronic Respiratory Failure. Patient currently is on full support on the ventilator is not weaning yet this morning respiratory therapy is going to reassess to begin the wean  Medications: Reviewed on Rounds  Physical Exam:  Vitals: Temperature is 98.9 pulse 82 respiratory 23 blood pressure is 107/50 saturations 100%  Ventilator Settings on assist control FiO2 is 50% tidal volume 450 PEEP 5  . General: Comfortable at this time . Eyes: Grossly normal lids, irises & conjunctiva . ENT: grossly tongue is normal . Neck: no obvious mass . Cardiovascular: S1 S2 normal no gallop . Respiratory: No rhonchi coarse breath sounds . Abdomen: soft . Skin: no rash seen on limited exam . Musculoskeletal: not rigid . Psychiatric:unable to assess . Neurologic: no seizure no involuntary movements         Lab Data:   Basic Metabolic Panel: Recent Labs  Lab 06/24/19 0533 06/25/19 0749  NA 154* 150*  K 3.6 3.5  CL 109 104  CO2 39* 40*  GLUCOSE 186* 189*  BUN 51* 55*  CREATININE 1.32* 1.39*  CALCIUM 9.1 9.4    ABG: Recent Labs  Lab 06/23/19 1530 06/28/19 0312  PHART 7.360 7.384  PCO2ART 70.5* 73.0*  PO2ART 66.5* 77.2*  HCO3 38.9* 42.8*  O2SAT 95.1 96.8    Liver Function Tests: Recent Labs  Lab 06/24/19 0533  AST 28  ALT 22  ALKPHOS 157*  BILITOT 0.3  PROT 5.7*  ALBUMIN 2.0*   No results for input(s): LIPASE, AMYLASE in the last 168 hours. No results for input(s): AMMONIA in the last 168 hours.  CBC: Recent Labs  Lab 06/24/19 0533 06/25/19 0749 06/26/19 0753 06/28/19 1432  WBC 10.3 9.6  --  8.0  NEUTROABS 7.2  --   --   --   HGB 7.9* 6.8* 7.7*  7.2*  HCT 27.2* 23.1* 26.0* 23.9*  MCV 103.0* 102.7*  --  100.8*  PLT 134* 136*  --  105*    Cardiac Enzymes: No results for input(s): CKTOTAL, CKMB, CKMBINDEX, TROPONINI in the last 168 hours.  BNP (last 3 results) No results for input(s): BNP in the last 8760 hours.  ProBNP (last 3 results) No results for input(s): PROBNP in the last 8760 hours.  Radiological Exams: No results found.  Assessment/Plan Active Problems:   Acute on chronic respiratory failure with hypoxia (HCC)   Chronic atrial fibrillation (HCC)   COVID-19 virus infection   Severe sepsis (HCC)   Multifocal pneumonia   Metabolic encephalopathy   1. Acute on chronic respiratory failure hypoxia plan is going to be to continue with assist control for now on 50% FiO2 with PEEP of 5 respiratory therapy will assess the RSB I and try to wean again 2. Chronic atrial fibrillation rate is controlled we will continue with supportive care 3. COVID-19 virus infection resolved continue supportive care 4. Multifocal pneumonia at baseline 5. Metabolic encephalopathy no change continue to monitor closely   I have personally seen and evaluated the patient, evaluated laboratory and imaging results, formulated the assessment and plan and placed orders. The Patient requires high complexity decision making with multiple systems involvement.  Rounds were done with the Respiratory Therapy Director  and Staff therapists and discussed with nursing staff also.  Allyne Gee, MD [facility] Pulmonary Critical Care Medicine Sleep Medicine

## 2019-06-29 DIAGNOSIS — J9621 Acute and chronic respiratory failure with hypoxia: Secondary | ICD-10-CM | POA: Diagnosis not present

## 2019-06-29 DIAGNOSIS — G9341 Metabolic encephalopathy: Secondary | ICD-10-CM | POA: Diagnosis not present

## 2019-06-29 DIAGNOSIS — I482 Chronic atrial fibrillation, unspecified: Secondary | ICD-10-CM | POA: Diagnosis not present

## 2019-06-29 DIAGNOSIS — U071 COVID-19: Secondary | ICD-10-CM | POA: Diagnosis not present

## 2019-06-29 NOTE — Progress Notes (Signed)
Pulmonary Critical Care Medicine [facility] [place]   PULMONARY CRITICAL CARE SERVICE  PROGRESS NOTE  Date of Service: 06/29/2019  Teofil Maniaci  [id number]  DOB: 1945-12-29   DOA: 06/23/2019  Referring Physician: Carron Curie, MD  HPI: Henry Mcbride is a 74 y.o. male seen for follow up of Acute on Chronic Respiratory Failure.  Currently is on pressure support has been on 20/5 apparently was placed on that pressure at night by the other therapist and they stated that he was having some issues with him dyssynchrony with the ventilator.  Now appears to be comfortable at think we could probably drop the pressure support down further  Medications: Reviewed on Rounds  Physical Exam:  Vitals: Temperature is 98.4 pulse 87 respiratory 35 blood pressure is 99/53 saturations 96%  Ventilator Settings on pressure support FiO2 is 35% tidal volume 373 pressure of 20 PEEP 5  . General: Comfortable at this time . Eyes: Grossly normal lids, irises & conjunctiva . ENT: grossly tongue is normal . Neck: no obvious mass . Cardiovascular: S1 S2 normal no gallop . Respiratory: No rhonchi coarse breath sounds . Abdomen: soft . Skin: no rash seen on limited exam . Musculoskeletal: not rigid . Psychiatric:unable to assess . Neurologic: no seizure no involuntary movements         Lab Data:   Basic Metabolic Panel: Recent Labs  Lab 06/24/19 0533 06/25/19 0749  NA 154* 150*  K 3.6 3.5  CL 109 104  CO2 39* 40*  GLUCOSE 186* 189*  BUN 51* 55*  CREATININE 1.32* 1.39*  CALCIUM 9.1 9.4    ABG: Recent Labs  Lab 06/23/19 1530 06/28/19 0312  PHART 7.360 7.384  PCO2ART 70.5* 73.0*  PO2ART 66.5* 77.2*  HCO3 38.9* 42.8*  O2SAT 95.1 96.8    Liver Function Tests: Recent Labs  Lab 06/24/19 0533  AST 28  ALT 22  ALKPHOS 157*  BILITOT 0.3  PROT 5.7*  ALBUMIN 2.0*   No results for input(s): LIPASE, AMYLASE in the last 168 hours. No results for input(s): AMMONIA in the  last 168 hours.  CBC: Recent Labs  Lab 06/24/19 0533 06/25/19 0749 06/26/19 0753 06/28/19 1432  WBC 10.3 9.6  --  8.0  NEUTROABS 7.2  --   --   --   HGB 7.9* 6.8* 7.7* 7.2*  HCT 27.2* 23.1* 26.0* 23.9*  MCV 103.0* 102.7*  --  100.8*  PLT 134* 136*  --  105*    Cardiac Enzymes: No results for input(s): CKTOTAL, CKMB, CKMBINDEX, TROPONINI in the last 168 hours.  BNP (last 3 results) No results for input(s): BNP in the last 8760 hours.  ProBNP (last 3 results) No results for input(s): PROBNP in the last 8760 hours.  Radiological Exams: No results found.  Assessment/Plan Active Problems:   Acute on chronic respiratory failure with hypoxia (HCC)   Chronic atrial fibrillation (HCC)   COVID-19 virus infection   Severe sepsis (HCC)   Multifocal pneumonia   Metabolic encephalopathy   1. Acute on chronic respiratory failure with hypoxia I asked respiratory therapy to go ahead and wean the pressure support down. 2. COVID-19 virus infection resolved we will continue with supportive care 3. Chronic atrial fibrillation rate is controlled 4. Severe sepsis resolved hemodynamics are stable 5. Multifocal pneumonia patient is at baseline 6. Metabolic encephalopathy waxing and waning status we will continue to monitor   I have personally seen and evaluated the patient, evaluated laboratory and imaging results, formulated the assessment and  plan and placed orders. The Patient requires high complexity decision making with multiple systems involvement.  Rounds were done with the Respiratory Therapy Director and Staff therapists and discussed with nursing staff also.  Allyne Gee, MD [facility] Pulmonary Critical Care Medicine Sleep Medicine

## 2019-06-30 DIAGNOSIS — J9621 Acute and chronic respiratory failure with hypoxia: Secondary | ICD-10-CM | POA: Diagnosis not present

## 2019-06-30 DIAGNOSIS — G9341 Metabolic encephalopathy: Secondary | ICD-10-CM | POA: Diagnosis not present

## 2019-06-30 DIAGNOSIS — U071 COVID-19: Secondary | ICD-10-CM | POA: Diagnosis not present

## 2019-06-30 DIAGNOSIS — I482 Chronic atrial fibrillation, unspecified: Secondary | ICD-10-CM | POA: Diagnosis not present

## 2019-06-30 NOTE — Progress Notes (Signed)
Pulmonary Critical Care Medicine [facility] [place]   PULMONARY CRITICAL CARE SERVICE  PROGRESS NOTE  Date of Service: 06/30/2019  Henry Mcbride  [id number]  DOB: 1945/12/07   DOA: 06/23/2019  Referring Physician: Carron Curie, MD  HPI: Henry Mcbride is a 74 y.o. male seen for follow up of Acute on Chronic Respiratory Failure.  Patient was attempted on T collar however did not tolerate to be placed back on pressure support right now is on 40% FiO2 on pressure support  Medications: Reviewed on Rounds  Physical Exam:  Vitals: Temperature 97.9 pulse 83 respiratory 30 blood pressure is 109/56 saturations 95%  Ventilator Settings on pressure support FiO2 is 40% pressure 12 PEEP 5  . General: Comfortable at this time . Eyes: Grossly normal lids, irises & conjunctiva . ENT: grossly tongue is normal . Neck: no obvious mass . Cardiovascular: S1 S2 normal no gallop . Respiratory: No rhonchi coarse breath sounds . Abdomen: soft . Skin: no rash seen on limited exam . Musculoskeletal: not rigid . Psychiatric:unable to assess . Neurologic: no seizure no involuntary movements         Lab Data:   Basic Metabolic Panel: Recent Labs  Lab 06/24/19 0533 06/25/19 0749  NA 154* 150*  K 3.6 3.5  CL 109 104  CO2 39* 40*  GLUCOSE 186* 189*  BUN 51* 55*  CREATININE 1.32* 1.39*  CALCIUM 9.1 9.4    ABG: Recent Labs  Lab 06/23/19 1530 06/28/19 0312  PHART 7.360 7.384  PCO2ART 70.5* 73.0*  PO2ART 66.5* 77.2*  HCO3 38.9* 42.8*  O2SAT 95.1 96.8    Liver Function Tests: Recent Labs  Lab 06/24/19 0533  AST 28  ALT 22  ALKPHOS 157*  BILITOT 0.3  PROT 5.7*  ALBUMIN 2.0*   No results for input(s): LIPASE, AMYLASE in the last 168 hours. No results for input(s): AMMONIA in the last 168 hours.  CBC: Recent Labs  Lab 06/24/19 0533 06/25/19 0749 06/26/19 0753 06/28/19 1432  WBC 10.3 9.6  --  8.0  NEUTROABS 7.2  --   --   --   HGB 7.9* 6.8* 7.7* 7.2*  HCT  27.2* 23.1* 26.0* 23.9*  MCV 103.0* 102.7*  --  100.8*  PLT 134* 136*  --  105*    Cardiac Enzymes: No results for input(s): CKTOTAL, CKMB, CKMBINDEX, TROPONINI in the last 168 hours.  BNP (last 3 results) No results for input(s): BNP in the last 8760 hours.  ProBNP (last 3 results) No results for input(s): PROBNP in the last 8760 hours.  Radiological Exams: No results found.  Assessment/Plan Active Problems:   Acute on chronic respiratory failure with hypoxia (HCC)   Chronic atrial fibrillation (HCC)   COVID-19 virus infection   Severe sepsis (HCC)   Multifocal pneumonia   Metabolic encephalopathy   1. Acute on chronic respiratory failure hypoxia we will continue with pressure support titrate oxygen continue pulmonary toilet. 2. Chronic atrial fibrillation rate is controlled 3. COVID-19 virus infection resolved we will continue to follow 4. Severe sepsis resolved 5. Multifocal pneumonia residual damage to the lungs has been noted 6. Metabolic encephalopathy no change we will continue to monitor closely   I have personally seen and evaluated the patient, evaluated laboratory and imaging results, formulated the assessment and plan and placed orders. The Patient requires high complexity decision making with multiple systems involvement.  Rounds were done with the Respiratory Therapy Director and Staff therapists and discussed with nursing staff also.  Jinx Gilden A  Humphrey Rolls, MD [facility] Pulmonary Critical Care Medicine Sleep Medicine

## 2019-07-01 DIAGNOSIS — G9341 Metabolic encephalopathy: Secondary | ICD-10-CM | POA: Diagnosis not present

## 2019-07-01 DIAGNOSIS — J9621 Acute and chronic respiratory failure with hypoxia: Secondary | ICD-10-CM | POA: Diagnosis not present

## 2019-07-01 DIAGNOSIS — I482 Chronic atrial fibrillation, unspecified: Secondary | ICD-10-CM | POA: Diagnosis not present

## 2019-07-01 DIAGNOSIS — U071 COVID-19: Secondary | ICD-10-CM | POA: Diagnosis not present

## 2019-07-01 NOTE — Progress Notes (Signed)
Pulmonary Critical Care Medicine [facility] [place]   PULMONARY CRITICAL CARE SERVICE  PROGRESS NOTE  Date of Service: 07/01/2019  Karel Turpen  ZOX:[id number]  DOB: 1946/01/31   DOA: 06/23/2019  Referring Physician: Carron Curie, MD  HPI: Arlen Dupuis is a 74 y.o. male seen for follow up of Acute on Chronic Respiratory Failure.  Patient was attempted on T collar apparently did not tolerate it had to be placed back on the ventilator.  Now is on assist control mode currently on 40% FiO2  Medications: Reviewed on Rounds  Physical Exam:  Vitals: Temperature is 98.2 pulse 78 respiratory rate 36 blood pressure is 145/68 saturations 100%  Ventilator Settings on assist control FiO2 40% tidal volume 484 PEEP 5  . General: Comfortable at this time . Eyes: Grossly normal lids, irises & conjunctiva . ENT: grossly tongue is normal . Neck: no obvious mass . Cardiovascular: S1 S2 normal no gallop . Respiratory: No rhonchi coarse breath sounds are noted . Abdomen: soft . Skin: no rash seen on limited exam . Musculoskeletal: not rigid . Psychiatric:unable to assess . Neurologic: no seizure no involuntary movements         Lab Data:   Basic Metabolic Panel: Recent Labs  Lab 06/25/19 0749  NA 150*  K 3.5  CL 104  CO2 40*  GLUCOSE 189*  BUN 55*  CREATININE 1.39*  CALCIUM 9.4    ABG: Recent Labs  Lab 06/28/19 0312  PHART 7.384  PCO2ART 73.0*  PO2ART 77.2*  HCO3 42.8*  O2SAT 96.8    Liver Function Tests: No results for input(s): AST, ALT, ALKPHOS, BILITOT, PROT, ALBUMIN in the last 168 hours. No results for input(s): LIPASE, AMYLASE in the last 168 hours. No results for input(s): AMMONIA in the last 168 hours.  CBC: Recent Labs  Lab 06/25/19 0749 06/26/19 0753 06/28/19 1432  WBC 9.6  --  8.0  HGB 6.8* 7.7* 7.2*  HCT 23.1* 26.0* 23.9*  MCV 102.7*  --  100.8*  PLT 136*  --  105*    Cardiac Enzymes: No results for input(s): CKTOTAL, CKMB,  CKMBINDEX, TROPONINI in the last 168 hours.  BNP (last 3 results) No results for input(s): BNP in the last 8760 hours.  ProBNP (last 3 results) No results for input(s): PROBNP in the last 8760 hours.  Radiological Exams: No results found.  Assessment/Plan Active Problems:   Acute on chronic respiratory failure with hypoxia (HCC)   Chronic atrial fibrillation (HCC)   COVID-19 virus infection   Severe sepsis (HCC)   Multifocal pneumonia   Metabolic encephalopathy   1. Acute on chronic respiratory failure hypoxia we will continue with full support right now patient failed attempt at T collar earlier 2. Chronic atrial fibrillation rate is controlled we will monitor 3. COVID-19 virus infection resolved 4. Severe sepsis hemodynamics are improving 5. Multifocal pneumonia per chest x-ray results showing little improvement 6. Metabolic encephalopathy no change   I have personally seen and evaluated the patient, evaluated laboratory and imaging results, formulated the assessment and plan and placed orders. The Patient requires high complexity decision making with multiple systems involvement.  Rounds were done with the Respiratory Therapy Director and Staff therapists and discussed with nursing staff also.  Yevonne Pax, MD [facility] Pulmonary Critical Care Medicine Sleep Medicine

## 2019-07-02 DIAGNOSIS — J9621 Acute and chronic respiratory failure with hypoxia: Secondary | ICD-10-CM | POA: Diagnosis not present

## 2019-07-02 DIAGNOSIS — I482 Chronic atrial fibrillation, unspecified: Secondary | ICD-10-CM | POA: Diagnosis not present

## 2019-07-02 DIAGNOSIS — G9341 Metabolic encephalopathy: Secondary | ICD-10-CM | POA: Diagnosis not present

## 2019-07-02 DIAGNOSIS — U071 COVID-19: Secondary | ICD-10-CM | POA: Diagnosis not present

## 2019-07-02 NOTE — Progress Notes (Signed)
Pulmonary Critical Care Medicine [facility] [place]   PULMONARY CRITICAL CARE SERVICE  PROGRESS NOTE  Date of Service: 07/02/2019  Henry Mcbride  QMG:[id number]  DOB: 02-25-1945   DOA: 06/23/2019  Referring Physician: Carron Curie, MD  HPI: Henry Mcbride is a 74 y.o. male seen for follow up of Acute on Chronic Respiratory Failure.  Patient currently is on T collar was able to do about 2 hours off the ventilator right now is back on pressure support on 35% FiO2  Medications: Reviewed on Rounds  Physical Exam:  Vitals: Temperature is 96.4 pulse 98 respiratory 35 blood pressure is 130/64 saturations 100%  Ventilator Settings on pressure support FiO2 35% pressure 12 PEEP 5  . General: Comfortable at this time . Eyes: Grossly normal lids, irises & conjunctiva . ENT: grossly tongue is normal . Neck: no obvious mass . Cardiovascular: S1 S2 normal no gallop . Respiratory: No rhonchi coarse breath sounds . Abdomen: soft . Skin: no rash seen on limited exam . Musculoskeletal: not rigid . Psychiatric:unable to assess . Neurologic: no seizure no involuntary movements         Lab Data:   Basic Metabolic Panel: No results for input(s): NA, K, CL, CO2, GLUCOSE, BUN, CREATININE, CALCIUM, MG, PHOS in the last 168 hours.  ABG: Recent Labs  Lab 06/28/19 0312  PHART 7.384  PCO2ART 73.0*  PO2ART 77.2*  HCO3 42.8*  O2SAT 96.8    Liver Function Tests: No results for input(s): AST, ALT, ALKPHOS, BILITOT, PROT, ALBUMIN in the last 168 hours. No results for input(s): LIPASE, AMYLASE in the last 168 hours. No results for input(s): AMMONIA in the last 168 hours.  CBC: Recent Labs  Lab 06/26/19 0753 06/28/19 1432  WBC  --  8.0  HGB 7.7* 7.2*  HCT 26.0* 23.9*  MCV  --  100.8*  PLT  --  105*    Cardiac Enzymes: No results for input(s): CKTOTAL, CKMB, CKMBINDEX, TROPONINI in the last 168 hours.  BNP (last 3 results) No results for input(s): BNP in the last 8760  hours.  ProBNP (last 3 results) No results for input(s): PROBNP in the last 8760 hours.  Radiological Exams: No results found.  Assessment/Plan Active Problems:   Acute on chronic respiratory failure with hypoxia (HCC)   Chronic atrial fibrillation (HCC)   COVID-19 virus infection   Severe sepsis (HCC)   Multifocal pneumonia   Metabolic encephalopathy   1. Acute on chronic respiratory failure hypoxia we will continue to make attempts at weaning as above has been tolerating with several months of proceed but it is going to be a slow process 2. Chronic atrial fibrillation rate is controlled at this time we will continue to monitor 3. COVID-19 virus infection resolved 4. Severe sepsis hemodynamics are stable 5. Multifocal pneumonia treated improved 6. Metabolic encephalopathy no change   I have personally seen and evaluated the patient, evaluated laboratory and imaging results, formulated the assessment and plan and placed orders. The Patient requires high complexity decision making with multiple systems involvement.  Rounds were done with the Respiratory Therapy Director and Staff therapists and discussed with nursing staff also.  Yevonne Pax, MD [facility] Pulmonary Critical Care Medicine Sleep Medicine

## 2019-07-03 DIAGNOSIS — I482 Chronic atrial fibrillation, unspecified: Secondary | ICD-10-CM | POA: Diagnosis not present

## 2019-07-03 DIAGNOSIS — U071 COVID-19: Secondary | ICD-10-CM | POA: Diagnosis not present

## 2019-07-03 DIAGNOSIS — G9341 Metabolic encephalopathy: Secondary | ICD-10-CM | POA: Diagnosis not present

## 2019-07-03 DIAGNOSIS — J9621 Acute and chronic respiratory failure with hypoxia: Secondary | ICD-10-CM | POA: Diagnosis not present

## 2019-07-03 LAB — CBC
HCT: 24.5 % — ABNORMAL LOW (ref 39.0–52.0)
Hemoglobin: 7.3 g/dL — ABNORMAL LOW (ref 13.0–17.0)
MCH: 30.4 pg (ref 26.0–34.0)
MCHC: 29.8 g/dL — ABNORMAL LOW (ref 30.0–36.0)
MCV: 102.1 fL — ABNORMAL HIGH (ref 80.0–100.0)
Platelets: 116 10*3/uL — ABNORMAL LOW (ref 150–400)
RBC: 2.4 MIL/uL — ABNORMAL LOW (ref 4.22–5.81)
RDW: 20.3 % — ABNORMAL HIGH (ref 11.5–15.5)
WBC: 7.6 10*3/uL (ref 4.0–10.5)
nRBC: 0 % (ref 0.0–0.2)

## 2019-07-03 NOTE — Progress Notes (Signed)
Pulmonary Critical Care Medicine [facility] [place]   PULMONARY CRITICAL CARE SERVICE  PROGRESS NOTE  Date of Service: 07/03/2019  Henry Mcbride  GGE:[id number]  DOB: Mar 29, 1945   DOA: 06/23/2019  Referring Physician: Carron Curie, MD  HPI: Henry Mcbride is a 74 y.o. male seen for follow up of Acute on Chronic Respiratory Failure.  Patient currently is on full support on the ventilator was attempted on weaning however patient failed once again  Medications: Reviewed on Rounds  Physical Exam:  Vitals: Temperature is 96.7 pulse 76 respiratory rate 30 blood pressure is 116/60 saturations 98%  Ventilator Settings on assist control FiO2 40% tidal volume 450 PEEP 5  . General: Comfortable at this time . Eyes: Grossly normal lids, irises & conjunctiva . ENT: grossly tongue is normal . Neck: no obvious mass . Cardiovascular: S1 S2 normal no gallop . Respiratory: No rhonchi coarse breath sounds are noted . Abdomen: soft . Skin: no rash seen on limited exam . Musculoskeletal: not rigid . Psychiatric:unable to assess . Neurologic: no seizure no involuntary movements         Lab Data:   Basic Metabolic Panel: No results for input(s): NA, K, CL, CO2, GLUCOSE, BUN, CREATININE, CALCIUM, MG, PHOS in the last 168 hours.  ABG: Recent Labs  Lab 06/28/19 0312  PHART 7.384  PCO2ART 73.0*  PO2ART 77.2*  HCO3 42.8*  O2SAT 96.8    Liver Function Tests: No results for input(s): AST, ALT, ALKPHOS, BILITOT, PROT, ALBUMIN in the last 168 hours. No results for input(s): LIPASE, AMYLASE in the last 168 hours. No results for input(s): AMMONIA in the last 168 hours.  CBC: Recent Labs  Lab 06/28/19 1432 07/03/19 0742  WBC 8.0 7.6  HGB 7.2* 7.3*  HCT 23.9* 24.5*  MCV 100.8* 102.1*  PLT 105* 116*    Cardiac Enzymes: No results for input(s): CKTOTAL, CKMB, CKMBINDEX, TROPONINI in the last 168 hours.  BNP (last 3 results) No results for input(s): BNP in the last 8760  hours.  ProBNP (last 3 results) No results for input(s): PROBNP in the last 8760 hours.  Radiological Exams: No results found.  Assessment/Plan Active Problems:   Acute on chronic respiratory failure with hypoxia (HCC)   Chronic atrial fibrillation (HCC)   COVID-19 virus infection   Severe sepsis (HCC)   Multifocal pneumonia   Metabolic encephalopathy   1. Acute on chronic respiratory failure with hypoxia we will continue with full support on the ventilator on assist control mode currently requiring 40% FiO2.  Patient's RSB I mechanics have not been good patient has not been tolerating weaning. 2. Chronic atrial fibrillation rate is controlled we will continue to follow 3. COVID-19 virus infection treated in resolution phase 4. Severe sepsis resolved 5. Multifocal pneumonia treated we will follow along 6. Metabolic encephalopathy no change we will continue with present management   I have personally seen and evaluated the patient, evaluated laboratory and imaging results, formulated the assessment and plan and placed orders. The Patient requires high complexity decision making with multiple systems involvement.  Rounds were done with the Respiratory Therapy Director and Staff therapists and discussed with nursing staff also.  Yevonne Pax, MD [facility] Pulmonary Critical Care Medicine Sleep Medicine

## 2019-07-04 DIAGNOSIS — G9341 Metabolic encephalopathy: Secondary | ICD-10-CM | POA: Diagnosis not present

## 2019-07-04 DIAGNOSIS — I482 Chronic atrial fibrillation, unspecified: Secondary | ICD-10-CM | POA: Diagnosis not present

## 2019-07-04 DIAGNOSIS — U071 COVID-19: Secondary | ICD-10-CM | POA: Diagnosis not present

## 2019-07-04 DIAGNOSIS — J9621 Acute and chronic respiratory failure with hypoxia: Secondary | ICD-10-CM | POA: Diagnosis not present

## 2019-07-04 LAB — HEMOGLOBIN AND HEMATOCRIT, BLOOD
HCT: 22.7 % — ABNORMAL LOW (ref 39.0–52.0)
Hemoglobin: 6.9 g/dL — CL (ref 13.0–17.0)

## 2019-07-04 LAB — PREPARE RBC (CROSSMATCH)

## 2019-07-04 NOTE — Progress Notes (Addendum)
Pulmonary Critical Care Medicine [facility] [place]   PULMONARY CRITICAL CARE SERVICE  PROGRESS NOTE  Date of Service: 07/04/2019  Henry Mcbride  [id number]:[id number]  DOB: February 26, 1945   DOA: 06/23/2019  Referring Physician: Carron Curie, MD  HPI: Henry Mcbride is a 74 y.o. male seen for follow up of Acute on Chronic Respiratory Failure.  Patient was unable to wean yesterday and was found to have a hemoglobin of 6 today so is not weaning today.  Currently on full support rate of 20 with an FiO2 of 35% satting well no distress currently.  Medications: Reviewed on Rounds  Physical Exam:  Vitals: Pulse 71 respirations 30 BP 97/50 O2 sat 97% temp 98.3  Ventilator Settings ventilator mode AC VC rate of 20 tidal volume 450 PEEP of 5 and FiO2 35%  . General: Comfortable at this time . Eyes: Grossly normal lids, irises & conjunctiva . ENT: grossly tongue is normal . Neck: no obvious mass . Cardiovascular: S1 S2 normal no gallop . Respiratory: No rales or rhonchi noted . Abdomen: soft . Skin: no rash seen on limited exam . Musculoskeletal: not rigid . Psychiatric:unable to assess . Neurologic: no seizure no involuntary movements         Lab Data:   Basic Metabolic Panel: No results for input(s): NA, K, CL, CO2, GLUCOSE, BUN, CREATININE, CALCIUM, MG, PHOS in the last 168 hours.  ABG: Recent Labs  Lab 06/28/19 0312  PHART 7.384  PCO2ART 73.0*  PO2ART 77.2*  HCO3 42.8*  O2SAT 96.8    Liver Function Tests: No results for input(s): AST, ALT, ALKPHOS, BILITOT, PROT, ALBUMIN in the last 168 hours. No results for input(s): LIPASE, AMYLASE in the last 168 hours. No results for input(s): AMMONIA in the last 168 hours.  CBC: Recent Labs  Lab 06/28/19 1432 07/03/19 0742 07/04/19 0509  WBC 8.0 7.6  --   HGB 7.2* 7.3* 6.9*  HCT 23.9* 24.5* 22.7*  MCV 100.8* 102.1*  --   PLT 105* 116*  --     Cardiac Enzymes: No results for input(s): CKTOTAL, CKMB, CKMBINDEX,  TROPONINI in the last 168 hours.  BNP (last 3 results) No results for input(s): BNP in the last 8760 hours.  ProBNP (last 3 results) No results for input(s): PROBNP in the last 8760 hours.  Radiological Exams: No results found.  Assessment/Plan Active Problems:   Acute on chronic respiratory failure with hypoxia (HCC)   Chronic atrial fibrillation (HCC)   COVID-19 virus infection   Severe sepsis (HCC)   Multifocal pneumonia   Metabolic encephalopathy   1. Acute on chronic respiratory failure with hypoxia remain on full support ventilator at this time once patient is able continue to attempt weaning per protocol.  Continue aggressive pulmonary toilet supportive measures. 2. Chronic atrial fibrillation rate is controlled we will continue to follow 3. COVID-19 virus infection treated in resolution phase 4. Severe sepsis resolved 5. Multifocal pneumonia treated we will follow along 6. Metabolic encephalopathy no change we will continue with present management   I have personally seen and evaluated the patient, evaluated laboratory and imaging results, formulated the assessment and plan and placed orders. The Patient requires high complexity decision making with multiple systems involvement.  Rounds were done with the Respiratory Therapy Director and Staff therapists and discussed with nursing staff also.  Yevonne Pax, MD [facility] Pulmonary Critical Care Medicine Sleep Medicine

## 2019-07-05 DIAGNOSIS — G9341 Metabolic encephalopathy: Secondary | ICD-10-CM | POA: Diagnosis not present

## 2019-07-05 DIAGNOSIS — I482 Chronic atrial fibrillation, unspecified: Secondary | ICD-10-CM | POA: Diagnosis not present

## 2019-07-05 DIAGNOSIS — J9621 Acute and chronic respiratory failure with hypoxia: Secondary | ICD-10-CM | POA: Diagnosis not present

## 2019-07-05 DIAGNOSIS — U071 COVID-19: Secondary | ICD-10-CM | POA: Diagnosis not present

## 2019-07-05 LAB — BASIC METABOLIC PANEL
BUN: 142 mg/dL — ABNORMAL HIGH (ref 8–23)
CO2: >50 mmol/L — ABNORMAL HIGH (ref 22–32)
Calcium: 12.9 mg/dL — ABNORMAL HIGH (ref 8.9–10.3)
Chloride: 98 mmol/L (ref 98–111)
Creatinine, Ser: 1.77 mg/dL — ABNORMAL HIGH (ref 0.61–1.24)
GFR calc Af Amer: 43 mL/min — ABNORMAL LOW (ref >60)
GFR calc non Af Amer: 37 mL/min — ABNORMAL LOW (ref >60)
Glucose, Bld: 95 mg/dL (ref 70–99)
Potassium: 3 mmol/L — ABNORMAL LOW (ref 3.5–5.1)
Sodium: 155 mmol/L — ABNORMAL HIGH (ref 135–145)

## 2019-07-05 LAB — CBC
HCT: 29.2 % — ABNORMAL LOW (ref 39.0–52.0)
Hemoglobin: 9.1 g/dL — ABNORMAL LOW (ref 13.0–17.0)
MCH: 30.8 pg (ref 26.0–34.0)
MCHC: 31.2 g/dL (ref 30.0–36.0)
MCV: 99 fL (ref 80.0–100.0)
Platelets: 137 10*3/uL — ABNORMAL LOW (ref 150–400)
RBC: 2.95 MIL/uL — ABNORMAL LOW (ref 4.22–5.81)
RDW: 21.1 % — ABNORMAL HIGH (ref 11.5–15.5)
WBC: 14.1 10*3/uL — ABNORMAL HIGH (ref 4.0–10.5)
nRBC: 0.3 % — ABNORMAL HIGH (ref 0.0–0.2)

## 2019-07-05 LAB — TYPE AND SCREEN
ABO/RH(D): A POS
Antibody Screen: NEGATIVE
Unit division: 0

## 2019-07-05 LAB — BPAM RBC
Blood Product Expiration Date: 202106192359
ISSUE DATE / TIME: 202105281250
Unit Type and Rh: 6200

## 2019-07-05 NOTE — Progress Notes (Signed)
Pulmonary Critical Care Medicine [facility] [place]   PULMONARY CRITICAL CARE SERVICE  PROGRESS NOTE  Date of Service: 07/05/2019  Henry Mcbride  NGE:[id number]  DOB: August 15, 1945   DOA: 06/23/2019  Referring Physician: Carron Curie, MD  HPI: Henry Mcbride is a 74 y.o. male seen for follow up of Acute on Chronic Respiratory Failure.  Patient currently is on assist control mode has been on 35% FiO2 good saturations are noted.  Currently is on tidal volume of 450 was attempted on pressure support did not tolerate  Medications: Reviewed on Rounds  Physical Exam:  Vitals: Temperature 98.5 pulse 75 respiratory rate 23 blood pressure is 111/50 saturations 95%  Ventilator Settings on assist control FiO2 35% tidal volume 450 PEEP 5  . General: Comfortable at this time . Eyes: Grossly normal lids, irises & conjunctiva . ENT: grossly tongue is normal . Neck: no obvious mass . Cardiovascular: S1 S2 normal no gallop . Respiratory: No rhonchi no rales are noted at this time . Abdomen: soft . Skin: no rash seen on limited exam . Musculoskeletal: not rigid . Psychiatric:unable to assess . Neurologic: no seizure no involuntary movements         Lab Data:   Basic Metabolic Panel: No results for input(s): NA, K, CL, CO2, GLUCOSE, BUN, CREATININE, CALCIUM, MG, PHOS in the last 168 hours.  ABG: No results for input(s): PHART, PCO2ART, PO2ART, HCO3, O2SAT in the last 168 hours.  Liver Function Tests: No results for input(s): AST, ALT, ALKPHOS, BILITOT, PROT, ALBUMIN in the last 168 hours. No results for input(s): LIPASE, AMYLASE in the last 168 hours. No results for input(s): AMMONIA in the last 168 hours.  CBC: Recent Labs  Lab 06/28/19 1432 07/03/19 0742 07/04/19 0509 07/05/19 1016  WBC 8.0 7.6  --  14.1*  HGB 7.2* 7.3* 6.9* 9.1*  HCT 23.9* 24.5* 22.7* 29.2*  MCV 100.8* 102.1*  --  99.0  PLT 105* 116*  --  137*    Cardiac Enzymes: No results for input(s):  CKTOTAL, CKMB, CKMBINDEX, TROPONINI in the last 168 hours.  BNP (last 3 results) No results for input(s): BNP in the last 8760 hours.  ProBNP (last 3 results) No results for input(s): PROBNP in the last 8760 hours.  Radiological Exams: No results found.  Assessment/Plan Active Problems:   Acute on chronic respiratory failure with hypoxia (HCC)   Chronic atrial fibrillation (HCC)   COVID-19 virus infection   Severe sepsis (HCC)   Multifocal pneumonia   Metabolic encephalopathy   1. Acute on chronic respiratory failure hypoxia we will continue with full support on the ventilator patient is on 35% FiO2 was attempted on pressure support as already mentioned and did not tolerate it. 2. Chronic atrial fibrillation rate is controlled we will continue with supportive care 3. COVID-19 virus infection at baseline 4. Severe sepsis resolved we will continue with supportive care 5. Multifocal pneumonia treated 6. Metabolic encephalopathy no change   I have personally seen and evaluated the patient, evaluated laboratory and imaging results, formulated the assessment and plan and placed orders. The Patient requires high complexity decision making with multiple systems involvement.  Rounds were done with the Respiratory Therapy Director and Staff therapists and discussed with nursing staff also.  Yevonne Pax, MD [facility] Pulmonary Critical Care Medicine Sleep Medicine

## 2019-07-06 DIAGNOSIS — U071 COVID-19: Secondary | ICD-10-CM | POA: Diagnosis not present

## 2019-07-06 DIAGNOSIS — G9341 Metabolic encephalopathy: Secondary | ICD-10-CM | POA: Diagnosis not present

## 2019-07-06 DIAGNOSIS — I482 Chronic atrial fibrillation, unspecified: Secondary | ICD-10-CM | POA: Diagnosis not present

## 2019-07-06 DIAGNOSIS — J9621 Acute and chronic respiratory failure with hypoxia: Secondary | ICD-10-CM | POA: Diagnosis not present

## 2019-07-06 LAB — BASIC METABOLIC PANEL
Anion gap: 9 (ref 5–15)
BUN: 128 mg/dL — ABNORMAL HIGH (ref 8–23)
CO2: 49 mmol/L — ABNORMAL HIGH (ref 22–32)
Calcium: 12.9 mg/dL — ABNORMAL HIGH (ref 8.9–10.3)
Chloride: 97 mmol/L — ABNORMAL LOW (ref 98–111)
Creatinine, Ser: 1.61 mg/dL — ABNORMAL HIGH (ref 0.61–1.24)
GFR calc Af Amer: 48 mL/min — ABNORMAL LOW (ref >60)
GFR calc non Af Amer: 42 mL/min — ABNORMAL LOW (ref >60)
Glucose, Bld: 181 mg/dL — ABNORMAL HIGH (ref 70–99)
Potassium: 3.2 mmol/L — ABNORMAL LOW (ref 3.5–5.1)
Sodium: 155 mmol/L — ABNORMAL HIGH (ref 135–145)

## 2019-07-06 NOTE — Progress Notes (Signed)
Pulmonary Critical Care Medicine [facility] [place]   PULMONARY CRITICAL CARE SERVICE  PROGRESS NOTE  Date of Service: 07/06/2019  Henry Mcbride  SNK:[id number]  DOB: 12/03/1945   DOA: 06/23/2019  Referring Physician: Carron Curie, MD  HPI: Henry Mcbride is a 74 y.o. male seen for follow up of Acute on Chronic Respiratory Failure.  Patient is on full support on assist control mode was attempted on spontaneous breathing trial but failed  Medications: Reviewed on Rounds  Physical Exam:  Vitals: Temperature is 94.5 pulse 97 respiratory rate 32 blood pressure is 124/78 saturations 97%  Ventilator Settings on assist control FiO2 35% tidal volume 537 PEEP 5  . General: Comfortable at this time . Eyes: Grossly normal lids, irises & conjunctiva . ENT: grossly tongue is normal . Neck: no obvious mass . Cardiovascular: S1 S2 normal no gallop . Respiratory: No rhonchi coarse breath sounds . Abdomen: soft . Skin: no rash seen on limited exam . Musculoskeletal: not rigid . Psychiatric:unable to assess . Neurologic: no seizure no involuntary movements         Lab Data:   Basic Metabolic Panel: Recent Labs  Lab 07/05/19 1209 07/06/19 0545  NA 155* 155*  K 3.0* 3.2*  CL 98 97*  CO2 >50* 49*  GLUCOSE 95 181*  BUN 142* 128*  CREATININE 1.77* 1.61*  CALCIUM 12.9* 12.9*    ABG: No results for input(s): PHART, PCO2ART, PO2ART, HCO3, O2SAT in the last 168 hours.  Liver Function Tests: No results for input(s): AST, ALT, ALKPHOS, BILITOT, PROT, ALBUMIN in the last 168 hours. No results for input(s): LIPASE, AMYLASE in the last 168 hours. No results for input(s): AMMONIA in the last 168 hours.  CBC: Recent Labs  Lab 07/03/19 0742 07/04/19 0509 07/05/19 1016  WBC 7.6  --  14.1*  HGB 7.3* 6.9* 9.1*  HCT 24.5* 22.7* 29.2*  MCV 102.1*  --  99.0  PLT 116*  --  137*    Cardiac Enzymes: No results for input(s): CKTOTAL, CKMB, CKMBINDEX, TROPONINI in the last 168  hours.  BNP (last 3 results) No results for input(s): BNP in the last 8760 hours.  ProBNP (last 3 results) No results for input(s): PROBNP in the last 8760 hours.  Radiological Exams: No results found.  Assessment/Plan Active Problems:   Acute on chronic respiratory failure with hypoxia (HCC)   Chronic atrial fibrillation (HCC)   COVID-19 virus infection   Severe sepsis (HCC)   Multifocal pneumonia   Metabolic encephalopathy   1. Acute on chronic respiratory failure with hypoxia plan is to continue with full support on the ventilator patient was attempted at weaning but did not tolerate.  Will reassess again tomorrow 2. Chronic atrial fibrillation rate controlled 3. COVID-19 virus infection resolved 4. Severe sepsis resolved 5. Multifocal pneumonia treated we will continue with supportive care 6. Metabolic encephalopathy no change   I have personally seen and evaluated the patient, evaluated laboratory and imaging results, formulated the assessment and plan and placed orders. The Patient requires high complexity decision making with multiple systems involvement.  Rounds were done with the Respiratory Therapy Director and Staff therapists and discussed with nursing staff also.  Yevonne Pax, MD [facility] Pulmonary Critical Care Medicine Sleep Medicine

## 2019-07-07 ENCOUNTER — Other Ambulatory Visit (HOSPITAL_COMMUNITY): Payer: Medicare Other

## 2019-07-07 DIAGNOSIS — U071 COVID-19: Secondary | ICD-10-CM | POA: Diagnosis not present

## 2019-07-07 DIAGNOSIS — G9341 Metabolic encephalopathy: Secondary | ICD-10-CM | POA: Diagnosis not present

## 2019-07-07 DIAGNOSIS — J9621 Acute and chronic respiratory failure with hypoxia: Secondary | ICD-10-CM | POA: Diagnosis not present

## 2019-07-07 DIAGNOSIS — I482 Chronic atrial fibrillation, unspecified: Secondary | ICD-10-CM | POA: Diagnosis not present

## 2019-07-07 LAB — BASIC METABOLIC PANEL
BUN: 121 mg/dL — ABNORMAL HIGH (ref 8–23)
CO2: >50 mmol/L — ABNORMAL HIGH (ref 22–32)
Calcium: 12.6 mg/dL — ABNORMAL HIGH (ref 8.9–10.3)
Chloride: 93 mmol/L — ABNORMAL LOW (ref 98–111)
Creatinine, Ser: 1.67 mg/dL — ABNORMAL HIGH (ref 0.61–1.24)
GFR calc Af Amer: 46 mL/min — ABNORMAL LOW (ref >60)
GFR calc non Af Amer: 40 mL/min — ABNORMAL LOW (ref >60)
Glucose, Bld: 97 mg/dL (ref 70–99)
Potassium: 3.9 mmol/L (ref 3.5–5.1)
Sodium: 151 mmol/L — ABNORMAL HIGH (ref 135–145)

## 2019-07-07 LAB — CBC
HCT: 26.8 % — ABNORMAL LOW (ref 39.0–52.0)
Hemoglobin: 8 g/dL — ABNORMAL LOW (ref 13.0–17.0)
MCH: 30.3 pg (ref 26.0–34.0)
MCHC: 29.9 g/dL — ABNORMAL LOW (ref 30.0–36.0)
MCV: 101.5 fL — ABNORMAL HIGH (ref 80.0–100.0)
Platelets: 126 10*3/uL — ABNORMAL LOW (ref 150–400)
RBC: 2.64 MIL/uL — ABNORMAL LOW (ref 4.22–5.81)
RDW: 20.1 % — ABNORMAL HIGH (ref 11.5–15.5)
WBC: 14.3 10*3/uL — ABNORMAL HIGH (ref 4.0–10.5)
nRBC: 0.2 % (ref 0.0–0.2)

## 2019-07-07 MED ORDER — IOHEXOL 350 MG/ML SOLN
75.0000 mL | Freq: Once | INTRAVENOUS | Status: AC | PRN
  Administered 2019-07-07: 75 mL via INTRAVENOUS

## 2019-07-07 NOTE — Progress Notes (Signed)
Pulmonary Critical Care Medicine [facility] [place]   PULMONARY CRITICAL CARE SERVICE  PROGRESS NOTE  Date of Service: 07/07/2019  [name]  HBZ:[id number]  DOB: 18-May-1945   DOA: 06/23/2019  Referring Physician: Carron Curie, MD  HPI: Henry Mcbride is a 74 y.o. male seen for follow up of Acute on Chronic Respiratory Failure.  Patient currently is on assist control on 35% 2 with a PEEP of 5  Medications: Reviewed on Rounds  Physical Exam:  Vitals: Temperature is 97.3 pulse 89 respiratory rate 30 blood pressure is 10 over 65 saturations 96%  Ventilator Settings on assist control FiO2 35% tidal volume 450 PEEP 5  . General: Comfortable at this time . Eyes: Grossly normal lids, irises & conjunctiva . ENT: grossly tongue is normal . Neck: no obvious mass . Cardiovascular: S1 S2 normal no gallop . Respiratory: Scattered rhonchi coarse breath sounds . Abdomen: soft . Skin: no rash seen on limited exam . Musculoskeletal: not rigid . Psychiatric:unable to assess . Neurologic: no seizure no involuntary movements         Lab Data:   Basic Metabolic Panel: Recent Labs  Lab 07/05/19 1209 07/06/19 0545 07/07/19 0713  NA 155* 155* 151*  K 3.0* 3.2* 3.9  CL 98 97* 93*  CO2 >50* 49* >50*  GLUCOSE 95 181* 97  BUN 142* 128* 121*  CREATININE 1.77* 1.61* 1.67*  CALCIUM 12.9* 12.9* 12.6*    ABG: No results for input(s): PHART, PCO2ART, PO2ART, HCO3, O2SAT in the last 168 hours.  Liver Function Tests: No results for input(s): AST, ALT, ALKPHOS, BILITOT, PROT, ALBUMIN in the last 168 hours. No results for input(s): LIPASE, AMYLASE in the last 168 hours. No results for input(s): AMMONIA in the last 168 hours.  CBC: Recent Labs  Lab 07/03/19 0742 07/04/19 0509 07/05/19 1016 07/07/19 0713  WBC 7.6  --  14.1* 14.3*  HGB 7.3* 6.9* 9.1* 8.0*  HCT 24.5* 22.7* 29.2* 26.8*  MCV 102.1*  --  99.0 101.5*  PLT 116*  --  137* 126*    Cardiac Enzymes: No  results for input(s): CKTOTAL, CKMB, CKMBINDEX, TROPONINI in the last 168 hours.  BNP (last 3 results) No results for input(s): BNP in the last 8760 hours.  ProBNP (last 3 results) No results for input(s): PROBNP in the last 8760 hours.  Radiological Exams: DG Chest Port 1 View  Result Date: 07/07/2019 CLINICAL DATA:  Respiratory failure EXAM: PORTABLE CHEST 1 VIEW COMPARISON:  06/23/2019 chest radiograph. FINDINGS: Tracheostomy tube tip overlies the tracheal air column at the thoracic inlet. 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 Stable cardiomediastinal silhouette with mild cardiomegaly. No pneumothorax. Stable small bilateral pleural effusions. Patchy opacities throughout the lungs bilaterally, right greater than left, with a component of coarsened opacities with parenchymal distortion, with air bronchograms at the right lung base, mildly worsened. IMPRESSION: 1. Patchy coarsened bilateral lung opacities, right greater than left, with air bronchograms at the right lung base, favor pneumonia superimposed on chronic scarring, with a component pulmonary edema not excluded. 2. Stable small bilateral pleural effusions. 3. Stable mild cardiomegaly. Electronically Signed   By: Delbert Phenix M.D.   On: 07/07/2019 07:27    Assessment/Plan Active Problems:   Acute on chronic respiratory failure with hypoxia (HCC)   Chronic atrial fibrillation (HCC)   COVID-19 virus infection   Severe sepsis (HCC)   Multifocal pneumonia   Metabolic encephalopathy   1. Acute on chronic respiratory failure with hypoxia patient was attempted  at weaning again failed.  Chest x-ray reviewed patient has very severe pulmonary damage from the underlying COVID-19 infection.  Likely reason for failing weaning 2. Chronic atrial fibrillation rate controlled we will continue with supportive care 3. COVID-19 virus infection in resolution phase 4. Multifocal pneumonia still with residual changes and severe damage to  the lungs 5. Severe sepsis resolved hemodynamics are stable 6. Metabolic encephalopathy no change   I have personally seen and evaluated the patient, evaluated laboratory and imaging results, formulated the assessment and plan and placed orders. The Patient requires high complexity decision making with multiple systems involvement.  Rounds were done with the Respiratory Therapy Director and Staff therapists and discussed with nursing staff also.  Allyne Gee, MD [facility] Pulmonary Critical Care Medicine Sleep Medicine

## 2019-07-08 DIAGNOSIS — I482 Chronic atrial fibrillation, unspecified: Secondary | ICD-10-CM | POA: Diagnosis not present

## 2019-07-08 DIAGNOSIS — U071 COVID-19: Secondary | ICD-10-CM | POA: Diagnosis not present

## 2019-07-08 DIAGNOSIS — J9621 Acute and chronic respiratory failure with hypoxia: Secondary | ICD-10-CM | POA: Diagnosis not present

## 2019-07-08 DIAGNOSIS — G9341 Metabolic encephalopathy: Secondary | ICD-10-CM | POA: Diagnosis not present

## 2019-07-08 LAB — BASIC METABOLIC PANEL
Anion gap: 13 (ref 5–15)
BUN: 111 mg/dL — ABNORMAL HIGH (ref 8–23)
CO2: 43 mmol/L — ABNORMAL HIGH (ref 22–32)
Calcium: 12.1 mg/dL — ABNORMAL HIGH (ref 8.9–10.3)
Chloride: 89 mmol/L — ABNORMAL LOW (ref 98–111)
Creatinine, Ser: 1.96 mg/dL — ABNORMAL HIGH (ref 0.61–1.24)
GFR calc Af Amer: 38 mL/min — ABNORMAL LOW (ref >60)
GFR calc non Af Amer: 33 mL/min — ABNORMAL LOW (ref >60)
Glucose, Bld: 84 mg/dL (ref 70–99)
Potassium: 3.9 mmol/L (ref 3.5–5.1)
Sodium: 145 mmol/L (ref 135–145)

## 2019-07-08 LAB — SARS CORONAVIRUS 2 (TAT 6-24 HRS): SARS Coronavirus 2: NEGATIVE

## 2019-07-08 NOTE — Progress Notes (Signed)
Pulmonary Critical Care Medicine [facility] [place]   PULMONARY CRITICAL CARE SERVICE  PROGRESS NOTE  Date of Service: 07/08/2019  Henry Mcbride  ZYS:[id number]  DOB: 1945/08/20   DOA: 06/23/2019  Referring Physician: Carron Curie, MD  HPI: Henry Mcbride is a 74 y.o. male seen for follow up of Acute on Chronic Respiratory Failure.  Patient currently is on assist control and 35% FiO2 with a PEEP of 5 patient has not been tolerating weaning at this time.  Medications: Reviewed on Rounds  Physical Exam:  Vitals: Temperature is 98.2 pulse 71 respiratory 27 blood pressure is 101/54 saturations 90%  Ventilator Settings on assist control FiO2 35% tidal volume 475 with a PEEP of 5  . General: Comfortable at this time . Eyes: Grossly normal lids, irises & conjunctiva . ENT: grossly tongue is normal . Neck: no obvious mass . Cardiovascular: S1 S2 normal no gallop . Respiratory: No rhonchi no rales are noted at this time . Abdomen: soft . Skin: no rash seen on limited exam . Musculoskeletal: not rigid . Psychiatric:unable to assess . Neurologic: no seizure no involuntary movements         Lab Data:   Basic Metabolic Panel: Recent Labs  Lab 07/05/19 1209 07/06/19 0545 07/07/19 0713  NA 155* 155* 151*  K 3.0* 3.2* 3.9  CL 98 97* 93*  CO2 >50* 49* >50*  GLUCOSE 95 181* 97  BUN 142* 128* 121*  CREATININE 1.77* 1.61* 1.67*  CALCIUM 12.9* 12.9* 12.6*    ABG: No results for input(s): PHART, PCO2ART, PO2ART, HCO3, O2SAT in the last 168 hours.  Liver Function Tests: No results for input(s): AST, ALT, ALKPHOS, BILITOT, PROT, ALBUMIN in the last 168 hours. No results for input(s): LIPASE, AMYLASE in the last 168 hours. No results for input(s): AMMONIA in the last 168 hours.  CBC: Recent Labs  Lab 07/03/19 0742 07/04/19 0509 07/05/19 1016 07/07/19 0713  WBC 7.6  --  14.1* 14.3*  HGB 7.3* 6.9* 9.1* 8.0*  HCT 24.5* 22.7* 29.2* 26.8*  MCV 102.1*  --  99.0  101.5*  PLT 116*  --  137* 126*    Cardiac Enzymes: No results for input(s): CKTOTAL, CKMB, CKMBINDEX, TROPONINI in the last 168 hours.  BNP (last 3 results) No results for input(s): BNP in the last 8760 hours.  ProBNP (last 3 results) No results for input(s): PROBNP in the last 8760 hours.  Radiological Exams: CT ANGIO CHEST PE W OR WO CONTRAST  Result Date: 07/07/2019 CLINICAL DATA:  74 year old male with concern for pulmonary embolism. EXAM: CT ANGIOGRAPHY CHEST WITH CONTRAST TECHNIQUE: Multidetector CT imaging of the chest was performed using the standard protocol during bolus administration of intravenous contrast. Multiplanar CT image reconstructions and MIPs were obtained to evaluate the vascular anatomy. CONTRAST:  37mL OMNIPAQUE IOHEXOL 350 MG/ML SOLN COMPARISON:  Chest radiograph dated 07/07/2019 and CT dated 05/17/2019. FINDINGS: Evaluation is limited due to streak artifact caused by patient's arms and pacemaker. Cardiovascular: There is mild cardiomegaly. No pericardial effusion. Left pectoral pacemaker device. The thoracic aorta is unremarkable. There is no CT evidence of pulmonary embolism. Mediastinum/Nodes: Right hilar adenopathy measures 17 mm in short axis. The esophagus is grossly unremarkable. No mediastinal fluid collection. Lungs/Pleura: Large bilateral pleural effusions, new since the prior CT. There is associated partial compressive atelectasis of the lower lobes. Patchy area of consolidation with air bronchogram involving the right lung base may represent atelectasis but concerning for infiltrate. Cluster of nodular density with tree-in-bud appearance involving  the left lower lobe most consistent with pneumonia. There is diffuse interstitial and interlobular septal prominence of the upper lobes which may represent edema or pneumonia or combination. There is no pneumothorax. The central airways are patent. Upper Abdomen: Percutaneous gastrostomy with balloon in the body of the  stomach. Musculoskeletal: Diffuse subcutaneous edema. Degenerative changes of the spine. No acute osseous pathology. Review of the MIP images confirms the above findings. IMPRESSION: 1. No CT evidence of pulmonary embolism. 2. Mild cardiomegaly with findings of CHF. Large bilateral pleural effusions with associated partial compressive atelectasis of the lower lobes, new since the prior CT. 3. Cluster of nodular density with tree-in-bud appearance involving the left lower lobe most consistent with pneumonia. Clinical correlation and follow-up to resolution recommended. 4. Right hilar adenopathy, likely reactive. Electronically Signed   By: Anner Crete M.D.   On: 07/07/2019 16:18   DG Chest Port 1 View  Result Date: 07/07/2019 CLINICAL DATA:  Respiratory failure EXAM: PORTABLE CHEST 1 VIEW COMPARISON:  06/23/2019 chest radiograph. FINDINGS: Tracheostomy tube tip overlies the tracheal air column at the thoracic inlet. 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 Stable cardiomediastinal silhouette with mild cardiomegaly. No pneumothorax. Stable small bilateral pleural effusions. Patchy opacities throughout the lungs bilaterally, right greater than left, with a component of coarsened opacities with parenchymal distortion, with air bronchograms at the right lung base, mildly worsened. IMPRESSION: 1. Patchy coarsened bilateral lung opacities, right greater than left, with air bronchograms at the right lung base, favor pneumonia superimposed on chronic scarring, with a component pulmonary edema not excluded. 2. Stable small bilateral pleural effusions. 3. Stable mild cardiomegaly. Electronically Signed   By: Ilona Sorrel M.D.   On: 07/07/2019 07:27    Assessment/Plan Active Problems:   Acute on chronic respiratory failure with hypoxia (HCC)   Chronic atrial fibrillation ([place])   COVID-19 virus infection   Severe sepsis (HCC)   Multifocal pneumonia   Metabolic encephalopathy   1. Acute on chronic  respiratory failure hypoxia we will continue with full support on assist control titrate oxygen continue pulmonary toilet. 2. Chronic atrial fibrillation rate is controlled at this time we will continue with supportive care. 3. COVID-19 virus infection treated resolved 4. Severe sepsis hemodynamics are stable 5. Multifocal pneumonia treated improved 6. Metabolic encephalopathy no change we will continue present management   I have personally seen and evaluated the patient, evaluated laboratory and imaging results, formulated the assessment and plan and placed orders. The Patient requires high complexity decision making with multiple systems involvement.  Rounds were done with the Respiratory Therapy Director and Staff therapists and discussed with nursing staff also.  Allyne Gee, MD [facility] Pulmonary Critical Care Medicine Sleep Medicine

## 2019-07-09 ENCOUNTER — Other Ambulatory Visit (HOSPITAL_COMMUNITY): Payer: Medicare Other

## 2019-07-09 DIAGNOSIS — J9621 Acute and chronic respiratory failure with hypoxia: Secondary | ICD-10-CM | POA: Diagnosis not present

## 2019-07-09 DIAGNOSIS — G9341 Metabolic encephalopathy: Secondary | ICD-10-CM | POA: Diagnosis not present

## 2019-07-09 DIAGNOSIS — U071 COVID-19: Secondary | ICD-10-CM | POA: Diagnosis not present

## 2019-07-09 DIAGNOSIS — I482 Chronic atrial fibrillation, unspecified: Secondary | ICD-10-CM | POA: Diagnosis not present

## 2019-07-09 HISTORY — PX: IR THORACENTESIS ASP PLEURAL SPACE W/IMG GUIDE: IMG5380

## 2019-07-09 LAB — CULTURE, RESPIRATORY W GRAM STAIN

## 2019-07-09 MED ORDER — LIDOCAINE HCL 1 % IJ SOLN
INTRAMUSCULAR | Status: AC
  Filled 2019-07-09: qty 20

## 2019-07-09 MED ORDER — LIDOCAINE HCL 1 % IJ SOLN
INTRAMUSCULAR | Status: DC | PRN
  Administered 2019-07-09: 10 mL

## 2019-07-09 NOTE — Procedures (Signed)
PROCEDURE SUMMARY:  Successful US guided right thoracentesis. Yielded 700 mL of bloody, amber fluid. Pt tolerated procedure well. No immediate complications.  Specimen was not sent for labs. CXR ordered.  EBL < 5 mL  Hoyt Koch PA-C 07/09/2019 4:12 PM

## 2019-07-09 NOTE — Progress Notes (Addendum)
Pulmonary Critical Care Medicine [name]   PULMONARY CRITICAL CARE SERVICE  PROGRESS NOTE  Date of Service: 07/09/2019  Henry Mcbride  FIE:[id number]  DOB: 1946/01/04   DOA: 06/23/2019  Referring Physician: Merton Border, MD  HPI: Henry Mcbride is a 74 y.o. male seen for follow up of Acute on Chronic Respiratory Failure.  Patient completed weaning on pressure support for 4 hours today is now back on.  On the ventilator satting well no fever distress.  Medications: Reviewed on Rounds  Physical Exam:  Vitals: Pulse 82 respirations 46 BP 92/54 O2 sat 96% temp 99.2  Ventilator Settings ventilator mode AC VC rate of 20 tidal volume 450 PEEP of 5 and FiO2 of 35%  . General: Comfortable at this time . Eyes: Grossly normal lids, irises & conjunctiva . ENT: grossly tongue is normal . Neck: no obvious mass . Cardiovascular: S1 S2 normal no gallop . Respiratory: No rales or rhonchi noted . Abdomen: soft . Skin: no rash seen on limited exam . Musculoskeletal: not rigid . Psychiatric:unable to assess . Neurologic: no seizure no involuntary movements         Lab Data:   Basic Metabolic Panel: Recent Labs  Lab 07/05/19 1209 07/06/19 0545 07/07/19 0713 07/08/19 0711  NA 155* 155* 151* 145  K 3.0* 3.2* 3.9 3.9  CL 98 97* 93* 89*  CO2 >50* 49* >50* 43*  GLUCOSE 95 181* 97 84  BUN 142* 128* 121* 111*  CREATININE 1.77* 1.61* 1.67* 1.96*  CALCIUM 12.9* 12.9* 12.6* 12.1*    ABG: No results for input(s): PHART, PCO2ART, PO2ART, HCO3, O2SAT in the last 168 hours.  Liver Function Tests: No results for input(s): AST, ALT, ALKPHOS, BILITOT, PROT, ALBUMIN in the last 168 hours. No results for input(s): LIPASE, AMYLASE in the last 168 hours. No results for input(s): AMMONIA in the last 168 hours.  CBC: Recent Labs  Lab 07/03/19 0742 07/04/19 0509 07/05/19 1016 07/07/19 0713  WBC 7.6  --  14.1* 14.3*  HGB 7.3* 6.9* 9.1* 8.0*  HCT 24.5* 22.7* 29.2* 26.8*   MCV 102.1*  --  99.0 101.5*  PLT 116*  --  137* 126*    Cardiac Enzymes: No results for input(s): CKTOTAL, CKMB, CKMBINDEX, TROPONINI in the last 168 hours.  BNP (last 3 results) No results for input(s): BNP in the last 8760 hours.  ProBNP (last 3 results) No results for input(s): PROBNP in the last 8760 hours.  Radiological Exams: DG Chest Port 1 View  Result Date: 07/09/2019 CLINICAL DATA:  74 year old male status post thoracentesis. EXAM: PORTABLE CHEST 1 VIEW COMPARISON:  Chest radiograph and CT dated 07/07/2019. FINDINGS: Interval decrease in the size of the right pleural effusion with improved aeration of the right lung compared to the prior radiograph. Small left pleural effusion is noted. No obvious pneumothorax. Stable cardiomediastinal silhouette. Tracheostomy above the carina. Left pectoral pacemaker device. No acute osseous pathology. IMPRESSION: Interval decrease in the size of the right pleural effusion with improved aeration of the right lung status post thoracentesis. No pneumothorax Electronically Signed   By: Anner Crete M.D.   On: 07/09/2019 17:09   IR THORACENTESIS ASP PLEURAL SPACE W/IMG GUIDE  Result Date: 07/09/2019 INDICATION: Patient with history of COVID-19 infection, multifocal pneumonia, sepsis, now with bilateral pleural effusions. Request is made for therapeutic thoracentesis. EXAM: ULTRASOUND GUIDED THERAPEUTIC RIGHT THORACENTESIS MEDICATIONS: 10 mL 1% lidocaine COMPLICATIONS: None immediate. PROCEDURE: An ultrasound guided thoracentesis was thoroughly discussed with the patient and questions  answered. The benefits, risks, alternatives and complications were also discussed. The patient understands and wishes to proceed with the procedure. Written consent was obtained. Ultrasound was performed to localize and mark an adequate pocket of fluid in the right chest. The area was then prepped and draped in the normal sterile fashion. 1% Lidocaine was used for local  anesthesia. Under ultrasound guidance a 6 Fr Safe-T-Centesis catheter was introduced. Thoracentesis was performed. The catheter was removed and a dressing applied. FINDINGS: A total of approximately 700 mL of bloody, amber fluid was removed. IMPRESSION: Successful ultrasound guided therapeutic right thoracentesis yielding 700 mL of pleural fluid. Read by: Loyce Dys PA-C Electronically Signed   By: Corlis Leak M.D.   On: 07/09/2019 16:15    Assessment/Plan Active Problems:   Acute on chronic respiratory failure with hypoxia (HCC)   Chronic atrial fibrillation (HCC)   COVID-19 virus infection   Severe sepsis (HCC)   Multifocal pneumonia   Metabolic encephalopathy   1. Acute on chronic respiratory failure hypoxia continue to attempt weaning patient pleaded 4 hours today rest on full vent settings overnight continue supportive measures and pulmonary toilet. 2. Chronic atrial fibrillation rate is controlled at this time we will continue with supportive care. 3. COVID-19 virus infection treated resolved 4. Severe sepsis hemodynamics are stable 5. Multifocal pneumonia treated improved 6. Metabolic encephalopathy no change we will continue present management   I have personally seen and evaluated the patient, evaluated laboratory and imaging results, formulated the assessment and plan and placed orders. The Patient requires high complexity decision making with multiple systems involvement.  Rounds were done with the Respiratory Therapy Director and Staff therapists and discussed with nursing staff also.  Yevonne Pax, MD [facility] Pulmonary Critical Care Medicine Sleep Medicine

## 2019-07-10 DIAGNOSIS — U071 COVID-19: Secondary | ICD-10-CM | POA: Diagnosis not present

## 2019-07-10 DIAGNOSIS — G9341 Metabolic encephalopathy: Secondary | ICD-10-CM | POA: Diagnosis not present

## 2019-07-10 DIAGNOSIS — J9621 Acute and chronic respiratory failure with hypoxia: Secondary | ICD-10-CM | POA: Diagnosis not present

## 2019-07-10 DIAGNOSIS — I482 Chronic atrial fibrillation, unspecified: Secondary | ICD-10-CM | POA: Diagnosis not present

## 2019-07-10 LAB — BASIC METABOLIC PANEL
Anion gap: 9 (ref 5–15)
BUN: 106 mg/dL — ABNORMAL HIGH (ref 8–23)
CO2: 41 mmol/L — ABNORMAL HIGH (ref 22–32)
Calcium: 11.1 mg/dL — ABNORMAL HIGH (ref 8.9–10.3)
Chloride: 97 mmol/L — ABNORMAL LOW (ref 98–111)
Creatinine, Ser: 2.22 mg/dL — ABNORMAL HIGH (ref 0.61–1.24)
GFR calc Af Amer: 33 mL/min — ABNORMAL LOW (ref >60)
GFR calc non Af Amer: 28 mL/min — ABNORMAL LOW (ref >60)
Glucose, Bld: 44 mg/dL — CL (ref 70–99)
Potassium: 4.3 mmol/L (ref 3.5–5.1)
Sodium: 147 mmol/L — ABNORMAL HIGH (ref 135–145)

## 2019-07-10 NOTE — Progress Notes (Signed)
Pulmonary Critical Care Medicine [facility] [place]   PULMONARY CRITICAL CARE SERVICE  PROGRESS NOTE  Date of Service: 07/10/2019  Henry Mcbride  DXI:[id number]  DOB: Apr 12, 1945   DOA: 06/23/2019  Referring Physician: Carron Curie, MD  HPI: Henry Mcbride is a 74 y.o. male seen for follow up of Acute on Chronic Respiratory Failure.  Patient currently is on T collar has been on 35% FiO2 the goal today is for 8 hours yesterday was able to complete 4 hours  Medications: Reviewed on Rounds  Physical Exam:  Vitals: Temperature 96.4 pulse 70 respiratory 24 blood pressure is 88/48 saturations 100%  Ventilator Settings on T collar FiO2 35%  . General: Comfortable at this time . Eyes: Grossly normal lids, irises & conjunctiva . ENT: grossly tongue is normal . Neck: no obvious mass . Cardiovascular: S1 S2 normal no gallop . Respiratory: No rhonchi no rales are noted at this time . Abdomen: soft . Skin: no rash seen on limited exam . Musculoskeletal: not rigid . Psychiatric:unable to assess . Neurologic: no seizure no involuntary movements         Lab Data:   Basic Metabolic Panel: Recent Labs  Lab 07/05/19 1209 07/06/19 0545 07/07/19 0713 07/08/19 0711 07/10/19 0431  NA 155* 155* 151* 145 147*  K 3.0* 3.2* 3.9 3.9 4.3  CL 98 97* 93* 89* 97*  CO2 >50* 49* >50* 43* 41*  GLUCOSE 95 181* 97 84 44*  BUN 142* 128* 121* 111* 106*  CREATININE 1.77* 1.61* 1.67* 1.96* 2.22*  CALCIUM 12.9* 12.9* 12.6* 12.1* 11.1*    ABG: No results for input(s): PHART, PCO2ART, PO2ART, HCO3, O2SAT in the last 168 hours.  Liver Function Tests: No results for input(s): AST, ALT, ALKPHOS, BILITOT, PROT, ALBUMIN in the last 168 hours. No results for input(s): LIPASE, AMYLASE in the last 168 hours. No results for input(s): AMMONIA in the last 168 hours.  CBC: Recent Labs  Lab 07/04/19 0509 07/05/19 1016 07/07/19 0713  WBC  --  14.1* 14.3*  HGB 6.9* 9.1* 8.0*  HCT 22.7* 29.2*  26.8*  MCV  --  99.0 101.5*  PLT  --  137* 126*    Cardiac Enzymes: No results for input(s): CKTOTAL, CKMB, CKMBINDEX, TROPONINI in the last 168 hours.  BNP (last 3 results) No results for input(s): BNP in the last 8760 hours.  ProBNP (last 3 results) No results for input(s): PROBNP in the last 8760 hours.  Radiological Exams: DG Chest Port 1 View  Result Date: 07/09/2019 CLINICAL DATA:  74 year old male status post thoracentesis. EXAM: PORTABLE CHEST 1 VIEW COMPARISON:  Chest radiograph and CT dated 07/07/2019. FINDINGS: Interval decrease in the size of the right pleural effusion with improved aeration of the right lung compared to the prior radiograph. Small left pleural effusion is noted. No obvious pneumothorax. Stable cardiomediastinal silhouette. Tracheostomy above the carina. Left pectoral pacemaker device. No acute osseous pathology. IMPRESSION: Interval decrease in the size of the right pleural effusion with improved aeration of the right lung status post thoracentesis. No pneumothorax Electronically Signed   By: Elgie Collard M.D.   On: 07/09/2019 17:09   IR THORACENTESIS ASP PLEURAL SPACE W/IMG GUIDE  Result Date: 07/09/2019 INDICATION: Patient with history of COVID-19 infection, multifocal pneumonia, sepsis, now with bilateral pleural effusions. Request is made for therapeutic thoracentesis. EXAM: ULTRASOUND GUIDED THERAPEUTIC RIGHT THORACENTESIS MEDICATIONS: 10 mL 1% lidocaine COMPLICATIONS: None immediate. PROCEDURE: An ultrasound guided thoracentesis was thoroughly discussed with the patient and questions answered. The  benefits, risks, alternatives and complications were also discussed. The patient understands and wishes to proceed with the procedure. Written consent was obtained. Ultrasound was performed to localize and mark an adequate pocket of fluid in the right chest. The area was then prepped and draped in the normal sterile fashion. 1% Lidocaine was used for local  anesthesia. Under ultrasound guidance a 6 Fr Safe-T-Centesis catheter was introduced. Thoracentesis was performed. The catheter was removed and a dressing applied. FINDINGS: A total of approximately 700 mL of bloody, amber fluid was removed. IMPRESSION: Successful ultrasound guided therapeutic right thoracentesis yielding 700 mL of pleural fluid. Read by: Brynda Greathouse PA-C Electronically Signed   By: Lucrezia Europe M.D.   On: 07/09/2019 16:15    Assessment/Plan Active Problems:   Acute on chronic respiratory failure with hypoxia (HCC)   Chronic atrial fibrillation ([place])   COVID-19 virus infection   Severe sepsis (HCC)   Multifocal pneumonia   Metabolic encephalopathy   1. Acute on chronic respiratory failure hypoxia plan is to continue with T collar on 35% FiO2 goal of 8 hours. 2. Chronic atrial fibrillation rate controlled 3. COVID-19 virus infection treated resolved 4. Severe sepsis resolved hemodynamics are stable 5. Multifocal pneumonia patient has residual changes secondary to the acute infection we will continue to follow 6. Metabolic encephalopathy no change   I have personally seen and evaluated the patient, evaluated laboratory and imaging results, formulated the assessment and plan and placed orders. The Patient requires high complexity decision making with multiple systems involvement.  Rounds were done with the Respiratory Therapy Director and Staff therapists and discussed with nursing staff also.  Allyne Gee, MD [facility] Pulmonary Critical Care Medicine Sleep Medicine

## 2019-07-11 DIAGNOSIS — G9341 Metabolic encephalopathy: Secondary | ICD-10-CM | POA: Diagnosis not present

## 2019-07-11 DIAGNOSIS — J9621 Acute and chronic respiratory failure with hypoxia: Secondary | ICD-10-CM | POA: Diagnosis not present

## 2019-07-11 DIAGNOSIS — U071 COVID-19: Secondary | ICD-10-CM | POA: Diagnosis not present

## 2019-07-11 DIAGNOSIS — I482 Chronic atrial fibrillation, unspecified: Secondary | ICD-10-CM | POA: Diagnosis not present

## 2019-07-11 NOTE — Progress Notes (Addendum)
Pulmonary Critical Care Medicine [facility] [place]   PULMONARY CRITICAL CARE SERVICE  PROGRESS NOTE  Date of Service: 07/11/2019  Sakib Noguez  BDZ:[id number]  DOB: [birth year]   DOA: 06/23/2019  Referring Physician: Carron Curie, MD  HPI: Nezar Buckles is a 74 y.o. male seen for follow up of Acute on Chronic Respiratory Failure. Patient remains on ATC 28% of a 12-16 hour goal.   Medications: Reviewed on Rounds  Physical Exam:  Vitals: Pulse 76, resp 27, bp 121/62, o2 sat 100%, temp 97.2.    Ventilator Settings ATC 28%  . General: Comfortable at this time . Eyes: Grossly normal lids, irises & conjunctiva . ENT: grossly tongue is normal . Neck: no obvious mass . Cardiovascular: S1 S2 normal no gallop . Respiratory: clear bilaterally . Abdomen: soft . Skin: no rash seen on limited exam . Musculoskeletal: not rigid . Psychiatric:unable to assess . Neurologic: no seizure no involuntary movements         Lab Data:   Basic Metabolic Panel: Recent Labs  Lab 07/05/19 1209 07/06/19 0545 07/07/19 0713 07/08/19 0711 07/10/19 0431  NA 155* 155* 151* 145 147*  K 3.0* 3.2* 3.9 3.9 4.3  CL 98 97* 93* 89* 97*  CO2 >50* 49* >50* 43* 41*  GLUCOSE 95 181* 97 84 44*  BUN 142* 128* 121* 111* 106*  CREATININE 1.77* 1.61* 1.67* 1.96* 2.22*  CALCIUM 12.9* 12.9* 12.6* 12.1* 11.1*    ABG: No results for input(s): PHART, PCO2ART, PO2ART, HCO3, O2SAT in the last 168 hours.  Liver Function Tests: No results for input(s): AST, ALT, ALKPHOS, BILITOT, PROT, ALBUMIN in the last 168 hours. No results for input(s): LIPASE, AMYLASE in the last 168 hours. No results for input(s): AMMONIA in the last 168 hours.  CBC: Recent Labs  Lab 07/05/19 1016 07/07/19 0713  WBC 14.1* 14.3*  HGB 9.1* 8.0*  HCT 29.2* 26.8*  MCV 99.0 101.5*  PLT 137* 126*    Cardiac Enzymes: No results for input(s): CKTOTAL, CKMB, CKMBINDEX, TROPONINI in the last 168 hours.  BNP (last 3  results) No results for input(s): BNP in the last 8760 hours.  ProBNP (last 3 results) No results for input(s): PROBNP in the last 8760 hours.  Radiological Exams: No results found.  Assessment/Plan Active Problems:   Acute on chronic respiratory failure with hypoxia (HCC)   Chronic atrial fibrillation (HCC)   COVID-19 virus infection   Severe sepsis (HCC)   Multifocal pneumonia   Metabolic encephalopathy   1. Acute on chronic respiratory failure hypoxia continue with 28% ATC. Continue aggressive pulmonary toilet and supportive measures.  2. Chronic atrial fibrillation rate controlled 3. COVID-19 virus infection treated resolved 4. Severe sepsis resolved hemodynamics are stable 5. Multifocal pneumonia patient has residual changes secondary to the acute infection we will continue to follow 6. Metabolic encephalopathy no change   I have personally seen and evaluated the patient, evaluated laboratory and imaging results, formulated the assessment and plan and placed orders. The Patient requires high complexity decision making with multiple systems involvement.  Rounds were done with the Respiratory Therapy Director and Staff therapists and discussed with nursing staff also.  Yevonne Pax, MD [facility] Pulmonary Critical Care Medicine Sleep Medicine

## 2019-07-12 DIAGNOSIS — G9341 Metabolic encephalopathy: Secondary | ICD-10-CM | POA: Diagnosis not present

## 2019-07-12 DIAGNOSIS — J9621 Acute and chronic respiratory failure with hypoxia: Secondary | ICD-10-CM | POA: Diagnosis not present

## 2019-07-12 DIAGNOSIS — U071 COVID-19: Secondary | ICD-10-CM | POA: Diagnosis not present

## 2019-07-12 DIAGNOSIS — I482 Chronic atrial fibrillation, unspecified: Secondary | ICD-10-CM | POA: Diagnosis not present

## 2019-07-12 LAB — BASIC METABOLIC PANEL
Anion gap: 9 (ref 5–15)
BUN: 84 mg/dL — ABNORMAL HIGH (ref 8–23)
CO2: 35 mmol/L — ABNORMAL HIGH (ref 22–32)
Calcium: 10.3 mg/dL (ref 8.9–10.3)
Chloride: 99 mmol/L (ref 98–111)
Creatinine, Ser: 2.2 mg/dL — ABNORMAL HIGH (ref 0.61–1.24)
GFR calc Af Amer: 33 mL/min — ABNORMAL LOW (ref >60)
GFR calc non Af Amer: 28 mL/min — ABNORMAL LOW (ref >60)
Glucose, Bld: 80 mg/dL (ref 70–99)
Potassium: 4.7 mmol/L (ref 3.5–5.1)
Sodium: 143 mmol/L (ref 135–145)

## 2019-07-12 NOTE — Progress Notes (Signed)
Pulmonary Critical Care Medicine [facility] [place]   PULMONARY CRITICAL CARE SERVICE  PROGRESS NOTE  Date of Service: 07/12/2019  Obdulio Mash  VPX:[id number]  DOB: [birth year]   DOA: 06/23/2019  Referring Physician: Carron Curie, MD  HPI: Eaton Folmar is a 74 y.o. male seen for follow up of Acute on Chronic Respiratory Failure.  Patient has been failing attempts at weaning remains on assist control mode  Medications: Reviewed on Rounds  Physical Exam:  Vitals: Temperature 97.6 pulse 81 respiratory 29 blood pressure is one 1/60 saturations 97%  Ventilator Settings on assist control FiO2 35% PEEP 5 tidal volume 450  . General: Comfortable at this time . Eyes: Grossly normal lids, irises & conjunctiva . ENT: grossly tongue is normal . Neck: no obvious mass . Cardiovascular: S1 S2 normal no gallop . Respiratory: No rhonchi coarse breath sounds . Abdomen: soft . Skin: no rash seen on limited exam . Musculoskeletal: not rigid . Psychiatric:unable to assess . Neurologic: no seizure no involuntary movements         Lab Data:   Basic Metabolic Panel: Recent Labs  Lab 07/06/19 0545 07/07/19 0713 07/08/19 0711 07/10/19 0431 07/12/19 0743  NA 155* 151* 145 147* 143  K 3.2* 3.9 3.9 4.3 4.7  CL 97* 93* 89* 97* 99  CO2 49* >50* 43* 41* 35*  GLUCOSE 181* 97 84 44* 80  BUN 128* 121* 111* 106* 84*  CREATININE 1.61* 1.67* 1.96* 2.22* 2.20*  CALCIUM 12.9* 12.6* 12.1* 11.1* 10.3    ABG: No results for input(s): PHART, PCO2ART, PO2ART, HCO3, O2SAT in the last 168 hours.  Liver Function Tests: No results for input(s): AST, ALT, ALKPHOS, BILITOT, PROT, ALBUMIN in the last 168 hours. No results for input(s): LIPASE, AMYLASE in the last 168 hours. No results for input(s): AMMONIA in the last 168 hours.  CBC: Recent Labs  Lab 07/07/19 0713  WBC 14.3*  HGB 8.0*  HCT 26.8*  MCV 101.5*  PLT 126*    Cardiac Enzymes: No results for input(s): CKTOTAL, CKMB,  CKMBINDEX, TROPONINI in the last 168 hours.  BNP (last 3 results) No results for input(s): BNP in the last 8760 hours.  ProBNP (last 3 results) No results for input(s): PROBNP in the last 8760 hours.  Radiological Exams: No results found.  Assessment/Plan Active Problems:   Acute on chronic respiratory failure with hypoxia (HCC)   Chronic atrial fibrillation (HCC)   COVID-19 virus infection   Severe sepsis (HCC)   Multifocal pneumonia   Metabolic encephalopathy   1. Acute on chronic respiratory failure hypoxia we will continue with the weaning attempts right now is not tolerating.  Continue secretion management supportive care. 2. Severe sepsis resolved hemodynamics are stable 3. Multifocal pneumonia treated improving 4. Metabolic encephalopathy no change we will continue with supportive care 5. Chronic atrial fibrillation rate is controlled 6. COVID-19 virus infection resolved   I have personally seen and evaluated the patient, evaluated laboratory and imaging results, formulated the assessment and plan and placed orders. The Patient requires high complexity decision making with multiple systems involvement.  Rounds were done with the Respiratory Therapy Director and Staff therapists and discussed with nursing staff also.  Yevonne Pax, MD [facility] Pulmonary Critical Care Medicine Sleep Medicine

## 2019-07-13 DIAGNOSIS — I482 Chronic atrial fibrillation, unspecified: Secondary | ICD-10-CM | POA: Diagnosis not present

## 2019-07-13 DIAGNOSIS — J9621 Acute and chronic respiratory failure with hypoxia: Secondary | ICD-10-CM | POA: Diagnosis not present

## 2019-07-13 DIAGNOSIS — G9341 Metabolic encephalopathy: Secondary | ICD-10-CM | POA: Diagnosis not present

## 2019-07-13 DIAGNOSIS — U071 COVID-19: Secondary | ICD-10-CM | POA: Diagnosis not present

## 2019-07-13 LAB — VANCOMYCIN, TROUGH: Vancomycin Tr: 26 ug/mL (ref 15–20)

## 2019-07-13 NOTE — Progress Notes (Signed)
Pulmonary Critical Care Medicine [facility] [place]   PULMONARY CRITICAL CARE SERVICE  PROGRESS NOTE  Date of Service: 07/13/2019  Henry Mcbride  [id number]:[id number]  DOB: Jul 27, 1945   DOA: 06/23/2019  Referring Physician: Carron Curie, MD  HPI: Henry Mcbride is a 74 y.o. male seen for follow up of Acute on Chronic Respiratory Failure.  Patient currently is on assist control has been on 35% FiO2 with a PEEP of 5 good volumes are noted.  Medications: Reviewed on Rounds  Physical Exam:  Vitals: Temperature is 95.1 pulse 76 respiratory 32 blood pressure is 106/65 saturations 90%  Ventilator Settings on assist control FiO2 35% tidal volume 630 PEEP 5  . General: Comfortable at this time . Eyes: Grossly normal lids, irises & conjunctiva . ENT: grossly tongue is normal . Neck: no obvious mass . Cardiovascular: S1 S2 normal no gallop . Respiratory: No rhonchi coarse breath sounds . Abdomen: soft . Skin: no rash seen on limited exam . Musculoskeletal: not rigid . Psychiatric:unable to assess . Neurologic: no seizure no involuntary movements         Lab Data:   Basic Metabolic Panel: Recent Labs  Lab 07/07/19 0713 07/08/19 0711 07/10/19 0431 07/12/19 0743  NA 151* 145 147* 143  K 3.9 3.9 4.3 4.7  CL 93* 89* 97* 99  CO2 >50* 43* 41* 35*  GLUCOSE 97 84 44* 80  BUN 121* 111* 106* 84*  CREATININE 1.67* 1.96* 2.22* 2.20*  CALCIUM 12.6* 12.1* 11.1* 10.3    ABG: No results for input(s): PHART, PCO2ART, PO2ART, HCO3, O2SAT in the last 168 hours.  Liver Function Tests: No results for input(s): AST, ALT, ALKPHOS, BILITOT, PROT, ALBUMIN in the last 168 hours. No results for input(s): LIPASE, AMYLASE in the last 168 hours. No results for input(s): AMMONIA in the last 168 hours.  CBC: Recent Labs  Lab 07/07/19 0713  WBC 14.3*  HGB 8.0*  HCT 26.8*  MCV 101.5*  PLT 126*    Cardiac Enzymes: No results for input(s): CKTOTAL, CKMB, CKMBINDEX, TROPONINI in the  last 168 hours.  BNP (last 3 results) No results for input(s): BNP in the last 8760 hours.  ProBNP (last 3 results) No results for input(s): PROBNP in the last 8760 hours.  Radiological Exams: No results found.  Assessment/Plan Active Problems:   Acute on chronic respiratory failure with hypoxia (HCC)   Chronic atrial fibrillation (HCC)   COVID-19 virus infection   Severe sepsis (HCC)   Multifocal pneumonia   Metabolic encephalopathy   1. Acute on chronic respiratory failure hypoxia we will continue with full support on the ventilator patient will be attempted on pressure support wean again 2. Severe sepsis resolved 3. Multifocal pneumonia treated improving 4. Metabolic encephalopathy no change supportive care 5. Chronic atrial fibrillation rate controlled 6. COVID-19 virus infection resolved   I have personally seen and evaluated the patient, evaluated laboratory and imaging results, formulated the assessment and plan and placed orders. The Patient requires high complexity decision making with multiple systems involvement.  Rounds were done with the Respiratory Therapy Director and Staff therapists and discussed with nursing staff also.  Yevonne Pax, MD [facility] Pulmonary Critical Care Medicine Sleep Medicine

## 2019-07-14 DIAGNOSIS — I482 Chronic atrial fibrillation, unspecified: Secondary | ICD-10-CM | POA: Diagnosis not present

## 2019-07-14 DIAGNOSIS — J9621 Acute and chronic respiratory failure with hypoxia: Secondary | ICD-10-CM | POA: Diagnosis not present

## 2019-07-14 DIAGNOSIS — U071 COVID-19: Secondary | ICD-10-CM | POA: Diagnosis not present

## 2019-07-14 DIAGNOSIS — G9341 Metabolic encephalopathy: Secondary | ICD-10-CM | POA: Diagnosis not present

## 2019-07-14 LAB — VANCOMYCIN, TROUGH: Vancomycin Tr: 23 ug/mL (ref 15–20)

## 2019-07-14 LAB — C DIFFICILE (CDIFF) QUICK SCRN (NO PCR REFLEX)
C Diff antigen: NEGATIVE
C Diff interpretation: NOT DETECTED
C Diff toxin: NEGATIVE

## 2019-07-14 NOTE — Progress Notes (Signed)
Pulmonary Critical Care Medicine [facility] [place]   PULMONARY CRITICAL CARE SERVICE  PROGRESS NOTE  Date of Service: 07/14/2019  Henry Mcbride  [id number]:[id number]  DOB: [birth year]   DOA: 06/23/2019  Referring Physician: Carron Curie, MD  HPI: Henry Mcbride is a 74 y.o. male seen for follow up of Acute on Chronic Respiratory Failure.  Patient currently is on pressure support on 35% FiO2 has been on pressure support 12/5 was able to do about 4 hours on T collar  Medications: Reviewed on Rounds  Physical Exam:  Vitals: Temperature 98.0 pulse 78 respiratory rate 22 blood pressure is 103/60 saturations 100%  Ventilator Settings on pressure support FiO2 is 35% tidal volume 507 pressure poor 12 PEEP 5  . General: Comfortable at this time . Eyes: Grossly normal lids, irises & conjunctiva . ENT: grossly tongue is normal . Neck: no obvious mass . Cardiovascular: S1 S2 normal no gallop . Respiratory: No rhonchi coarse breath sounds are noted . Abdomen: soft . Skin: no rash seen on limited exam . Musculoskeletal: not rigid . Psychiatric:unable to assess . Neurologic: no seizure no involuntary movements         Lab Data:   Basic Metabolic Panel: Recent Labs  Lab 07/08/19 0711 07/10/19 0431 07/12/19 0743  NA 145 147* 143  K 3.9 4.3 4.7  CL 89* 97* 99  CO2 43* 41* 35*  GLUCOSE 84 44* 80  BUN 111* 106* 84*  CREATININE 1.96* 2.22* 2.20*  CALCIUM 12.1* 11.1* 10.3    ABG: No results for input(s): PHART, PCO2ART, PO2ART, HCO3, O2SAT in the last 168 hours.  Liver Function Tests: No results for input(s): AST, ALT, ALKPHOS, BILITOT, PROT, ALBUMIN in the last 168 hours. No results for input(s): LIPASE, AMYLASE in the last 168 hours. No results for input(s): AMMONIA in the last 168 hours.  CBC: No results for input(s): WBC, NEUTROABS, HGB, HCT, MCV, PLT in the last 168 hours.  Cardiac Enzymes: No results for input(s): CKTOTAL, CKMB, CKMBINDEX, TROPONINI in the last  168 hours.  BNP (last 3 results) No results for input(s): BNP in the last 8760 hours.  ProBNP (last 3 results) No results for input(s): PROBNP in the last 8760 hours.  Radiological Exams: No results found.  Assessment/Plan Active Problems:   Acute on chronic respiratory failure with hypoxia (HCC)   Chronic atrial fibrillation (HCC)   COVID-19 virus infection   Severe sepsis (HCC)   Multifocal pneumonia   Metabolic encephalopathy   1. Acute on chronic respiratory failure hypoxia we will continue to wean on T collar as tolerated. 2. Chronic atrial fibrillation rate controlled 3. COVID-19 virus infection resolved 4. Severe sepsis resolved hemodynamics are stable 5. Multifocal pneumonia treated we will continue present management 6. Metabolic encephalopathy no change   I have personally seen and evaluated the patient, evaluated laboratory and imaging results, formulated the assessment and plan and placed orders. The Patient requires high complexity decision making with multiple systems involvement.  Rounds were done with the Respiratory Therapy Director and Staff therapists and discussed with nursing staff also.  Yevonne Pax, MD [facility] Pulmonary Critical Care Medicine Sleep Medicine

## 2019-07-15 DIAGNOSIS — U071 COVID-19: Secondary | ICD-10-CM | POA: Diagnosis not present

## 2019-07-15 DIAGNOSIS — I482 Chronic atrial fibrillation, unspecified: Secondary | ICD-10-CM | POA: Diagnosis not present

## 2019-07-15 DIAGNOSIS — J9621 Acute and chronic respiratory failure with hypoxia: Secondary | ICD-10-CM | POA: Diagnosis not present

## 2019-07-15 DIAGNOSIS — G9341 Metabolic encephalopathy: Secondary | ICD-10-CM | POA: Diagnosis not present

## 2019-07-15 LAB — BASIC METABOLIC PANEL
Anion gap: 12 (ref 5–15)
BUN: 70 mg/dL — ABNORMAL HIGH (ref 8–23)
CO2: 29 mmol/L (ref 22–32)
Calcium: 9 mg/dL (ref 8.9–10.3)
Chloride: 106 mmol/L (ref 98–111)
Creatinine, Ser: 1.86 mg/dL — ABNORMAL HIGH (ref 0.61–1.24)
GFR calc Af Amer: 40 mL/min — ABNORMAL LOW (ref >60)
GFR calc non Af Amer: 35 mL/min — ABNORMAL LOW (ref >60)
Glucose, Bld: 90 mg/dL (ref 70–99)
Potassium: 5 mmol/L (ref 3.5–5.1)
Sodium: 147 mmol/L — ABNORMAL HIGH (ref 135–145)

## 2019-07-15 LAB — CBC
HCT: 26.7 % — ABNORMAL LOW (ref 39.0–52.0)
Hemoglobin: 8 g/dL — ABNORMAL LOW (ref 13.0–17.0)
MCH: 30.9 pg (ref 26.0–34.0)
MCHC: 30 g/dL (ref 30.0–36.0)
MCV: 103.1 fL — ABNORMAL HIGH (ref 80.0–100.0)
Platelets: 318 10*3/uL (ref 150–400)
RBC: 2.59 MIL/uL — ABNORMAL LOW (ref 4.22–5.81)
RDW: 20.1 % — ABNORMAL HIGH (ref 11.5–15.5)
WBC: 11.2 10*3/uL — ABNORMAL HIGH (ref 4.0–10.5)
nRBC: 0 % (ref 0.0–0.2)

## 2019-07-15 LAB — VANCOMYCIN, TROUGH: Vancomycin Tr: 18 ug/mL (ref 15–20)

## 2019-07-15 NOTE — Progress Notes (Signed)
Pulmonary Critical Care Medicine [facility] [place]   PULMONARY CRITICAL CARE SERVICE  PROGRESS NOTE  Date of Service: 07/15/2019  Henry Mcbride  [id number]  DOB: [birth year]   DOA: 06/23/2019  Referring Physician: Carron Curie, MD  HPI: Henry Mcbride is a 74 y.o. male seen for follow up of Acute on Chronic Respiratory Failure.  Patient is on pressure support at about 1 hour goal currently is requiring 35% FiO2  Medications: Reviewed on Rounds  Physical Exam:  Vitals: Temperature is 98.2 pulse 85 respiratory 30 blood pressure is 115/65 saturations 100%  Ventilator Settings on pressure support FiO2 35% pressure poor 12 PEEP 5   General: Comfortable at this time  Eyes: Grossly normal lids, irises & conjunctiva  ENT: grossly tongue is normal  Neck: no obvious mass  Cardiovascular: S1 S2 normal no gallop  Respiratory: No rhonchi coarse breath sounds are noted  Abdomen: soft  Skin: no rash seen on limited exam  Musculoskeletal: not rigid  Psychiatric:unable to assess  Neurologic: no seizure no involuntary movements         Lab Data:   Basic Metabolic Panel: Recent Labs  Lab 07/10/19 0431 07/12/19 0743 07/15/19 0542  NA 147* 143 147*  K 4.3 4.7 5.0  CL 97* 99 106  CO2 41* 35* 29  GLUCOSE 44* 80 90  BUN 106* 84* 70*  CREATININE 2.22* 2.20* 1.86*  CALCIUM 11.1* 10.3 9.0    ABG: No results for input(s): PHART, PCO2ART, PO2ART, HCO3, O2SAT in the last 168 hours.  Liver Function Tests: No results for input(s): AST, ALT, ALKPHOS, BILITOT, PROT, ALBUMIN in the last 168 hours. No results for input(s): LIPASE, AMYLASE in the last 168 hours. No results for input(s): AMMONIA in the last 168 hours.  CBC: Recent Labs  Lab 07/15/19 0542  WBC 11.2*  HGB 8.0*  HCT 26.7*  MCV 103.1*  PLT 318    Cardiac Enzymes: No results for input(s): CKTOTAL, CKMB, CKMBINDEX, TROPONINI in the last 168 hours.  BNP (last 3 results) No results for  input(s): BNP in the last 8760 hours.  ProBNP (last 3 results) No results for input(s): PROBNP in the last 8760 hours.  Radiological Exams: No results found.  Assessment/Plan Active Problems:   Acute on chronic respiratory failure with hypoxia (HCC)   Chronic atrial fibrillation (HCC)   COVID-19 virus infection   Severe sepsis (HCC)   Multifocal pneumonia   Metabolic encephalopathy   1. Acute on chronic respiratory failure hypoxia we will continue with the weaning attempt and pressure support currently is on 12/5 goal of 1 hour. 2. Chronic atrial fibrillation rate controlled 3. COVID-19 virus infection treated resolving 4. Severe sepsis resolved 5. Multifocal pneumonia slow to improve we will continue to monitor closely 6. Metabolic encephalopathy no change   I have personally seen and evaluated the patient, evaluated laboratory and imaging results, formulated the assessment and plan and placed orders. The Patient requires high complexity decision making with multiple systems involvement.  Rounds were done with the Respiratory Therapy Director and Staff therapists and discussed with nursing staff also.  Yevonne Pax, MD [facility] Pulmonary Critical Care Medicine Sleep Medicine

## 2019-07-16 DIAGNOSIS — G9341 Metabolic encephalopathy: Secondary | ICD-10-CM | POA: Diagnosis not present

## 2019-07-16 DIAGNOSIS — U071 COVID-19: Secondary | ICD-10-CM | POA: Diagnosis not present

## 2019-07-16 DIAGNOSIS — J9621 Acute and chronic respiratory failure with hypoxia: Secondary | ICD-10-CM | POA: Diagnosis not present

## 2019-07-16 DIAGNOSIS — I482 Chronic atrial fibrillation, unspecified: Secondary | ICD-10-CM | POA: Diagnosis not present

## 2019-07-16 NOTE — Progress Notes (Addendum)
Pulmonary Critical Care Medicine [facility] [place]   PULMONARY CRITICAL CARE SERVICE  PROGRESS NOTE  Date of Service: 07/16/2019  Jasmeet Gehl  OXB:[id number]  DOB: 07-09-1945   DOA: 06/23/2019  Referring Physician: Carron Curie, MD  HPI: Henry Mcbride is a 74 y.o. male seen for follow up of Acute on Chronic Respiratory Failure.  Patient is on T collar currently on 40% FiO2 with a goal of 16 hours  Medications: Reviewed on Rounds  Physical Exam:  Vitals: Temperature is 96.9 pulse 71 respiratory 30 blood pressure 100/79 saturations 100%  Ventilator Settings off the ventilator on T collar currently FiO2 40%  . General: Comfortable at this time . Eyes: Grossly normal lids, irises & conjunctiva . ENT: grossly tongue is normal . Neck: no obvious mass . Cardiovascular: S1 S2 normal no gallop . Respiratory: No rhonchi no rales are noted . Abdomen: soft . Skin: no rash seen on limited exam . Musculoskeletal: not rigid . Psychiatric:unable to assess . Neurologic: no seizure no involuntary movements         Lab Data:   Basic Metabolic Panel: Recent Labs  Lab 07/10/19 0431 07/12/19 0743 07/15/19 0542  NA 147* 143 147*  K 4.3 4.7 5.0  CL 97* 99 106  CO2 41* 35* 29  GLUCOSE 44* 80 90  BUN 106* 84* 70*  CREATININE 2.22* 2.20* 1.86*  CALCIUM 11.1* 10.3 9.0    ABG: No results for input(s): PHART, PCO2ART, PO2ART, HCO3, O2SAT in the last 168 hours.  Liver Function Tests: No results for input(s): AST, ALT, ALKPHOS, BILITOT, PROT, ALBUMIN in the last 168 hours. No results for input(s): LIPASE, AMYLASE in the last 168 hours. No results for input(s): AMMONIA in the last 168 hours.  CBC: Recent Labs  Lab 07/15/19 0542  WBC 11.2*  HGB 8.0*  HCT 26.7*  MCV 103.1*  PLT 318    Cardiac Enzymes: No results for input(s): CKTOTAL, CKMB, CKMBINDEX, TROPONINI in the last 168 hours.  BNP (last 3 results) No results for input(s): BNP in the last 8760  hours.  ProBNP (last 3 results) No results for input(s): PROBNP in the last 8760 hours.  Radiological Exams: No results found.  Assessment/Plan Active Problems:   Acute on chronic respiratory failure with hypoxia (HCC)   Chronic atrial fibrillation (HCC)   COVID-19 virus infection   Severe sepsis (HCC)   Multifocal pneumonia   Metabolic encephalopathy   1. Acute on chronic respiratory failure hypoxia we will continue with T collar FiO2 40% goal of 16 hours 2. Chronic atrial fibrillation rate controlled 3. COVID-19 virus infection treated resolved 4. Severe sepsis resolved 5. Multifocal pneumonia at baseline 6. Metabolic encephalopathy no change   I have personally seen and evaluated the patient, evaluated laboratory and imaging results, formulated the assessment and plan and placed orders. The Patient requires high complexity decision making with multiple systems involvement.  Rounds were done with the Respiratory Therapy Director and Staff therapists and discussed with nursing staff also.  Yevonne Pax, MD [facility] Pulmonary Critical Care Medicine Sleep Medicine

## 2019-07-17 ENCOUNTER — Other Ambulatory Visit (HOSPITAL_COMMUNITY): Payer: Medicare Other

## 2019-07-17 DIAGNOSIS — I482 Chronic atrial fibrillation, unspecified: Secondary | ICD-10-CM | POA: Diagnosis not present

## 2019-07-17 DIAGNOSIS — G9341 Metabolic encephalopathy: Secondary | ICD-10-CM | POA: Diagnosis not present

## 2019-07-17 DIAGNOSIS — U071 COVID-19: Secondary | ICD-10-CM | POA: Diagnosis not present

## 2019-07-17 DIAGNOSIS — J9621 Acute and chronic respiratory failure with hypoxia: Secondary | ICD-10-CM | POA: Diagnosis not present

## 2019-07-17 LAB — CBC
HCT: 24.2 % — ABNORMAL LOW (ref 39.0–52.0)
Hemoglobin: 7.1 g/dL — ABNORMAL LOW (ref 13.0–17.0)
MCH: 30.6 pg (ref 26.0–34.0)
MCHC: 29.3 g/dL — ABNORMAL LOW (ref 30.0–36.0)
MCV: 104.3 fL — ABNORMAL HIGH (ref 80.0–100.0)
Platelets: 335 10*3/uL (ref 150–400)
RBC: 2.32 MIL/uL — ABNORMAL LOW (ref 4.22–5.81)
RDW: 19.8 % — ABNORMAL HIGH (ref 11.5–15.5)
WBC: 10.9 10*3/uL — ABNORMAL HIGH (ref 4.0–10.5)
nRBC: 0 % (ref 0.0–0.2)

## 2019-07-17 LAB — BASIC METABOLIC PANEL
Anion gap: 8 (ref 5–15)
BUN: 59 mg/dL — ABNORMAL HIGH (ref 8–23)
CO2: 29 mmol/L (ref 22–32)
Calcium: 8.4 mg/dL — ABNORMAL LOW (ref 8.9–10.3)
Chloride: 107 mmol/L (ref 98–111)
Creatinine, Ser: 1.58 mg/dL — ABNORMAL HIGH (ref 0.61–1.24)
GFR calc Af Amer: 49 mL/min — ABNORMAL LOW (ref >60)
GFR calc non Af Amer: 42 mL/min — ABNORMAL LOW (ref >60)
Glucose, Bld: 109 mg/dL — ABNORMAL HIGH (ref 70–99)
Potassium: 3.9 mmol/L (ref 3.5–5.1)
Sodium: 144 mmol/L (ref 135–145)

## 2019-07-17 NOTE — Consult Note (Signed)
Infectious Disease Consultation   Henry Mcbride  PIR:[id number]  DOB: 07/21/1945  DOA: 06/23/2019  Requesting physician: Dr.Brown  Reason for consultation: Antibiotic recommendations  History of Present Illness: Henry Mcbride is an 74 y.o. male with multiple medical problems including seizures, hypertension, congestive heart failure with EF 30 to 35% with global hypokinesis, ischemic cardiomyopathy status post ICD who initially presented on 05/26/2019 2 [facility] from [facility] emergency room in [facility] for acute hypoxemic respiratory failure secondary to COVID-19 pneumonia and worsening renal function.  He apparently was a resident of [facility] assisted [facility] facility.  Patient was noted to have decreased level of consciousness with increased weakness after he had been diagnosed with COVID-19 on 312 throat 04/23/2019.  In the ER at [facility] in [facility] he was noted to be hypotensive and also in acute renal failure with a creatinine of 4.  Chest x-ray showed medial right basilar infiltrates.  He was started on broad-spectrum antimicrobials and also was given IV fluid resuscitation.  He was noted to have bilateral pneumonia in the setting of heart failure with reduced ejection fraction.  On 05/26/2019 he was transferred to [facility] for worsening hypoxemic respiratory failure.  On 05/31/2019 he underwent thoracentesis and was extubated on 06/03/2019.  However, he had to be reintubated again.  On 06/17/2019 he underwent tracheostomy placement as well as a PEG tube placement.  While at the outside facility he continued to have fevers and had several rounds of antibiotics.  His sputum cultures on 06/12/2019 were noted to be positive for ESBL E. coli and was treated with IV Avycaz.  He also had hyponatremia and was started on D5W.  Due to his complex medical problems he was transferred to [facility] for further care on  06/23/2019.  After presentation here he had respiratory cultures collected on 07/14/2019 that showed moderate MRSA, few E. coli confirmed ESBL.   Review of Systems:  ROS As per HPI otherwise 10 point review of systems negative.  Unacceptable ROS statements: "10 systems reviewed," "Extensive" (without elaboration).  Acceptable ROS statements: "All others negative," "All others reviewed and are negative," and "All others unremarkable," with at LEAST ONE ROS documented Can't double dip - if using for HPI can't use for ROS   Past Medical History: Past Medical History:  Diagnosis Date  . Acute on chronic respiratory failure with hypoxia (HCC)   . Anxiety   . Arthritis   . CHF (congestive heart failure) (HCC)    dilated non-ischemic cardiomyopathy; followed at [facility]  . Chronic atrial fibrillation (HCC)   . CKD (chronic kidney disease)   . COVID-19 virus infection   . Cyst of kidney, acquired   . Depression   . Diabetes mellitus without complication (HCC)   . Hypertension   . Hypothyroidism   . Mental disorder   . Metabolic encephalopathy   . Multifocal pneumonia   . Schizo affective schizophrenia (HCC)   . Seizures (HCC)    last was 3-4 years ago  . Severe sepsis (HCC)   . Shortness of breath   . Tardive dyskinesia     Past Surgical History: Past Surgical History:  Procedure Laterality Date  . ANKLE ARTHROSCOPY  right  . COLON RESECTION  July 2014   had mass surgery in [place]  . COLONOSCOPY    . HERNIA REPAIR     times 2  . IR THORACENTESIS  ASP PLEURAL SPACE W/IMG GUIDE  07/09/2019  . POSTERIOR CERVICAL FUSION/FORAMINOTOMY N/A 10/28/2012   Procedure: CERVICAL TWO THREE, CERVICAL THREE FOUR, CERVICAL FOUR FIVE, CERVICAL FIVE SIX, CERVICAL SIX SEVEN POSTERIOR CERVICAL FUSION WITH LATERAL MASS FIXATION;  Surgeon: Temple PaciniHenry A Pool, MD;  Location: [facility];  Service: Neurosurgery;  Laterality: N/A;  POSTERIOR CERVICAL FUSION/FORAMINOTOMY LEVEL 5  .  TRANSTHORACIC ECHOCARDIOGRAM  06/20/12   mild to moderate LV systolic dysfunction with akinesisa of basilar and mild inferolateral segments, EF 45% (visual 40%), impaired relaxation, mildly dilated LA  . WRIST ARTHROPLASTY     at age 74     Allergies:   Allergies  Allergen Reactions  . Lithium Diarrhea and Other (See Comments)    Frequency in urination  Frequent urination     Social History:  reports that he has never smoked. He has never used smokeless tobacco. He reports that he does not drink alcohol and does not use drugs.   Family History: Noncontributory to the present illness  Physical Exam: Vitals: Temperature 98.1, pulse 78, respiratory rate 24, blood pressure 96/57, pulse oximetry 98% on 35% FiO2, PEEP of 5 Constitutional: Encephalopathy, not in any acute distress Head: Atraumatic, normocephalic Eyes: PERLA ENMT: external ears and nose appear normal, moist oral mucosa Neck: no masses, trach CVS: S1-S2, no murmur Respiratory: Rhonchi, no wheezing, left chest wall pacemaker Abdomen: soft, nondistended, positive bowel sounds Musculoskeletal: No edema                      Neuro: Encephalopathy, unable to do neurologic exam Psych: Unable to assess at this time Skin: no rashes; unstageable sacrococcygeal pressure ulcer   Data reviewed:  I have personally reviewed following labs and imaging studies Labs:  CBC: Recent Labs  Lab 07/15/19 0542 07/17/19 0610  WBC 11.2* 10.9*  HGB 8.0* 7.1*  HCT 26.7* 24.2*  MCV 103.1* 104.3*  PLT 318 335    Basic Metabolic Panel: Recent Labs  Lab 07/12/19 0743 07/12/19 0743 07/15/19 0542 07/17/19 0610  NA 143  --  147* 144  K 4.7   < > 5.0 3.9  CL 99  --  106 107  CO2 35*  --  29 29  GLUCOSE 80  --  90 109*  BUN 84*  --  70* 59*  CREATININE 2.20*  --  1.86* 1.58*  CALCIUM 10.3  --  9.0 8.4*   < > = values in this interval not displayed.   GFR CrCl cannot be calculated (Unknown ideal weight.). Liver Function  Tests: No results for input(s): AST, ALT, ALKPHOS, BILITOT, PROT, ALBUMIN in the last 168 hours. No results for input(s): LIPASE, AMYLASE in the last 168 hours. No results for input(s): AMMONIA in the last 168 hours. Coagulation profile No results for input(s): INR, PROTIME in the last 168 hours.  Cardiac Enzymes: No results for input(s): CKTOTAL, CKMB, CKMBINDEX, TROPONINI in the last 168 hours. BNP: Invalid input(s): POCBNP CBG: No results for input(s): GLUCAP in the last 168 hours. D-Dimer No results for input(s): DDIMER in the last 72 hours. Hgb A1c No results for input(s): HGBA1C in the last 72 hours. Lipid Profile No results for input(s): CHOL, HDL, LDLCALC, TRIG, CHOLHDL, LDLDIRECT in the last 72 hours. Thyroid function studies No results for input(s): TSH, T4TOTAL, T3FREE, THYROIDAB in the last 72 hours.  Invalid input(s): FREET3 Anemia work up No results for input(s): VITAMINB12, FOLATE, FERRITIN, TIBC, IRON, RETICCTPCT in the last 72 hours. Urinalysis  Component Value Date/Time   COLORURINE YELLOW 06/24/2019 1155   APPEARANCEUR HAZY (A) 06/24/2019 1155   LABSPEC 1.006 06/24/2019 1155   PHURINE 7.0 06/24/2019 1155   GLUCOSEU NEGATIVE 06/24/2019 1155   HGBUR LARGE (A) 06/24/2019 1155   BILIRUBINUR NEGATIVE 06/24/2019 1155   KETONESUR NEGATIVE 06/24/2019 1155   PROTEINUR 30 (A) 06/24/2019 1155   NITRITE NEGATIVE 06/24/2019 1155   LEUKOCYTESUR LARGE (A) 06/24/2019 1155     Microbiology Recent Results (from the past 240 hour(s))  SARS CORONAVIRUS 2 (TAT 6-24 HRS) Nasopharyngeal Nasopharyngeal Swab     Status: None   Collection Time: 07/08/19  9:39 AM   Specimen: Nasopharyngeal Swab  Result Value Ref Range Status   SARS Coronavirus 2 NEGATIVE NEGATIVE Final    Comment: (NOTE) SARS-CoV-2 target nucleic acids are NOT DETECTED. The SARS-CoV-2 RNA is generally detectable in upper and lower respiratory specimens during the acute phase of infection.  Negative results do not preclude SARS-CoV-2 infection, do not rule out co-infections with other pathogens, and should not be used as the sole basis for treatment or other patient management decisions. Negative results must be combined with clinical observations, patient history, and epidemiological information. The expected result is Negative. Fact Sheet for Patients: HairSlick.no Fact Sheet for Healthcare Providers: quierodirigir.com This test is not yet approved or cleared by the [place] FDA and  has been authorized for detection and/or diagnosis of SARS-CoV-2 by FDA under an Emergency Use Authorization (EUA). This EUA will remain  in effect (meaning this test can be used) for the duration of the COVID-19 declaration under Section 56 4(b)(1) of the Act, 21 U.S.C. section 360bbb-3(b)(1), unless the authorization is terminated or revoked sooner. Performed at [facility], [address] [place], [place] [address]   C Difficile Quick Screen (NO PCR Reflex)     Status: None   Collection Time: 07/14/19  2:26 PM   Specimen: STOOL  Result Value Ref Range Status   C Diff antigen NEGATIVE NEGATIVE Final   C Diff toxin NEGATIVE NEGATIVE Final   C Diff interpretation No C. difficile detected.  Final    Comment: Performed at [facility], [address] [place], [place] [address]       Inpatient Medications:   Please see MAR  Radiological Exams on Admission: DG CHEST PORT 1 VIEW  Result Date: 07/17/2019 CLINICAL DATA:  Pleural effusion, pneumonia EXAM: PORTABLE CHEST 1 VIEW COMPARISON:  Radiograph 07/09/2019, CT 07/07/2019 FINDINGS: Tracheostomy tube terminates in the upper trachea, 9 cm from the carina. AICD battery pack overlies the left chest wall with leads in stable position at the right atrium and cardiac apex. Telemetry leads and additional support device overlie the chest. There are persistent bilateral  pleural effusions, likely with a larger sub pulmonic component on the right and adjacent passive atelectasis. More diffuse heterogeneous mixed interstitial airspace opacities throughout both lungs are similar to prior. No pneumothorax. Stable cardiomediastinal contours accounting for differences in rotation and technique. No acute osseous or soft tissue abnormality. IMPRESSION: 1. Persistent bilateral pleural effusions, likely with a sub pulmonic component on the right and adjacent passive atelectasis. 2. Persistent diffuse mixed interstitial and airspace opacities throughout both lungs, not significantly changed from comparison. 3. Support devices as above. Electronically Signed   By: Kreg Shropshire M.D.   On: 07/17/2019 06:44    Impression/Recommendations Active Problems:   Acute on chronic respiratory failure with hypoxia (HCC)   Chronic atrial fibrillation (HCC)   COVID-19 virus  infection   Severe sepsis (HCC)   Multifocal pneumonia   Metabolic encephalopathy Pneumonia with MRSA, E. Coli Sacrococcygeal pressure ulcer unstageable Acute on chronic kidney disease stage III Diabetes mellitus type 2 Systolic congestive heart failure with EF 30 to 35% Dysphagia Bilateral pleural effusion Sick sinus syndrome status post pacemaker Protein calorie malnutrition History of schizophrenia  Acute on chronic respiratory failure with hypoxemia: Patient ventilator dependent status post tracheostomy.  Initially started as COVID-19 infection.  After that he had multifocal pneumonia.  Respiratory cultures collected from here that showed MRSA, few ESBL E. coli.  He also had pneumonia with ESBL E. coli at the outside facility and was already treated for that.  Currently on treatment with IV vancomycin, Zosyn which was started on 07/10/2019 for the MRSA pneumonia. Chest x-ray from today continue to show persistent diffuse mixed interstitial and airspace opacities throughout both lungs.  He got 7 days of IV Zosyn..   Do not think the Zosyn needs to be extended as it was not covering the E. coli anyway.  However, given the chest x-ray findings and the respiratory culture showing MRSA would recommend to extend the IV vancomycin with plan to treat for total of 10 days pending improvement.  Regarding the ESBL E. Coli, it was only few and I believe this probably is residual from prior infection.  Continue to monitor.  If his respite status is worsening would recommend to repeat chest imaging preferably chest CT without contrast to better evaluate and repeat respiratory cultures and also add IV meropenem to the regimen.  Pneumonia: As mentioned above respiratory culture showing MRSA, few E. coli ESBL.  Antibiotics as mentioned above.  He also has dysphagia, at risk for aspiration and worsening respiratory failure secondary to aspiration pneumonia.  Sacrococcygeal pressure ulcer: Unstageable.  Continue local wound care.  If his wound is worsening then consider surgical evaluation for possible debridement.  Severe sepsis: Patient previously had septic shock secondary to bilateral bacterial pneumonia.  Currently shock is resolved.  Antibiotics as mentioned above.  He is however, at risk for recurrent sepsis.  Please continue to monitor.  Previously UA showed evidence of UTI however, urine cultures were negative.  COVID-19 infection: He status post treatment at the outside facility.  Here he has received hydroxyurea.  Continue supportive management per primary team.  Acute on chronic renal failure: He likely has stage III CKD.  Now with worsening.  Creatinine is improving a little bit.  Please monitor BUN/trending closely while on antibiotics and adjust dose accordingly.  Avoid nephrotoxic medications. Would also suggest cautious IV hydration given the systolic congestive heart failure.  Further management per the primary team.  Diabetes mellitus type 2: Continue to monitor Accu-Cheks, medications for diabetes per the primary  team.  He will need proper ischemic control in order to enable wound healing.  Bilateral pleural effusion: He is status post thoracentesis on the right.  Further management per primary team and pulmonary.  Systolic congestive heart failure: Patient apparently has systolic congestive heart failure with EF around 30-35%.  He is on diuretics.  Other medications per the primary team.  Please monitor BUN/creatinine closely while on the diuretics.  Dysphagia/protein calorie malnutrition: Management per primary team.  Unfortunately due to his dysphagia he is at risk for aspiration and aspiration pneumonia.  Diarrhea: He previously had diarrhea which is now improved.  His stool for C. difficile is negative.  History of schizophrenia: Continue medications per primary team.  Due to his complex medical  problems he is at risk for worsening and decompensation.  Plan of care discussed with the primary team and pharmacy.  Thank you for this consultation.   Yaakov Guthrie M.D. 07/17/2019, 3:47 PM

## 2019-07-17 NOTE — Progress Notes (Signed)
Pulmonary Critical Care Medicine [place]   PULMONARY CRITICAL CARE SERVICE  PROGRESS NOTE  Date of Service: 07/17/2019  Edoardo Laforte  BOF:[id number]  DOB: 25-Jun-1945   DOA: 06/23/2019  Referring Physician: Merton Border, MD  HPI: Henry Mcbride is a 74 y.o. male seen for follow up of Acute on Chronic Respiratory Failure.  Patient is on pressure support currently on 35% FiO2  Medications: Reviewed on Rounds  Physical Exam:  Vitals: Temperature is 96.7 pulse 82 respiratory rate 35 blood pressure is 100/60 saturations 100%  Ventilator Settings on pressure support FiO2 35% pressure support 12 PEEP 5  . General: Comfortable at this time . Eyes: Grossly normal lids, irises & conjunctiva . ENT: grossly tongue is normal . Neck: no obvious mass . Cardiovascular: S1 S2 normal no gallop . Respiratory: No rhonchi coarse breath sounds . Abdomen: soft . Skin: no rash seen on limited exam . Musculoskeletal: not rigid . Psychiatric:unable to assess . Neurologic: no seizure no involuntary movements         Lab Data:   Basic Metabolic Panel: Recent Labs  Lab 07/12/19 0743 07/15/19 0542 07/17/19 0610  NA 143 147* 144  K 4.7 5.0 3.9  CL 99 106 107  CO2 35* 29 29  GLUCOSE 80 90 109*  BUN 84* 70* 59*  CREATININE 2.20* 1.86* 1.58*  CALCIUM 10.3 9.0 8.4*    ABG: No results for input(s): PHART, PCO2ART, PO2ART, HCO3, O2SAT in the last 168 hours.  Liver Function Tests: No results for input(s): AST, ALT, ALKPHOS, BILITOT, PROT, ALBUMIN in the last 168 hours. No results for input(s): LIPASE, AMYLASE in the last 168 hours. No results for input(s): AMMONIA in the last 168 hours.  CBC: Recent Labs  Lab 07/15/19 0542 07/17/19 0610  WBC 11.2* 10.9*  HGB 8.0* 7.1*  HCT 26.7* 24.2*  MCV 103.1* 104.3*  PLT 318 335    Cardiac Enzymes: No results for input(s): CKTOTAL, CKMB, CKMBINDEX, TROPONINI in the last 168 hours.  BNP (last 3 results) No results for  input(s): BNP in the last 8760 hours.  ProBNP (last 3 results) No results for input(s): PROBNP in the last 8760 hours.  Radiological Exams: DG CHEST PORT 1 VIEW  Result Date: 07/17/2019 CLINICAL DATA:  Pleural effusion, pneumonia EXAM: PORTABLE CHEST 1 VIEW COMPARISON:  Radiograph 07/09/2019, CT 07/07/2019 FINDINGS: Tracheostomy tube terminates in the upper trachea, 9 cm from the carina. AICD battery pack overlies the left chest wall with leads in stable position at the right atrium and cardiac apex. Telemetry leads and additional support device overlie the chest. There are persistent bilateral pleural effusions, likely with a larger sub pulmonic component on the right and adjacent passive atelectasis. More diffuse heterogeneous mixed interstitial airspace opacities throughout both lungs are similar to prior. No pneumothorax. Stable cardiomediastinal contours accounting for differences in rotation and technique. No acute osseous or soft tissue abnormality. IMPRESSION: 1. Persistent bilateral pleural effusions, likely with a sub pulmonic component on the right and adjacent passive atelectasis. 2. Persistent diffuse mixed interstitial and airspace opacities throughout both lungs, not significantly changed from comparison. 3. Support devices as above. Electronically Signed   By: Lovena Le M.D.   On: 07/17/2019 06:44    Assessment/Plan Active Problems:   Acute on chronic respiratory failure with hypoxia (HCC)   Chronic atrial fibrillation ([place])   COVID-19 virus infection   Severe sepsis (HCC)   Multifocal pneumonia   Metabolic encephalopathy   1. Acute on chronic respiratory failure  hypoxia we will continue with pressure support try to wean further patient has a lot of issues with anxiety limiting factor. 2. Chronic atrial fibrillation rate is controlled at this time 3. COVID-19 virus infection treated we will continue to follow. 4. Severe sepsis resolved continue present  management 5. Metabolic encephalopathy remains confused   I have personally seen and evaluated the patient, evaluated laboratory and imaging results, formulated the assessment and plan and placed orders. The Patient requires high complexity decision making with multiple systems involvement.  Rounds were done with the Respiratory Therapy Director and Staff therapists and discussed with nursing staff also.  Yevonne Pax, MD [facility] Pulmonary Critical Care Medicine Sleep Medicine

## 2019-07-18 DIAGNOSIS — J9621 Acute and chronic respiratory failure with hypoxia: Secondary | ICD-10-CM | POA: Diagnosis not present

## 2019-07-18 DIAGNOSIS — I482 Chronic atrial fibrillation, unspecified: Secondary | ICD-10-CM | POA: Diagnosis not present

## 2019-07-18 DIAGNOSIS — G9341 Metabolic encephalopathy: Secondary | ICD-10-CM | POA: Diagnosis not present

## 2019-07-18 DIAGNOSIS — U071 COVID-19: Secondary | ICD-10-CM | POA: Diagnosis not present

## 2019-07-18 LAB — CBC
HCT: 28.9 % — ABNORMAL LOW (ref 39.0–52.0)
Hemoglobin: 8.3 g/dL — ABNORMAL LOW (ref 13.0–17.0)
MCH: 30.2 pg (ref 26.0–34.0)
MCHC: 28.7 g/dL — ABNORMAL LOW (ref 30.0–36.0)
MCV: 105.1 fL — ABNORMAL HIGH (ref 80.0–100.0)
Platelets: 339 10*3/uL (ref 150–400)
RBC: 2.75 MIL/uL — ABNORMAL LOW (ref 4.22–5.81)
RDW: 19.5 % — ABNORMAL HIGH (ref 11.5–15.5)
WBC: 10.5 10*3/uL (ref 4.0–10.5)
nRBC: 0.2 % (ref 0.0–0.2)

## 2019-07-18 LAB — BASIC METABOLIC PANEL
Anion gap: 3 — ABNORMAL LOW (ref 5–15)
BUN: 65 mg/dL — ABNORMAL HIGH (ref 8–23)
CO2: 31 mmol/L (ref 22–32)
Calcium: 8.8 mg/dL — ABNORMAL LOW (ref 8.9–10.3)
Chloride: 106 mmol/L (ref 98–111)
Creatinine, Ser: 1.48 mg/dL — ABNORMAL HIGH (ref 0.61–1.24)
GFR calc Af Amer: 53 mL/min — ABNORMAL LOW (ref >60)
GFR calc non Af Amer: 46 mL/min — ABNORMAL LOW (ref >60)
Glucose, Bld: 181 mg/dL — ABNORMAL HIGH (ref 70–99)
Potassium: 5.5 mmol/L — ABNORMAL HIGH (ref 3.5–5.1)
Sodium: 140 mmol/L (ref 135–145)

## 2019-07-18 LAB — VANCOMYCIN, TROUGH: Vancomycin Tr: 34 ug/mL (ref 15–20)

## 2019-07-18 LAB — POTASSIUM: Potassium: 3.7 mmol/L (ref 3.5–5.1)

## 2019-07-18 NOTE — Progress Notes (Addendum)
Pulmonary Critical Care Medicine [facility]   PULMONARY CRITICAL CARE SERVICE  PROGRESS NOTE  Date of Service: 07/18/2019  Henry Mcbride  BSJ:[id number]  DOB: 09-Apr-1945   DOA: 06/23/2019  Referring Physician: Carron Curie, MD  HPI: Henry Mcbride is a 74 y.o. male seen for follow up of Acute on Chronic Respiratory Failure.  Patient failed wean today to aerosol trach collar remains on full support on the ventilator currently 35% FiO2 satting well.  Medications: Reviewed on Rounds  Physical Exam:  Vitals: Pulse 76 respirations 39 BP 103/69 O2 sat 100% temp 96.8  Ventilator Settings ventilator mode AC VC rate of 20 tidal volume 450 PEEP of 5 and FiO2 of 35%  . General: Comfortable at this time . Eyes: Grossly normal lids, irises & conjunctiva . ENT: grossly tongue is normal . Neck: no obvious mass . Cardiovascular: S1 S2 normal no gallop . Respiratory: No rales or rhonchi noted . Abdomen: soft . Skin: no rash seen on limited exam . Musculoskeletal: not rigid . Psychiatric:unable to assess . Neurologic: no seizure no involuntary movements         Lab Data:   Basic Metabolic Panel: Recent Labs  Lab 07/12/19 0743 07/15/19 0542 07/17/19 0610 07/18/19 0522 07/18/19 1822  NA 143 147* 144 140  --   K 4.7 5.0 3.9 5.5* 3.7  CL 99 106 107 106  --   CO2 35* 29 29 31   --   GLUCOSE 80 90 109* 181*  --   BUN 84* 70* 59* 65*  --   CREATININE 2.20* 1.86* 1.58* 1.48*  --   CALCIUM 10.3 9.0 8.4* 8.8*  --     ABG: No results for input(s): PHART, PCO2ART, PO2ART, HCO3, O2SAT in the last 168 hours.  Liver Function Tests: No results for input(s): AST, ALT, ALKPHOS, BILITOT, PROT, ALBUMIN in the last 168 hours. No results for input(s): LIPASE, AMYLASE in the last 168 hours. No results for input(s): AMMONIA in the last 168 hours.  CBC: Recent Labs  Lab 07/15/19 0542 07/17/19 0610 07/18/19 0522  WBC 11.2* 10.9* 10.5  HGB 8.0* 7.1* 8.3*  HCT 26.7* 24.2*  28.9*  MCV 103.1* 104.3* 105.1*  PLT 318 335 339    Cardiac Enzymes: No results for input(s): CKTOTAL, CKMB, CKMBINDEX, TROPONINI in the last 168 hours.  BNP (last 3 results) No results for input(s): BNP in the last 8760 hours.  ProBNP (last 3 results) No results for input(s): PROBNP in the last 8760 hours.  Radiological Exams: DG CHEST PORT 1 VIEW  Result Date: 07/17/2019 CLINICAL DATA:  Pleural effusion, pneumonia EXAM: PORTABLE CHEST 1 VIEW COMPARISON:  Radiograph 07/09/2019, CT 07/07/2019 FINDINGS: Tracheostomy tube terminates in the upper trachea, 9 cm from the carina. AICD battery pack overlies the left chest wall with leads in stable position at the right atrium and cardiac apex. Telemetry leads and additional support device overlie the chest. There are persistent bilateral pleural effusions, likely with a larger sub pulmonic component on the right and adjacent passive atelectasis. More diffuse heterogeneous mixed interstitial airspace opacities throughout both lungs are similar to prior. No pneumothorax. Stable cardiomediastinal contours accounting for differences in rotation and technique. No acute osseous or soft tissue abnormality. IMPRESSION: 1. Persistent bilateral pleural effusions, likely with a sub pulmonic component on the right and adjacent passive atelectasis. 2. Persistent diffuse mixed interstitial and airspace opacities throughout both lungs, not significantly changed from comparison. 3. Support devices as above. Electronically Signed   By: 07/09/2019  [facility] M.D.   On: 07/17/2019 06:44    Assessment/Plan Active Problems:   Acute on chronic respiratory failure with hypoxia (HCC)   Chronic atrial fibrillation ([place])   COVID-19 virus infection   Severe sepsis (HCC)   Multifocal pneumonia   Metabolic encephalopathy   1. Acute on chronic respiratory failure hypoxia we will continue with pressure support try to wean further patient continues to have issues with anxiety.   Continue aggressive pulmonary toilet supportive measures. 2. Chronic atrial fibrillation rate is controlled at this time 3. COVID-19 virus infection treated we will continue to follow. 4. Severe sepsis resolved continue present management 5. Metabolic encephalopathy remains confused   I have personally seen and evaluated the patient, evaluated laboratory and imaging results, formulated the assessment and plan and placed orders. The Patient requires high complexity decision making with multiple systems involvement.  Rounds were done with the Respiratory Therapy Director and Staff therapists and discussed with nursing staff also.  Allyne Gee, MD [facility] Pulmonary Critical Care Medicine Sleep Medicine

## 2019-07-19 DIAGNOSIS — U071 COVID-19: Secondary | ICD-10-CM | POA: Diagnosis not present

## 2019-07-19 DIAGNOSIS — I482 Chronic atrial fibrillation, unspecified: Secondary | ICD-10-CM | POA: Diagnosis not present

## 2019-07-19 DIAGNOSIS — J9621 Acute and chronic respiratory failure with hypoxia: Secondary | ICD-10-CM | POA: Diagnosis not present

## 2019-07-19 DIAGNOSIS — G9341 Metabolic encephalopathy: Secondary | ICD-10-CM | POA: Diagnosis not present

## 2019-07-19 DIAGNOSIS — J90 Pleural effusion, not elsewhere classified: Secondary | ICD-10-CM

## 2019-07-19 LAB — VANCOMYCIN, TROUGH: Vancomycin Tr: 24 ug/mL (ref 15–20)

## 2019-07-19 NOTE — Progress Notes (Signed)
Pulmonary Critical Care Medicine [facility] [place]   PULMONARY CRITICAL CARE SERVICE  PROGRESS NOTE  Date of Service: 07/19/2019  Mosie Angus  KXF:[id number]  DOB: 1945/07/10   DOA: 06/23/2019  Referring Physician: Carron Curie, MD  HPI: Mosi Hannold is a 74 y.o. male seen for follow up of Acute on Chronic Respiratory Failure.  Patient is on T collar has been on 35% FiO2 with good saturations at this time  Medications: Reviewed on Rounds  Physical Exam:  Vitals: Temperature is 98.5 pulse 95 respiratory 34 blood pressure is 100/69 saturations 100%  Ventilator Settings on T collar FiO2 35%  . General: Comfortable at this time . Eyes: Grossly normal lids, irises & conjunctiva . ENT: grossly tongue is normal . Neck: no obvious mass . Cardiovascular: S1 S2 normal no gallop . Respiratory: No rhonchi no rales are noted at this time . Abdomen: soft . Skin: no rash seen on limited exam . Musculoskeletal: not rigid . Psychiatric:unable to assess . Neurologic: no seizure no involuntary movements         Lab Data:   Basic Metabolic Panel: Recent Labs  Lab 07/15/19 0542 07/17/19 0610 07/18/19 0522 07/18/19 1822  NA 147* 144 140  --   K 5.0 3.9 5.5* 3.7  CL 106 107 106  --   CO2 29 29 31   --   GLUCOSE 90 109* 181*  --   BUN 70* 59* 65*  --   CREATININE 1.86* 1.58* 1.48*  --   CALCIUM 9.0 8.4* 8.8*  --     ABG: No results for input(s): PHART, PCO2ART, PO2ART, HCO3, O2SAT in the last 168 hours.  Liver Function Tests: No results for input(s): AST, ALT, ALKPHOS, BILITOT, PROT, ALBUMIN in the last 168 hours. No results for input(s): LIPASE, AMYLASE in the last 168 hours. No results for input(s): AMMONIA in the last 168 hours.  CBC: Recent Labs  Lab 07/15/19 0542 07/17/19 0610 07/18/19 0522  WBC 11.2* 10.9* 10.5  HGB 8.0* 7.1* 8.3*  HCT 26.7* 24.2* 28.9*  MCV 103.1* 104.3* 105.1*  PLT 318 335 339    Cardiac Enzymes: No results for input(s):  CKTOTAL, CKMB, CKMBINDEX, TROPONINI in the last 168 hours.  BNP (last 3 results) No results for input(s): BNP in the last 8760 hours.  ProBNP (last 3 results) No results for input(s): PROBNP in the last 8760 hours.  Radiological Exams: No results found.  Assessment/Plan Active Problems:   Acute on chronic respiratory failure with hypoxia (HCC)   Chronic atrial fibrillation (HCC)   COVID-19 virus infection   Severe sepsis (HCC)   Multifocal pneumonia   Metabolic encephalopathy   1. Acute on chronic respiratory failure hypoxia we will continue with the T collar patient is actually doing very well.  Patient's family was present in the room they were updated.  I encouraged the patient to be compliant with recommended therapy so we can get him on PMV and then eventually is eating 2. Chronic atrial fibrillation rate controlled at this time we will continue supportive care 3. COVID-19 virus infection treated clinically improving 4. Severe sepsis resolved 5. Multifocal pneumonia slow improvement 6. Metabolic encephalopathy slowly improving we will continue to follow along   I have personally seen and evaluated the patient, evaluated laboratory and imaging results, formulated the assessment and plan and placed orders. The Patient requires high complexity decision making with multiple systems involvement.  Rounds were done with the Respiratory Therapy Director and Staff therapists and discussed with  nursing staff also.  Time 35 minutes family conversation  Allyne Gee, MD [facility] Pulmonary Critical Care Medicine Sleep Medicine

## 2019-07-20 DIAGNOSIS — J9621 Acute and chronic respiratory failure with hypoxia: Secondary | ICD-10-CM | POA: Diagnosis not present

## 2019-07-20 DIAGNOSIS — U071 COVID-19: Secondary | ICD-10-CM | POA: Diagnosis not present

## 2019-07-20 DIAGNOSIS — G9341 Metabolic encephalopathy: Secondary | ICD-10-CM | POA: Diagnosis not present

## 2019-07-20 DIAGNOSIS — I482 Chronic atrial fibrillation, unspecified: Secondary | ICD-10-CM | POA: Diagnosis not present

## 2019-07-20 LAB — URINALYSIS, ROUTINE W REFLEX MICROSCOPIC
Bacteria, UA: NONE SEEN
Bilirubin Urine: NEGATIVE
Glucose, UA: NEGATIVE mg/dL
Hgb urine dipstick: NEGATIVE
Ketones, ur: NEGATIVE mg/dL
Nitrite: NEGATIVE
Protein, ur: 30 mg/dL — AB
Specific Gravity, Urine: 1.013 (ref 1.005–1.030)
pH: 7 (ref 5.0–8.0)

## 2019-07-20 NOTE — Progress Notes (Signed)
Pulmonary Critical Care Medicine [facility] [place]   PULMONARY CRITICAL CARE SERVICE  PROGRESS NOTE  Date of Service: 07/20/2019  Henry Mcbride  GUR:[id number]  DOB: Apr 15, 1945   DOA: 06/23/2019  Referring Physician: Carron Curie, MD  HPI: Henry Mcbride is a 74 y.o. male seen for follow up of Acute on Chronic Respiratory Failure.  Patient currently is on assist control on full support.  Yesterday was able to do T collar this morning is having a little bit of difficulty getting started.  Spoke to respiratory therapy wait for the sister to come in who can help [place] with coaching him actually did very well with her present in the room yesterday  Medications: Reviewed on Rounds  Physical Exam:  Vitals: Temperature is 98.4 pulse 95 respiratory rate 34 blood pressure 102/74 saturations 100%  Ventilator Settings on assist control FiO2 is 35% tidal volume 450 PEEP 5  . General: Comfortable at this time . Eyes: Grossly normal lids, irises & conjunctiva . ENT: grossly tongue is normal . Neck: no obvious mass . Cardiovascular: S1 S2 normal no gallop . Respiratory: No rhonchi coarse breath sounds . Abdomen: soft . Skin: no rash seen on limited exam . Musculoskeletal: not rigid . Psychiatric:unable to assess . Neurologic: no seizure no involuntary movements         Lab Data:   Basic Metabolic Panel: Recent Labs  Lab 07/15/19 0542 07/17/19 0610 07/18/19 0522 07/18/19 1822  NA 147* 144 140  --   K 5.0 3.9 5.5* 3.7  CL 106 107 106  --   CO2 29 29 31   --   GLUCOSE 90 109* 181*  --   BUN 70* 59* 65*  --   CREATININE 1.86* 1.58* 1.48*  --   CALCIUM 9.0 8.4* 8.8*  --     ABG: No results for input(s): PHART, PCO2ART, PO2ART, HCO3, O2SAT in the last 168 hours.  Liver Function Tests: No results for input(s): AST, ALT, ALKPHOS, BILITOT, PROT, ALBUMIN in the last 168 hours. No results for input(s): LIPASE, AMYLASE in the last 168 hours. No results for input(s): AMMONIA  in the last 168 hours.  CBC: Recent Labs  Lab 07/15/19 0542 07/17/19 0610 07/18/19 0522  WBC 11.2* 10.9* 10.5  HGB 8.0* 7.1* 8.3*  HCT 26.7* 24.2* 28.9*  MCV 103.1* 104.3* 105.1*  PLT 318 335 339    Cardiac Enzymes: No results for input(s): CKTOTAL, CKMB, CKMBINDEX, TROPONINI in the last 168 hours.  BNP (last 3 results) No results for input(s): BNP in the last 8760 hours.  ProBNP (last 3 results) No results for input(s): PROBNP in the last 8760 hours.  Radiological Exams: No results found.  Assessment/Plan Active Problems:   Acute on chronic respiratory failure with hypoxia (HCC)   Chronic atrial fibrillation (HCC)   COVID-19 virus infection   Severe sepsis (HCC)   Multifocal pneumonia   Metabolic encephalopathy   1. Acute on chronic respiratory failure hypoxia we will continue with assist control mode titrate oxygen continue pulmonary toilet.  Try on T collar today 2. Chronic atrial fibrillation rate is controlled 3. COVID-19 virus infection treated improving 4. Severe sepsis resolved we will continue to follow 5. Multifocal pneumonia clinically is improving 6. Metabolic encephalopathy still has periods of confusion and increased agitation   I have personally seen and evaluated the patient, evaluated laboratory and imaging results, formulated the assessment and plan and placed orders. The Patient requires high complexity decision making with multiple systems involvement.  Rounds  were done with the Respiratory Therapy Director and Staff therapists and discussed with nursing staff also.  Allyne Gee, MD [facility] Pulmonary Critical Care Medicine Sleep Medicine

## 2019-07-21 DIAGNOSIS — U071 COVID-19: Secondary | ICD-10-CM | POA: Diagnosis not present

## 2019-07-21 DIAGNOSIS — I482 Chronic atrial fibrillation, unspecified: Secondary | ICD-10-CM | POA: Diagnosis not present

## 2019-07-21 DIAGNOSIS — J9621 Acute and chronic respiratory failure with hypoxia: Secondary | ICD-10-CM | POA: Diagnosis not present

## 2019-07-21 DIAGNOSIS — G9341 Metabolic encephalopathy: Secondary | ICD-10-CM | POA: Diagnosis not present

## 2019-07-21 LAB — URINE CULTURE: Culture: NO GROWTH

## 2019-07-21 NOTE — Progress Notes (Signed)
Pulmonary Critical Care Medicine [facility] [place]   PULMONARY CRITICAL CARE SERVICE  PROGRESS NOTE  Date of Service: 07/21/2019  Kimani Hovis  VPX:[id number]  DOB: 1945/07/31   DOA: 06/23/2019  Referring Physician: Carron Curie, MD  HPI: Jovin Fester is a 74 y.o. male seen for follow up of Acute on Chronic Respiratory Failure.  Patient currently is on T collar has been on 40% FiO2 good saturations are noted at this time  Medications: Reviewed on Rounds  Physical Exam:  Vitals: Temperature 97.5 pulse 86 respiratory 23 blood pressure is 106/69 saturations 100%  Ventilator Settings on T collar FiO2 40%  . General: Comfortable at this time . Eyes: Grossly normal lids, irises & conjunctiva . ENT: grossly tongue is normal . Neck: no obvious mass . Cardiovascular: S1 S2 normal no gallop . Respiratory: No rhonchi coarse breath sounds are noted at this time . Abdomen: soft . Skin: no rash seen on limited exam . Musculoskeletal: not rigid . Psychiatric:unable to assess . Neurologic: no seizure no involuntary movements         Lab Data:   Basic Metabolic Panel: Recent Labs  Lab 07/15/19 0542 07/17/19 0610 07/18/19 0522 07/18/19 1822  NA 147* 144 140  --   K 5.0 3.9 5.5* 3.7  CL 106 107 106  --   CO2 29 29 31   --   GLUCOSE 90 109* 181*  --   BUN 70* 59* 65*  --   CREATININE 1.86* 1.58* 1.48*  --   CALCIUM 9.0 8.4* 8.8*  --     ABG: No results for input(s): PHART, PCO2ART, PO2ART, HCO3, O2SAT in the last 168 hours.  Liver Function Tests: No results for input(s): AST, ALT, ALKPHOS, BILITOT, PROT, ALBUMIN in the last 168 hours. No results for input(s): LIPASE, AMYLASE in the last 168 hours. No results for input(s): AMMONIA in the last 168 hours.  CBC: Recent Labs  Lab 07/15/19 0542 07/17/19 0610 07/18/19 0522  WBC 11.2* 10.9* 10.5  HGB 8.0* 7.1* 8.3*  HCT 26.7* 24.2* 28.9*  MCV 103.1* 104.3* 105.1*  PLT 318 335 339    Cardiac Enzymes: No  results for input(s): CKTOTAL, CKMB, CKMBINDEX, TROPONINI in the last 168 hours.  BNP (last 3 results) No results for input(s): BNP in the last 8760 hours.  ProBNP (last 3 results) No results for input(s): PROBNP in the last 8760 hours.  Radiological Exams: No results found.  Assessment/Plan Active Problems:   Acute on chronic respiratory failure with hypoxia (HCC)   Chronic atrial fibrillation (HCC)   COVID-19 virus infection   Severe sepsis (HCC)   Multifocal pneumonia   Metabolic encephalopathy   1. Acute on chronic respiratory failure hypoxia is weaning today on T collar looks good so far continue to encourage the weaning as tolerated. 2. Chronic atrial fibrillation rate controlled we will continue to follow. 3. COVID-19 virus infection treated in resolution phase 4. Severe sepsis resolved hemodynamics are stable 5. Multifocal pneumonia treated we will continue with supportive care 6. Metabolic encephalopathy no change   I have personally seen and evaluated the patient, evaluated laboratory and imaging results, formulated the assessment and plan and placed orders. The Patient requires high complexity decision making with multiple systems involvement.  Rounds were done with the Respiratory Therapy Director and Staff therapists and discussed with nursing staff also.  09/17/19, MD [facility] Pulmonary Critical Care Medicine Sleep Medicine

## 2019-07-22 ENCOUNTER — Other Ambulatory Visit (HOSPITAL_COMMUNITY): Payer: Medicare Other

## 2019-07-22 DIAGNOSIS — U071 COVID-19: Secondary | ICD-10-CM | POA: Diagnosis not present

## 2019-07-22 DIAGNOSIS — I482 Chronic atrial fibrillation, unspecified: Secondary | ICD-10-CM | POA: Diagnosis not present

## 2019-07-22 DIAGNOSIS — G9341 Metabolic encephalopathy: Secondary | ICD-10-CM | POA: Diagnosis not present

## 2019-07-22 DIAGNOSIS — J9621 Acute and chronic respiratory failure with hypoxia: Secondary | ICD-10-CM | POA: Diagnosis not present

## 2019-07-22 LAB — CBC
HCT: 28.8 % — ABNORMAL LOW (ref 39.0–52.0)
Hemoglobin: 8.1 g/dL — ABNORMAL LOW (ref 13.0–17.0)
MCH: 30.9 pg (ref 26.0–34.0)
MCHC: 28.1 g/dL — ABNORMAL LOW (ref 30.0–36.0)
MCV: 109.9 fL — ABNORMAL HIGH (ref 80.0–100.0)
Platelets: 338 10*3/uL (ref 150–400)
RBC: 2.62 MIL/uL — ABNORMAL LOW (ref 4.22–5.81)
RDW: 20.9 % — ABNORMAL HIGH (ref 11.5–15.5)
WBC: 12.4 10*3/uL — ABNORMAL HIGH (ref 4.0–10.5)
nRBC: 0.7 % — ABNORMAL HIGH (ref 0.0–0.2)

## 2019-07-22 LAB — BLOOD GAS, ARTERIAL
Acid-Base Excess: 10.6 mmol/L — ABNORMAL HIGH (ref 0.0–2.0)
Bicarbonate: 36.7 mmol/L — ABNORMAL HIGH (ref 20.0–28.0)
FIO2: 40
O2 Saturation: 89.3 %
Patient temperature: 36.8
pCO2 arterial: 70.6 mmHg (ref 32.0–48.0)
pH, Arterial: 7.335 — ABNORMAL LOW (ref 7.350–7.450)
pO2, Arterial: 52.1 mmHg — ABNORMAL LOW (ref 83.0–108.0)

## 2019-07-22 LAB — BASIC METABOLIC PANEL
Anion gap: 5 (ref 5–15)
BUN: 79 mg/dL — ABNORMAL HIGH (ref 8–23)
CO2: 38 mmol/L — ABNORMAL HIGH (ref 22–32)
Calcium: 9.3 mg/dL (ref 8.9–10.3)
Chloride: 105 mmol/L (ref 98–111)
Creatinine, Ser: 1.2 mg/dL (ref 0.61–1.24)
GFR calc Af Amer: >60 mL/min (ref >60)
GFR calc non Af Amer: 59 mL/min — ABNORMAL LOW (ref >60)
Glucose, Bld: 100 mg/dL — ABNORMAL HIGH (ref 70–99)
Potassium: 5 mmol/L (ref 3.5–5.1)
Sodium: 148 mmol/L — ABNORMAL HIGH (ref 135–145)

## 2019-07-22 NOTE — Progress Notes (Addendum)
Pulmonary Critical Care Medicine [facility] [place]   PULMONARY CRITICAL CARE SERVICE  PROGRESS NOTE  Date of Service: 07/22/2019  Henry Mcbride  BZJ:[id number]  DOB: 07/26/1945   DOA: 06/23/2019  Referring Physician: Carron Curie, MD  HPI: Henry Mcbride is a 74 y.o. male seen for follow up of Acute on Chronic Respiratory Failure.  Off the ventilator this morning patient was down 35% FiO2 hopefully today will be completing 24 hours  Medications: Reviewed on Rounds  Physical Exam:  Vitals: Temperature is 98.6 pulse 79 respiratory rate 24 blood pressure is 125/71 saturations 100%  Ventilator Settings on T collar with an FiO2 of 35%  . General: Comfortable at this time . Eyes: Grossly normal lids, irises & conjunctiva . ENT: grossly tongue is normal . Neck: no obvious mass . Cardiovascular: S1 S2 normal no gallop . Respiratory: No rhonchi coarse breath sounds . Abdomen: soft . Skin: no rash seen on limited exam . Musculoskeletal: not rigid . Psychiatric:unable to assess . Neurologic: no seizure no involuntary movements         Lab Data:   Basic Metabolic Panel: Recent Labs  Lab 07/17/19 0610 07/18/19 0522 07/18/19 1822 07/22/19 0737  NA 144 140  --  148*  K 3.9 5.5* 3.7 5.0  CL 107 106  --  105  CO2 29 31  --  38*  GLUCOSE 109* 181*  --  100*  BUN 59* 65*  --  79*  CREATININE 1.58* 1.48*  --  1.20  CALCIUM 8.4* 8.8*  --  9.3    ABG: Recent Labs  Lab 07/22/19 1740  PHART 7.335*  PCO2ART 70.6*  PO2ART 52.1*  HCO3 36.7*  O2SAT 89.3    Liver Function Tests: No results for input(s): AST, ALT, ALKPHOS, BILITOT, PROT, ALBUMIN in the last 168 hours. No results for input(s): LIPASE, AMYLASE in the last 168 hours. No results for input(s): AMMONIA in the last 168 hours.  CBC: Recent Labs  Lab 07/17/19 0610 07/18/19 0522 07/22/19 0737  WBC 10.9* 10.5 12.4*  HGB 7.1* 8.3* 8.1*  HCT 24.2* 28.9* 28.8*  MCV 104.3* 105.1* 109.9*  PLT 335 339  338    Cardiac Enzymes: No results for input(s): CKTOTAL, CKMB, CKMBINDEX, TROPONINI in the last 168 hours.  BNP (last 3 results) No results for input(s): BNP in the last 8760 hours.  ProBNP (last 3 results) No results for input(s): PROBNP in the last 8760 hours.  Radiological Exams: No results found.  Assessment/Plan Active Problems:   Acute on chronic respiratory failure with hypoxia (HCC)   Chronic atrial fibrillation (HCC)   COVID-19 virus infection   Severe sepsis (HCC)   Multifocal pneumonia   Metabolic encephalopathy   1. Acute on chronic respiratory failure hypoxia we will continue with T collar titrate oxygen continue pulmonary toilet.  ABG results are borderline as noted above with a pH of 7.34 PCO2 was at 70 will need to continue to monitor this closely and if necessary placed back on the ventilator 2. Chronic atrial fibrillation rate controlled 3. COVID-19 virus infection resolving 4. Severe sepsis resolved 5. Multifocal pneumonia treated ID following 6. Metabolic encephalopathy showing slow improvement   I have personally seen and evaluated the patient, evaluated laboratory and imaging results, formulated the assessment and plan and placed orders. The Patient requires high complexity decision making with multiple systems involvement.  Rounds were done with the Respiratory Therapy Director and Staff therapists and discussed with nursing staff also.  Yevonne Pax,  MD [facility] Pulmonary Critical Care Medicine Sleep Medicine

## 2019-07-23 ENCOUNTER — Other Ambulatory Visit (HOSPITAL_COMMUNITY): Payer: Medicare Other

## 2019-07-23 DIAGNOSIS — G9341 Metabolic encephalopathy: Secondary | ICD-10-CM | POA: Diagnosis not present

## 2019-07-23 DIAGNOSIS — J9621 Acute and chronic respiratory failure with hypoxia: Secondary | ICD-10-CM | POA: Diagnosis not present

## 2019-07-23 DIAGNOSIS — U071 COVID-19: Secondary | ICD-10-CM | POA: Diagnosis not present

## 2019-07-23 DIAGNOSIS — I482 Chronic atrial fibrillation, unspecified: Secondary | ICD-10-CM | POA: Diagnosis not present

## 2019-07-23 LAB — BASIC METABOLIC PANEL
Anion gap: 8 (ref 5–15)
BUN: 91 mg/dL — ABNORMAL HIGH (ref 8–23)
CO2: 37 mmol/L — ABNORMAL HIGH (ref 22–32)
Calcium: 9 mg/dL (ref 8.9–10.3)
Chloride: 99 mmol/L (ref 98–111)
Creatinine, Ser: 1.43 mg/dL — ABNORMAL HIGH (ref 0.61–1.24)
GFR calc Af Amer: 56 mL/min — ABNORMAL LOW (ref >60)
GFR calc non Af Amer: 48 mL/min — ABNORMAL LOW (ref >60)
Glucose, Bld: 94 mg/dL (ref 70–99)
Potassium: 5.9 mmol/L — ABNORMAL HIGH (ref 3.5–5.1)
Sodium: 144 mmol/L (ref 135–145)

## 2019-07-23 LAB — CBC
HCT: 25.3 % — ABNORMAL LOW (ref 39.0–52.0)
Hemoglobin: 7.3 g/dL — ABNORMAL LOW (ref 13.0–17.0)
MCH: 31.3 pg (ref 26.0–34.0)
MCHC: 28.9 g/dL — ABNORMAL LOW (ref 30.0–36.0)
MCV: 108.6 fL — ABNORMAL HIGH (ref 80.0–100.0)
Platelets: 306 10*3/uL (ref 150–400)
RBC: 2.33 MIL/uL — ABNORMAL LOW (ref 4.22–5.81)
RDW: 21.6 % — ABNORMAL HIGH (ref 11.5–15.5)
WBC: 12.8 10*3/uL — ABNORMAL HIGH (ref 4.0–10.5)
nRBC: 1.9 % — ABNORMAL HIGH (ref 0.0–0.2)

## 2019-07-23 LAB — TSH: TSH: 3.22 u[IU]/mL (ref 0.350–4.500)

## 2019-07-23 LAB — MAGNESIUM: Magnesium: 2.3 mg/dL (ref 1.7–2.4)

## 2019-07-23 LAB — T4, FREE: Free T4: 1 ng/dL (ref 0.61–1.12)

## 2019-07-23 NOTE — Progress Notes (Addendum)
Pulmonary Critical Care Medicine [facility] [place]   PULMONARY CRITICAL CARE SERVICE  PROGRESS NOTE  Date of Service: 07/23/2019  Henry Mcbride  [id number]  DOB: [birth year]   DOA: 06/23/2019  Referring Physician: Carron Curie, MD  HPI: Henry Mcbride is a 74 y.o. male seen for follow up of Acute on Chronic Respiratory Failure.  Patient is on full support on the ventilator at this time apparently had an episode where he coded early this morning.  Currently resting comfortably.  Medications: Reviewed on Rounds  Physical Exam:  Vitals: Pulse 81 respirations 48 BP 118/59 O2 sat 94% temp 99 point  Ventilator Settings ventilator mode AC VC rate of 12 tidal volume 450 PEEP of 5 FiO2 35%  . General: Comfortable at this time . Eyes: Grossly normal lids, irises & conjunctiva . ENT: grossly tongue is normal . Neck: no obvious mass . Cardiovascular: S1 S2 normal no gallop . Respiratory: No rales or rhonchi noted . Abdomen: soft . Skin: no rash seen on limited exam . Musculoskeletal: not rigid . Psychiatric:unable to assess . Neurologic: no seizure no involuntary movements         Lab Data:   Basic Metabolic Panel: Recent Labs  Lab 07/17/19 0610 07/18/19 0522 07/18/19 1822 07/22/19 0737 07/23/19 0525 07/23/19 1425  NA 144 140  --  148* 144  --   K 3.9 5.5* 3.7 5.0 5.9*  --   CL 107 106  --  105 99  --   CO2 29 31  --  38* 37*  --   GLUCOSE 109* 181*  --  100* 94  --   BUN 59* 65*  --  79* 91*  --   CREATININE 1.58* 1.48*  --  1.20 1.43*  --   CALCIUM 8.4* 8.8*  --  9.3 9.0  --   MG  --   --   --   --   --  2.3    ABG: Recent Labs  Lab 07/22/19 1740  PHART 7.335*  PCO2ART 70.6*  PO2ART 52.1*  HCO3 36.7*  O2SAT 89.3    Liver Function Tests: No results for input(s): AST, ALT, ALKPHOS, BILITOT, PROT, ALBUMIN in the last 168 hours. No results for input(s): LIPASE, AMYLASE in the last 168 hours. No results for input(s): AMMONIA in the last 168  hours.  CBC: Recent Labs  Lab 07/17/19 0610 07/18/19 0522 07/22/19 0737 07/23/19 0525  WBC 10.9* 10.5 12.4* 12.8*  HGB 7.1* 8.3* 8.1* 7.3*  HCT 24.2* 28.9* 28.8* 25.3*  MCV 104.3* 105.1* 109.9* 108.6*  PLT 335 339 338 306    Cardiac Enzymes: No results for input(s): CKTOTAL, CKMB, CKMBINDEX, TROPONINI in the last 168 hours.  BNP (last 3 results) No results for input(s): BNP in the last 8760 hours.  ProBNP (last 3 results) No results for input(s): PROBNP in the last 8760 hours.  Radiological Exams: DG Chest Port 1 View  Result Date: 07/23/2019 CLINICAL DATA:  Pneumonia EXAM: PORTABLE CHEST 1 VIEW COMPARISON:  Six days ago FINDINGS: Asymmetric interstitial airspace opacity on the right. There is also reticular density in the left upper lobe with volume loss. Pleural effusions on both sides. Normal heart size. Defibrillator from the left. Tracheostomy tube in good position. IMPRESSION: Stable asymmetric right infiltrate and pleural effusions. Electronically Signed   By: Marnee Spring M.D.   On: 07/23/2019 07:05    Assessment/Plan Active Problems:   Acute on chronic respiratory failure with hypoxia (HCC)   Chronic atrial  fibrillation ([place])   COVID-19 virus infection   Severe sepsis ([place])   Multifocal pneumonia   Metabolic encephalopathy   1. Acute on chronic respiratory failure hypoxia patient continue full support ventilator at this time currently doing well despite his episode of cutting his morning.  Continue aggressive pulmonary toilet supportive measures. 2. Chronic atrial fibrillation rate controlled 3. COVID-19 virus infection resolving 4. Severe sepsis resolved 5. Multifocal pneumonia treated ID following 6. Metabolic encephalopathy showing slow improvement   I have personally seen and evaluated the patient, evaluated laboratory and imaging results, formulated the assessment and plan and placed orders. The Patient requires high complexity decision making with  multiple systems involvement.  Rounds were done with the Respiratory Therapy Director and Staff therapists and discussed with nursing staff also.  Allyne Gee, MD [facility] Pulmonary Critical Care Medicine Sleep Medicine

## 2019-07-24 ENCOUNTER — Other Ambulatory Visit (HOSPITAL_COMMUNITY): Payer: Medicare Other

## 2019-07-24 DIAGNOSIS — J9621 Acute and chronic respiratory failure with hypoxia: Secondary | ICD-10-CM | POA: Diagnosis not present

## 2019-07-24 DIAGNOSIS — I482 Chronic atrial fibrillation, unspecified: Secondary | ICD-10-CM | POA: Diagnosis not present

## 2019-07-24 DIAGNOSIS — G9341 Metabolic encephalopathy: Secondary | ICD-10-CM | POA: Diagnosis not present

## 2019-07-24 DIAGNOSIS — U071 COVID-19: Secondary | ICD-10-CM | POA: Diagnosis not present

## 2019-07-24 LAB — CBC
HCT: 25.3 % — ABNORMAL LOW (ref 39.0–52.0)
Hemoglobin: 7.4 g/dL — ABNORMAL LOW (ref 13.0–17.0)
MCH: 30.8 pg (ref 26.0–34.0)
MCHC: 29.2 g/dL — ABNORMAL LOW (ref 30.0–36.0)
MCV: 105.4 fL — ABNORMAL HIGH (ref 80.0–100.0)
Platelets: 277 10*3/uL (ref 150–400)
RBC: 2.4 MIL/uL — ABNORMAL LOW (ref 4.22–5.81)
RDW: 22.5 % — ABNORMAL HIGH (ref 11.5–15.5)
WBC: 13.9 10*3/uL — ABNORMAL HIGH (ref 4.0–10.5)
nRBC: 0.7 % — ABNORMAL HIGH (ref 0.0–0.2)

## 2019-07-24 LAB — BASIC METABOLIC PANEL
Anion gap: 9 (ref 5–15)
BUN: 97 mg/dL — ABNORMAL HIGH (ref 8–23)
CO2: 35 mmol/L — ABNORMAL HIGH (ref 22–32)
Calcium: 8.8 mg/dL — ABNORMAL LOW (ref 8.9–10.3)
Chloride: 97 mmol/L — ABNORMAL LOW (ref 98–111)
Creatinine, Ser: 1.7 mg/dL — ABNORMAL HIGH (ref 0.61–1.24)
GFR calc Af Amer: 45 mL/min — ABNORMAL LOW (ref >60)
GFR calc non Af Amer: 39 mL/min — ABNORMAL LOW (ref >60)
Glucose, Bld: 106 mg/dL — ABNORMAL HIGH (ref 70–99)
Potassium: 5.4 mmol/L — ABNORMAL HIGH (ref 3.5–5.1)
Sodium: 141 mmol/L (ref 135–145)

## 2019-07-24 MED FILL — Medication: Qty: 1 | Status: AC

## 2019-07-24 NOTE — Progress Notes (Addendum)
Pulmonary Critical Care Medicine [place]   PULMONARY CRITICAL CARE SERVICE  PROGRESS NOTE  Date of Service: 07/24/2019  Henry Mcbride  HWE:[id number]  DOB: 1945/07/08   DOA: 06/23/2019  Referring Physician: Merton Border, MD  HPI: Henry Mcbride is a 74 y.o. male seen for follow up of Acute on Chronic Respiratory Failure.  Patient mains on full support on the ventilator at this time assist-control mode rate of 20 with an FiO2 of 35% satting well no fever or distress.  Medications: Reviewed on Rounds  Physical Exam:  Vitals: Pulse 70 respirations 25 BP 99/55 O2 sat 100% temp 97 point  Ventilator Settings ventilator mode AC VC rate of 20 tidal volume 450 PEEP of 5 and FiO2 of 35%  . General: Comfortable at this time . Eyes: Grossly normal lids, irises & conjunctiva . ENT: grossly tongue is normal . Neck: no obvious mass . Cardiovascular: S1 S2 normal no gallop . Respiratory: No rales or rhonchi noted . Abdomen: soft . Skin: no rash seen on limited exam . Musculoskeletal: not rigid . Psychiatric:unable to assess . Neurologic: no seizure no involuntary movements         Lab Data:   Basic Metabolic Panel: Recent Labs  Lab 07/18/19 0522 07/18/19 1822 07/22/19 0737 07/23/19 0525 07/23/19 1425 07/24/19 0430  NA 140  --  148* 144  --  141  K 5.5* 3.7 5.0 5.9*  --  5.4*  CL 106  --  105 99  --  97*  CO2 31  --  38* 37*  --  35*  GLUCOSE 181*  --  100* 94  --  106*  BUN 65*  --  79* 91*  --  97*  CREATININE 1.48*  --  1.20 1.43*  --  1.70*  CALCIUM 8.8*  --  9.3 9.0  --  8.8*  MG  --   --   --   --  2.3  --     ABG: Recent Labs  Lab 07/22/19 1740  PHART 7.335*  PCO2ART 70.6*  PO2ART 52.1*  HCO3 36.7*  O2SAT 89.3    Liver Function Tests: No results for input(s): AST, ALT, ALKPHOS, BILITOT, PROT, ALBUMIN in the last 168 hours. No results for input(s): LIPASE, AMYLASE in the last 168 hours. No results for input(s): AMMONIA in the last 168  hours.  CBC: Recent Labs  Lab 07/18/19 0522 07/22/19 0737 07/23/19 0525 07/24/19 0430  WBC 10.5 12.4* 12.8* 13.9*  HGB 8.3* 8.1* 7.3* 7.4*  HCT 28.9* 28.8* 25.3* 25.3*  MCV 105.1* 109.9* 108.6* 105.4*  PLT 339 338 306 277    Cardiac Enzymes: No results for input(s): CKTOTAL, CKMB, CKMBINDEX, TROPONINI in the last 168 hours.  BNP (last 3 results) No results for input(s): BNP in the last 8760 hours.  ProBNP (last 3 results) No results for input(s): PROBNP in the last 8760 hours.  Radiological Exams: DG Chest Port 1 View  Result Date: 07/24/2019 CLINICAL DATA:  Respiratory failure EXAM: PORTABLE CHEST 1 VIEW COMPARISON:  Yesterday FINDINGS: Dual-chamber pacer leads from the left. Right PICC with tip at the SVC. Tracheostomy tube in place. Diffuse interstitial opacity with pleural effusions and cardiomegaly. No pneumothorax. IMPRESSION: Stable hardware positioning, pulmonary infiltrates, and pleural effusions. Electronically Signed   By: Monte Fantasia M.D.   On: 07/24/2019 06:31   DG CHEST PORT 1 VIEW  Result Date: 07/23/2019 CLINICAL DATA:  PICC line placement. EXAM: PORTABLE CHEST 1 VIEW COMPARISON:  Radiographs 07/23/2019  and 07/17/2019.  CT 07/07/2019. FINDINGS: 1740 hours. New right arm PICC extends to the level of the superior cavoatrial junction. Tracheostomy and left subclavian AICD leads are unchanged. The heart size and mediastinal contours are stable. Bilateral airspace opacities and pleural effusions are unchanged. There is no pneumothorax. Postsurgical changes are partially imaged within the lower cervical spine. IMPRESSION: 1. Right arm PICC tip at the level of the superior cavoatrial junction. No pneumothorax. 2. Stable bilateral airspace opacities and pleural effusions. Electronically Signed   By: Carey Bullocks M.D.   On: 07/23/2019 18:09   DG Chest Port 1 View  Result Date: 07/23/2019 CLINICAL DATA:  Pneumonia EXAM: PORTABLE CHEST 1 VIEW COMPARISON:  Six days ago  FINDINGS: Asymmetric interstitial airspace opacity on the right. There is also reticular density in the left upper lobe with volume loss. Pleural effusions on both sides. Normal heart size. Defibrillator from the left. Tracheostomy tube in good position. IMPRESSION: Stable asymmetric right infiltrate and pleural effusions. Electronically Signed   By: Marnee Spring M.D.   On: 07/23/2019 07:05    Assessment/Plan Active Problems:   Acute on chronic respiratory failure with hypoxia (HCC)   Chronic atrial fibrillation (HCC)   COVID-19 virus infection   Severe sepsis (HCC)   Multifocal pneumonia   Metabolic encephalopathy   1. Acute on chronic respiratory failure hypoxia  continue on full support on the ventilator at this time will continue aggressive pulmonary toilet supportive measures. 2. Chronic atrial fibrillation rate controlled 3. COVID-19 virus infection resolving 4. Severe sepsis resolved 5. Multifocal pneumonia treated ID following 6. Metabolic encephalopathy showing slow improvement   I have personally seen and evaluated the patient, evaluated laboratory and imaging results, formulated the assessment and plan and placed orders. The Patient requires high complexity decision making with multiple systems involvement.  Rounds were done with the Respiratory Therapy Director and Staff therapists and discussed with nursing staff also.  Yevonne Pax, MD [facility] Pulmonary Critical Care Medicine Sleep Medicine

## 2019-07-25 ENCOUNTER — Other Ambulatory Visit (HOSPITAL_COMMUNITY): Payer: Medicare Other

## 2019-07-25 DIAGNOSIS — U071 COVID-19: Secondary | ICD-10-CM | POA: Diagnosis not present

## 2019-07-25 DIAGNOSIS — I482 Chronic atrial fibrillation, unspecified: Secondary | ICD-10-CM | POA: Diagnosis not present

## 2019-07-25 DIAGNOSIS — J9621 Acute and chronic respiratory failure with hypoxia: Secondary | ICD-10-CM | POA: Diagnosis not present

## 2019-07-25 DIAGNOSIS — G9341 Metabolic encephalopathy: Secondary | ICD-10-CM | POA: Diagnosis not present

## 2019-07-25 LAB — BASIC METABOLIC PANEL
Anion gap: 6 (ref 5–15)
BUN: 84 mg/dL — ABNORMAL HIGH (ref 8–23)
CO2: 37 mmol/L — ABNORMAL HIGH (ref 22–32)
Calcium: 8.2 mg/dL — ABNORMAL LOW (ref 8.9–10.3)
Chloride: 96 mmol/L — ABNORMAL LOW (ref 98–111)
Creatinine, Ser: 1.74 mg/dL — ABNORMAL HIGH (ref 0.61–1.24)
GFR calc Af Amer: 44 mL/min — ABNORMAL LOW (ref >60)
GFR calc non Af Amer: 38 mL/min — ABNORMAL LOW (ref >60)
Glucose, Bld: 154 mg/dL — ABNORMAL HIGH (ref 70–99)
Potassium: 4.9 mmol/L (ref 3.5–5.1)
Sodium: 139 mmol/L (ref 135–145)

## 2019-07-25 LAB — CULTURE, RESPIRATORY W GRAM STAIN

## 2019-07-25 NOTE — Progress Notes (Addendum)
Pulmonary Critical Care Medicine [facility] [place]   PULMONARY CRITICAL CARE SERVICE  PROGRESS NOTE  Date of Service: 07/25/2019  Jaykwon Morones  SHF:[id number]  DOB: [birth year]   DOA: 06/23/2019  Referring Physician: Carron Curie, MD  HPI: Henry Mcbride is a 74 y.o. male seen for follow up of Acute on Chronic Respiratory Failure.  Patient remains on full support ventilator at this time assist-control mode rate 20 with an FiO2 of 35% satting well no distress.  Medications: Reviewed on Rounds  Physical Exam:  Vitals: Pulse 70 respirations 30 BP 112/65 O2 sat 100% temp 96.7  Ventilator Settings ventilator mode AC VC rate of 20 tidal volume 450 PEEP of 5 and FiO2 35%  . General: Comfortable at this time . Eyes: Grossly normal lids, irises & conjunctiva . ENT: grossly tongue is normal . Neck: no obvious mass . Cardiovascular: S1 S2 normal no gallop . Respiratory: No rales or rhonchi noted . Abdomen: soft . Skin: no rash seen on limited exam . Musculoskeletal: not rigid . Psychiatric:unable to assess . Neurologic: no seizure no involuntary movements         Lab Data:   Basic Metabolic Panel: Recent Labs  Lab 07/18/19 1822 07/22/19 0737 07/23/19 0525 07/23/19 1425 07/24/19 0430 07/25/19 0550  NA  --  148* 144  --  141 139  K 3.7 5.0 5.9*  --  5.4* 4.9  CL  --  105 99  --  97* 96*  CO2  --  38* 37*  --  35* 37*  GLUCOSE  --  100* 94  --  106* 154*  BUN  --  79* 91*  --  97* 84*  CREATININE  --  1.20 1.43*  --  1.70* 1.74*  CALCIUM  --  9.3 9.0  --  8.8* 8.2*  MG  --   --   --  2.3  --   --     ABG: Recent Labs  Lab 07/22/19 1740  PHART 7.335*  PCO2ART 70.6*  PO2ART 52.1*  HCO3 36.7*  O2SAT 89.3    Liver Function Tests: No results for input(s): AST, ALT, ALKPHOS, BILITOT, PROT, ALBUMIN in the last 168 hours. No results for input(s): LIPASE, AMYLASE in the last 168 hours. No results for input(s): AMMONIA in the last 168 hours.  CBC: Recent  Labs  Lab 07/22/19 0737 07/23/19 0525 07/24/19 0430  WBC 12.4* 12.8* 13.9*  HGB 8.1* 7.3* 7.4*  HCT 28.8* 25.3* 25.3*  MCV 109.9* 108.6* 105.4*  PLT 338 306 277    Cardiac Enzymes: No results for input(s): CKTOTAL, CKMB, CKMBINDEX, TROPONINI in the last 168 hours.  BNP (last 3 results) No results for input(s): BNP in the last 8760 hours.  ProBNP (last 3 results) No results for input(s): PROBNP in the last 8760 hours.  Radiological Exams: DG Chest Port 1 View  Result Date: 07/24/2019 CLINICAL DATA:  Respiratory failure EXAM: PORTABLE CHEST 1 VIEW COMPARISON:  Yesterday FINDINGS: Dual-chamber pacer leads from the left. Right PICC with tip at the SVC. Tracheostomy tube in place. Diffuse interstitial opacity with pleural effusions and cardiomegaly. No pneumothorax. IMPRESSION: Stable hardware positioning, pulmonary infiltrates, and pleural effusions. Electronically Signed   By: Marnee Spring M.D.   On: 07/24/2019 06:31   DG CHEST PORT 1 VIEW  Result Date: 07/23/2019 CLINICAL DATA:  PICC line placement. EXAM: PORTABLE CHEST 1 VIEW COMPARISON:  Radiographs 07/23/2019 and 07/17/2019.  CT 07/07/2019. FINDINGS: 1740 hours. New right arm PICC extends to  the level of the superior cavoatrial junction. Tracheostomy and left subclavian AICD leads are unchanged. The heart size and mediastinal contours are stable. Bilateral airspace opacities and pleural effusions are unchanged. There is no pneumothorax. Postsurgical changes are partially imaged within the lower cervical spine. IMPRESSION: 1. Right arm PICC tip at the level of the superior cavoatrial junction. No pneumothorax. 2. Stable bilateral airspace opacities and pleural effusions. Electronically Signed   By: Richardean Sale M.D.   On: 07/23/2019 18:09    Assessment/Plan Active Problems:   Acute on chronic respiratory failure with hypoxia (HCC)   Chronic atrial fibrillation ([place])   COVID-19 virus infection   Severe sepsis (HCC)    Multifocal pneumonia   Metabolic encephalopathy   1. Acute on chronic respiratory failure hypoxia continue on full support on the ventilator at this time will continue aggressive pulmonary toilet supportive measures. 2. Chronic atrial fibrillation rate controlled 3. COVID-19 virus infection resolving 4. Severe sepsis resolved 5. Multifocal pneumonia treated ID following 6. Metabolic encephalopathy showing slow improvement   I have personally seen and evaluated the patient, evaluated laboratory and imaging results, formulated the assessment and plan and placed orders. The Patient requires high complexity decision making with multiple systems involvement.  Rounds were done with the Respiratory Therapy Director and Staff therapists and discussed with nursing staff also.  Allyne Gee, MD [facility] Pulmonary Critical Care Medicine Sleep Medicine

## 2019-07-26 ENCOUNTER — Other Ambulatory Visit (HOSPITAL_COMMUNITY): Payer: Medicare Other

## 2019-07-26 DIAGNOSIS — J9621 Acute and chronic respiratory failure with hypoxia: Secondary | ICD-10-CM | POA: Diagnosis not present

## 2019-07-26 DIAGNOSIS — G9341 Metabolic encephalopathy: Secondary | ICD-10-CM | POA: Diagnosis not present

## 2019-07-26 DIAGNOSIS — I482 Chronic atrial fibrillation, unspecified: Secondary | ICD-10-CM | POA: Diagnosis not present

## 2019-07-26 DIAGNOSIS — U071 COVID-19: Secondary | ICD-10-CM | POA: Diagnosis not present

## 2019-07-26 MED ORDER — PERFLUTREN LIPID MICROSPHERE
1.0000 mL | INTRAVENOUS | Status: AC | PRN
  Administered 2019-07-26: 2 mL via INTRAVENOUS

## 2019-07-26 NOTE — Progress Notes (Signed)
Pulmonary Critical Care Medicine [facility] [place]   PULMONARY CRITICAL CARE SERVICE  PROGRESS NOTE  Date of Service: 07/26/2019  Deniro Laymon  VVO:[id number]  DOB: 1945-10-19   DOA: 06/23/2019  Referring Physician: Carron Curie, MD  HPI: Trevyn Lumpkin is a 74 y.o. male seen for follow up of Acute on Chronic Respiratory Failure.  Patient currently is on full support on the ventilator has not been tolerating weaning.  Medications: Reviewed on Rounds  Physical Exam:  Vitals: Temperature 97.6 pulse 85 respiratory rate 22 blood pressure is 109/62 saturations 100%  Ventilator Settings on assist control FiO2 35% tidal volume 452 PEEP 5  . General: Comfortable at this time . Eyes: Grossly normal lids, irises & conjunctiva . ENT: grossly tongue is normal . Neck: no obvious mass . Cardiovascular: S1 S2 normal no gallop . Respiratory: No rhonchi no rales . Abdomen: soft . Skin: no rash seen on limited exam . Musculoskeletal: not rigid . Psychiatric:unable to assess . Neurologic: no seizure no involuntary movements         Lab Data:   Basic Metabolic Panel: Recent Labs  Lab 07/22/19 0737 07/23/19 0525 07/23/19 1425 07/24/19 0430 07/25/19 0550  NA 148* 144  --  141 139  K 5.0 5.9*  --  5.4* 4.9  CL 105 99  --  97* 96*  CO2 38* 37*  --  35* 37*  GLUCOSE 100* 94  --  106* 154*  BUN 79* 91*  --  97* 84*  CREATININE 1.20 1.43*  --  1.70* 1.74*  CALCIUM 9.3 9.0  --  8.8* 8.2*  MG  --   --  2.3  --   --     ABG: Recent Labs  Lab 07/22/19 1740  PHART 7.335*  PCO2ART 70.6*  PO2ART 52.1*  HCO3 36.7*  O2SAT 89.3    Liver Function Tests: No results for input(s): AST, ALT, ALKPHOS, BILITOT, PROT, ALBUMIN in the last 168 hours. No results for input(s): LIPASE, AMYLASE in the last 168 hours. No results for input(s): AMMONIA in the last 168 hours.  CBC: Recent Labs  Lab 07/22/19 0737 07/23/19 0525 07/24/19 0430  WBC 12.4* 12.8* 13.9*  HGB 8.1* 7.3*  7.4*  HCT 28.8* 25.3* 25.3*  MCV 109.9* 108.6* 105.4*  PLT 338 306 277    Cardiac Enzymes: No results for input(s): CKTOTAL, CKMB, CKMBINDEX, TROPONINI in the last 168 hours.  BNP (last 3 results) No results for input(s): BNP in the last 8760 hours.  ProBNP (last 3 results) No results for input(s): PROBNP in the last 8760 hours.  Radiological Exams: CT ABDOMEN PELVIS WO CONTRAST  Result Date: 07/26/2019 CLINICAL DATA:  Abdominal distension.  Bowel obstruction suspected. EXAM: CT ABDOMEN AND PELVIS WITHOUT CONTRAST TECHNIQUE: Multidetector CT imaging of the abdomen and pelvis was performed following the standard protocol without IV contrast. COMPARISON:  Radiograph yesterday.  CT 05/16/2019 FINDINGS: Lower chest: Moderate bilateral pleural effusions and compressive atelectasis. Pacemaker partially included. Mild cardiomegaly. Ground-glass opacity in the right middle lobe is partially included. Hepatobiliary: No evidence of focal hepatic abnormality allowing for lack of contrast. External artifact from overlying monitoring device partially obscures evaluation of the gallbladder. No pericholecystic inflammation. No biliary dilatation. Pancreas: No ductal dilatation or inflammation. Spleen: Normal in size without focal abnormality. Small amount of perisplenic free fluid in the left upper quadrant. Adrenals/Urinary Tract: Normal adrenal glands. No hydronephrosis. No renal calculi. Bilateral renal cysts which appears similar to prior exam. Urinary bladder is decompressed. Stomach/Bowel:  Gastrostomy tube appropriately positioned in the stomach. Stomach is fluid-filled. No obvious gastric wall thickening. Small bowel is diffusely dilated and fluid-filled. Small bowel are dilated to the ileocolic junction. No decompressed small bowel or transition point. Enteric sutures noted in the cecum. There is ascending colonic tortuosity. Small amount of stool and air within the ascending and transverse colon. More  distal colon is decompressed with small volume of stool. There is no colonic wall thickening or inflammatory change. No abnormal rectal distention. Vascular/Lymphatic: Aortic atherosclerosis. No aortic aneurysm. No bulky abdominopelvic adenopathy. Reproductive: Prostate is unremarkable. Other: Small volume of abdominal ascites, with more moderate ascites in pelvis. Fluid measures simple fluid density. There is no free air. Mild generalized subcutaneous fat stranding/edema. Musculoskeletal: Heterogeneous enlargement of right sartorius muscle suspicious for hematoma. Multilevel degenerative change in the spine. Sacral decubitus ulcer with air extending to bone. No obvious bony destructive change. No abscess. IMPRESSION: 1. Diffusely dilated and fluid-filled small bowel to the ileocolic junction, without decompressed small bowel or transition point. Findings favor generalized ileus rather than obstruction. 2. Small volume of abdominal ascites, with more moderate ascites in the pelvis. 3. Moderate bilateral pleural effusions and compressive atelectasis. Ground-glass opacity in the right middle lobe is partially included. 4. Sacral decubitus ulcer with air extending to bone. No bony destructive change or focal fluid collection. 5. Heterogeneous enlargement of right sartorius muscle suspicious for intramuscular hematoma. Aortic Atherosclerosis (ICD10-I70.0). Electronically Signed   By: Narda Rutherford M.D.   On: 07/26/2019 00:52   DG Abd Portable 1V  Result Date: 07/25/2019 CLINICAL DATA:  Abdominal distension. EXAM: PORTABLE ABDOMEN - 1 VIEW COMPARISON:  Radiograph 06/23/2019 FINDINGS: Diffuse gaseous distention of small bowel loops measuring up to 3.7 cm. Probable gastrostomy tube with tubing noted over the left abdomen. There is air in the right colon, with otherwise paucity of large bowel gas. No significant formed stool. No obvious free air on this supine view. Left pleural effusion is partially included.  IMPRESSION: Diffuse gaseous distention of small bowel loops measuring up to 3.7 cm, suspicious for small bowel obstruction. Possibility of ileus is also considered given air distended right colon, but obstruction is not excluded on radiograph. Electronically Signed   By: Narda Rutherford M.D.   On: 07/25/2019 22:26   ECHOCARDIOGRAM COMPLETE  Result Date: 07/26/2019    ECHOCARDIOGRAM REPORT   Patient Name:   BRONSON Soderberg Date of Exam: 07/26/2019 Medical Rec #:  [id number]      Height:       73.0 in Accession #:    [id number]     Weight:       218.0 lb Date of Birth:  08-27-1945      BSA:          2.232 m Patient Age:    74 years       BP:           118/59 mmHg Patient Gender: M              HR:           86 bpm. Exam Location:  Inpatient Procedure: 2D Echo and Intracardiac Opacification Agent Indications:     Cardiac arrest I46.9  History:         Patient has no prior history of Echocardiogram examinations.                  Risk Factors:Hypertension. Acute on chronic respiratory failure  with hypoxia                  Chronic atrial fibrillation                  COVID-19 virus infection                  Severe sepsis                  Multifocal pneumonia                  Metabolic encephalopathy                  Chronic kidney disease.  Sonographer:     Leta Jungling RDCS Referring Phys:  [id number] ALI HIJAZI Diagnosing Phys: Orpah Cobb MD  Sonographer Comments: Echo performed with patient supine and on artificial respirator. Patient become agitated very easily making imaging difficult. IMPRESSIONS  1. Left ventricular ejection fraction, by estimation, is 20 to 25%. The left ventricle has severely decreased function. The left ventricle demonstrates global hypokinesis. The left ventricular internal cavity size was mildly dilated. Left ventricular diastolic parameters are consistent with Grade I diastolic dysfunction (impaired relaxation).  2. Right ventricular systolic function is moderately reduced.  The right ventricular size is normal.  3. Left atrial size was moderately dilated.  4. Right atrial size was moderately dilated.  5. Large pleural effusion in the left lateral region.  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  7. Tricuspid valve regurgitation is mild to moderate.  8. The aortic valve is tricuspid. Aortic valve regurgitation is mild. Mild to moderate aortic valve sclerosis/calcification is present, without any evidence of aortic stenosis.  9. There is mild (Grade II) atheroma plaque involving the ascending aorta. 10. The inferior vena cava is dilated in size with <50% respiratory variability, suggesting right atrial pressure of 15 mmHg. FINDINGS  Left Ventricle: Left ventricular ejection fraction, by estimation, is 20 to 25%. The left ventricle has severely decreased function. The left ventricle demonstrates global hypokinesis. Definity contrast agent was given IV to delineate the left ventricular endocardial borders. The left ventricular internal cavity size was mildly dilated. There is no left ventricular hypertrophy. Left ventricular diastolic parameters are consistent with Grade I diastolic dysfunction (impaired relaxation). Right Ventricle: The right ventricular size is normal. No increase in right ventricular wall thickness. Right ventricular systolic function is moderately reduced. Left Atrium: Left atrial size was moderately dilated. Right Atrium: Right atrial size was moderately dilated. Pericardium: There is no evidence of pericardial effusion. Mitral Valve: The mitral valve is myxomatous. There is mild calcification of the mitral valve leaflet(s). Mildly decreased mobility of the mitral valve leaflets. Moderate mitral annular calcification. Moderate to severe mitral valve regurgitation. MV peak gradient, 2.2 mmHg. The mean mitral valve gradient is 1.0 mmHg. Tricuspid Valve: The tricuspid valve is normal in structure. Tricuspid valve regurgitation is  mild to moderate. Aortic Valve: The aortic valve is tricuspid. . There is mild thickening and mild calcification of the aortic valve. Aortic valve regurgitation is mild. Mild to moderate aortic valve sclerosis/calcification is present, without any evidence of aortic stenosis. There is mild thickening of the aortic valve. There is mild calcification of the aortic valve. Pulmonic Valve: The pulmonic valve was normal in structure. Pulmonic valve regurgitation is trivial. Aorta: The aortic root is normal in size and structure. There is mild (Grade II) atheroma plaque involving the ascending aorta. Venous: The inferior  vena cava is dilated in size with less than 50% respiratory variability, suggesting right atrial pressure of 15 mmHg. IAS/Shunts: No atrial level shunt detected by color flow Doppler. Additional Comments: There is a large pleural effusion in the left lateral region.  LEFT VENTRICLE PLAX 2D LVIDd:         6.30 cm      Diastology LVIDs:         5.70 cm      LV e' lateral:   10.20 cm/s LV PW:         0.60 cm      LV E/e' lateral: 7.2 LV IVS:        0.80 cm      LV e' medial:    5.17 cm/s LVOT diam:     2.30 cm      LV E/e' medial:  14.3 LV SV:         53 LV SV Index:   24 LVOT Area:     4.15 cm  LV Volumes (MOD) LV vol d, MOD A2C: 174.0 ml LV vol d, MOD A4C: 161.0 ml LV vol s, MOD A2C: 142.0 ml LV vol s, MOD A4C: 118.0 ml LV SV MOD A2C:     32.0 ml LV SV MOD A4C:     161.0 ml LV SV MOD BP:      38.5 ml RIGHT VENTRICLE RV S prime:     9.86 cm/s TAPSE (M-mode): 2.4 cm LEFT ATRIUM           Index LA diam:      4.20 cm 1.88 cm/m LA Vol (A2C): 42.4 ml 19.00 ml/m LA Vol (A4C): 61.9 ml 27.73 ml/m  AORTIC VALVE LVOT Vmax:   74.40 cm/s LVOT Vmean:  52.100 cm/s LVOT VTI:    0.128 m  AORTA Ao Root diam: 3.10 cm MITRAL VALVE               TRICUSPID VALVE MV Area (PHT): 5.97 cm    TR Peak grad:   21.0 mmHg MV Peak grad:  2.2 mmHg    TR Vmax:        229.00 cm/s MV Mean grad:  1.0 mmHg MV Vmax:       0.74 m/s    SHUNTS  MV Vmean:      45.0 cm/s   Systemic VTI:  0.13 m MV Decel Time: 127 msec    Systemic Diam: 2.30 cm MR Peak grad: 70.2 mmHg MR Mean grad: 42.0 mmHg MR Vmax:      419.00 cm/s MR Vmean:     305.0 cm/s MV E velocity: 73.70 cm/s MV A velocity: 81.00 cm/s MV E/A ratio:  0.91 Dixie Dials MD Electronically signed by Dixie Dials MD Signature Date/Time: 07/26/2019/3:58:54 PM    Final     Assessment/Plan Active Problems:   Acute on chronic respiratory failure with hypoxia (HCC)   Chronic atrial fibrillation (HCC)   COVID-19 virus infection   Severe sepsis (HCC)   Multifocal pneumonia   Metabolic encephalopathy   1. Acute on chronic respiratory failure hypoxia we will continue with assessing the wean readiness.  Advance wean as tolerated 2. Chronic atrial fibrillation rate controlled 3. COVID-19 virus infection treated resolved 4. Severe sepsis resolved we will continue to follow 5. Multifocal pneumonia clinically is improving 6. Metabolic encephalopathy no change we will monitor   I have personally seen and evaluated the patient, evaluated laboratory and imaging results, formulated the assessment and plan and placed  orders. The Patient requires high complexity decision making with multiple systems involvement.  Rounds were done with the Respiratory Therapy Director and Staff therapists and discussed with nursing staff also.  Allyne Gee, MD [facility] Pulmonary Critical Care Medicine Sleep Medicine

## 2019-07-26 NOTE — Consult Note (Signed)
Ref: Bonnita Nasuti, MD   Subjective:  Awake. Not able to communicate. VS stable. Improved blood pressure to 120's/70's range. Echocardiogram shows dilated LV with 20-25 % Ef, moderate MR and mild TR and AI.Marland Kitchen  Objective:  Vital Signs in the last 24 hours:  P: 90, R: 28, BP: 124/68  Physical Exam: BP Readings from Last 1 Encounters:  10/08/18 117/77     Wt Readings from Last 1 Encounters:  10/08/18 98.9 kg    Weight change:  There is no height or weight on file to calculate BMI. HEENT: [place]/AT, Eyes-Brown, PERL, EOMI, Conjunctiva-Pale pink, Sclera-Non-icteric Neck: + JVD, No bruit, Trachea midline. Lungs:  Clearing, Bilateral. Cardiac: Irregular rhythm, normal S1 and S2, no S3. III/VI systolic murmur. Abdomen:  Soft, non-tender. BS present. Extremities:  No edema present. No cyanosis. No clubbing. CNS: AxOx0, moves all 4 extremities.  Skin: Warm and dry.   Intake/Output from previous day: No intake/output data recorded.    Lab Results: BMET    Component Value Date/Time   NA 139 07/25/2019 0550   NA 141 07/24/2019 0430   NA 144 07/23/2019 0525   K 4.9 07/25/2019 0550   K 5.4 (H) 07/24/2019 0430   K 5.9 (H) 07/23/2019 0525   CL 96 (L) 07/25/2019 0550   CL 97 (L) 07/24/2019 0430   CL 99 07/23/2019 0525   CO2 37 (H) 07/25/2019 0550   CO2 35 (H) 07/24/2019 0430   CO2 37 (H) 07/23/2019 0525   GLUCOSE 154 (H) 07/25/2019 0550   GLUCOSE 106 (H) 07/24/2019 0430   GLUCOSE 94 07/23/2019 0525   BUN 84 (H) 07/25/2019 0550   BUN 97 (H) 07/24/2019 0430   BUN 91 (H) 07/23/2019 0525   CREATININE 1.74 (H) 07/25/2019 0550   CREATININE 1.70 (H) 07/24/2019 0430   CREATININE 1.43 (H) 07/23/2019 0525   CALCIUM 8.2 (L) 07/25/2019 0550   CALCIUM 8.8 (L) 07/24/2019 0430   CALCIUM 9.0 07/23/2019 0525   GFRNONAA 38 (L) 07/25/2019 0550   GFRNONAA 39 (L) 07/24/2019 0430   GFRNONAA 48 (L) 07/23/2019 0525   GFRAA 44 (L) 07/25/2019 0550   GFRAA 45 (L) 07/24/2019 0430   GFRAA 56 (L)  07/23/2019 0525   CBC    Component Value Date/Time   WBC 13.9 (H) 07/24/2019 0430   RBC 2.40 (L) 07/24/2019 0430   HGB 7.4 (L) 07/24/2019 0430   HCT 25.3 (L) 07/24/2019 0430   PLT 277 07/24/2019 0430   MCV 105.4 (H) 07/24/2019 0430   MCH 30.8 07/24/2019 0430   MCHC 29.2 (L) 07/24/2019 0430   RDW 22.5 (H) 07/24/2019 0430   LYMPHSABS 1.4 06/24/2019 0533   MONOABS 1.1 (H) 06/24/2019 0533   EOSABS 0.5 06/24/2019 0533   BASOSABS 0.0 06/24/2019 0533   HEPATIC Function Panel Recent Labs    06/24/19 0533  PROT 5.7*   HEMOGLOBIN A1C No components found for: HGA1C,  MPG CARDIAC ENZYMES No results found for: CKTOTAL, CKMB, CKMBINDEX, TROPONINI BNP No results for input(s): PROBNP in the last 8760 hours. TSH Recent Labs    07/23/19 0525  TSH 3.220   CHOLESTEROL No results for input(s): CHOL in the last 8760 hours.  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 Decrease dopamine as tolerated from tomorrow if BP stabilizes. Prognosis remains poor.   LOS: 0 days   Time spent including chart review, lab review, examination, discussion with patient's family member, nurse and tech : 30 min   Orpah Cobb  MD  07/26/2019, 5:45 PM

## 2019-07-26 NOTE — Consult Note (Signed)
Referring Physician: Hijazi/Brown  Henry Mcbride is an 74 y.o. male.                       Chief Complaint: Cardiac arrest  HPI: 74 years old black male  With PMH of dilated non-ischemic cardiomyopathy, chronic atrial fibrillation, S/P COVID-19 pneumonia, chronic kidney disease, S/P ICD implant since 03/2015 had brief episode of cardiac arrest. He is on dopamine drip for low blood pressure. He is not able to communicate. He is back on ventilator.  Past Medical History:  Diagnosis Date  . Acute on chronic respiratory failure with hypoxia (HCC)   . Anxiety   . Arthritis   . CHF (congestive heart failure) (HCC)    dilated non-ischemic cardiomyopathy; followed at [facility]  . Chronic atrial fibrillation (HCC)   . CKD (chronic kidney disease)   . COVID-19 virus infection   . Cyst of kidney, acquired   . Depression   . Diabetes mellitus without complication (HCC)   . Hypertension   . Hypothyroidism   . Mental disorder   . Metabolic encephalopathy   . Multifocal pneumonia   . Schizo affective schizophrenia (HCC)   . Seizures (HCC)    last was 3-4 years ago  . Severe sepsis (HCC)   . Shortness of breath   . Tardive dyskinesia       Past Surgical History:  Procedure Laterality Date  . ANKLE ARTHROSCOPY  right  . COLON RESECTION  July 2014   had mass surgery in [place]  . COLONOSCOPY    . HERNIA REPAIR     times 2  . IR THORACENTESIS ASP PLEURAL SPACE W/IMG GUIDE  07/09/2019  . POSTERIOR CERVICAL FUSION/FORAMINOTOMY N/A 10/28/2012   Procedure: CERVICAL TWO THREE, CERVICAL THREE FOUR, CERVICAL FOUR FIVE, CERVICAL FIVE SIX, CERVICAL SIX SEVEN POSTERIOR CERVICAL FUSION WITH LATERAL MASS FIXATION;  Surgeon: Temple Pacini, MD;  Location: [facility];  Service: Neurosurgery;  Laterality: N/A;  POSTERIOR CERVICAL FUSION/FORAMINOTOMY LEVEL 5  . TRANSTHORACIC ECHOCARDIOGRAM  06/20/12   mild to moderate LV systolic dysfunction with akinesisa of basilar and mild  inferolateral segments, EF 45% (visual 40%), impaired relaxation, mildly dilated LA  . WRIST ARTHROPLASTY     at age 27    No family history on file. Social History:  reports that he has never smoked. He has never used smokeless tobacco. He reports that he does not drink alcohol and does not use drugs.  Allergies:  Allergies  Allergen Reactions  . Lithium Diarrhea and Other (See Comments)    Frequency in urination  Frequent urination    Medications Prior to Admission  Medication Sig Dispense Refill  . acetaminophen (TYLENOL) 325 MG tablet Take 650 mg by mouth every 6 (six) hours as needed for pain.    Marland Kitchen amiodarone (PACERONE) 200 MG tablet Take 200 mg by mouth.    Marland Kitchen aspirin 81 MG chewable tablet Chew 81 mg by mouth.    Marland Kitchen atorvastatin (LIPITOR) 40 MG tablet Take by mouth.    . benztropine (COGENTIN) 2 MG tablet Take 2 mg by mouth.    . carvedilol (COREG) 6.25 MG tablet Take 6.25 mg by mouth 2 (two) times daily with a meal.    . cyclobenzaprine (FLEXERIL) 10 MG tablet Take 1 tablet (10 mg total) by mouth 3 (three) times daily as needed for muscle spasms. 30 tablet 0  . divalproex (DEPAKOTE ER) 250 MG 24 hr tablet Take 1,750 mg by mouth  every evening.     . divalproex (DEPAKOTE) 500 MG DR tablet     . docusate sodium (COLACE) 100 MG capsule Take 100 mg by mouth.    . donepezil (ARICEPT) 10 MG tablet Take 10 mg by mouth at bedtime.     . dutasteride (AVODART) 0.5 MG capsule Take 0.5 mg by mouth daily.    . Emollient (MOISTURIZING LOTION) LOTN Apply Gold Bond lotion to feet nightly for dry skin.  Do not apply between the toes. 1 Bottle 1  . ergocalciferol (VITAMIN D2) 50000 UNITS capsule Take 50,000 Units by mouth once a week. Take 50,000 units on Fridays.    . ferrous sulfate 324 (65 Fe) MG TBEC Take 324 mg by mouth.    . finasteride (PROSCAR) 5 MG tablet Take 5 mg by mouth.    . furosemide (LASIX) 20 MG tablet Take 20 mg by mouth daily.    Marland Kitchen gabapentin (NEURONTIN) 100 MG capsule     .  ipratropium-albuterol (DUONEB) 0.5-2.5 (3) MG/3ML SOLN     . LANTUS 100 UNIT/ML injection     . levothyroxine (SYNTHROID, LEVOTHROID) 125 MCG tablet Take by mouth.    . levothyroxine (SYNTHROID, LEVOTHROID) 137 MCG tablet Take 137 mcg by mouth daily before breakfast.    . linagliptin (TRADJENTA) 5 MG TABS tablet Take 5 mg by mouth daily.    . magnesium oxide (MAG-OX) 400 MG tablet Take 400 mg by mouth.    . metoprolol succinate (TOPROL-XL) 25 MG 24 hr tablet Take 25 mg by mouth.    . OLANZapine zydis (ZYPREXA) 10 MG disintegrating tablet Take 10 mg by mouth at bedtime.    Marland Kitchen oxybutynin (DITROPAN) 5 MG tablet Take 5 mg by mouth 2 (two) times daily.    Marland Kitchen oxybutynin (DITROPAN-XL) 10 MG 24 hr tablet     . pantoprazole (PROTONIX) 20 MG tablet     . polyethylene glycol powder (GLYCOLAX/MIRALAX) powder Take 17 g by mouth daily. Mix 1 capful (17 grams) with 8 oz of water and drink by mouth every other day.    . potassium chloride (MICRO-K) 10 MEQ CR capsule     . potassium chloride SA (K-DUR,KLOR-CON) 20 MEQ tablet     . PRODIGY LANCETS 28G MISC     . ranitidine (ZANTAC) 150 MG tablet     . silver sulfADIAZINE (SILVADENE) 1 % cream Apply pea-sized amount to wound daily. 50 g 0  . solifenacin (VESICARE) 10 MG tablet Take 10 mg by mouth.    . tamsulosin (FLOMAX) 0.4 MG CAPS capsule Take 0.4 mg by mouth 2 (two) times daily.    Marland Kitchen torsemide (DEMADEX) 20 MG tablet Take 20 mg by mouth.      Results for orders placed or performed during the hospital encounter of 06/23/19 (from the past 48 hour(s))  Basic metabolic panel     Status: Abnormal   Collection Time: 07/25/19  5:50 AM  Result Value Ref Range   Sodium 139 135 - 145 mmol/L   Potassium 4.9 3.5 - 5.1 mmol/L   Chloride 96 (L) 98 - 111 mmol/L   CO2 37 (H) 22 - 32 mmol/L   Glucose, Bld 154 (H) 70 - 99 mg/dL    Comment: Glucose reference range applies only to samples taken after fasting for at least 8 hours.   BUN 84 (H) 8 - 23 mg/dL   Creatinine,  Ser 1.74 (H) 0.61 - 1.24 mg/dL   Calcium 8.2 (L) 8.9 - 10.3 mg/dL  GFR calc non Af Amer 38 (L) >60 mL/min   GFR calc Af Amer 44 (L) >60 mL/min   Anion gap 6 5 - 15    Comment: Performed at [facility], [address] [place], [place] [address]   CT ABDOMEN PELVIS WO CONTRAST  Result Date: 07/26/2019 CLINICAL DATA:  Abdominal distension.  Bowel obstruction suspected. EXAM: CT ABDOMEN AND PELVIS WITHOUT CONTRAST TECHNIQUE: Multidetector CT imaging of the abdomen and pelvis was performed following the standard protocol without IV contrast. COMPARISON:  Radiograph yesterday.  CT 05/16/2019 FINDINGS: Lower chest: Moderate bilateral pleural effusions and compressive atelectasis. Pacemaker partially included. Mild cardiomegaly. Ground-glass opacity in the right middle lobe is partially included. Hepatobiliary: No evidence of focal hepatic abnormality allowing for lack of contrast. External artifact from overlying monitoring device partially obscures evaluation of the gallbladder. No pericholecystic inflammation. No biliary dilatation. Pancreas: No ductal dilatation or inflammation. Spleen: Normal in size without focal abnormality. Small amount of perisplenic free fluid in the left upper quadrant. Adrenals/Urinary Tract: Normal adrenal glands. No hydronephrosis. No renal calculi. Bilateral renal cysts which appears similar to prior exam. Urinary bladder is decompressed. Stomach/Bowel: Gastrostomy tube appropriately positioned in the stomach. Stomach is fluid-filled. No obvious gastric wall thickening. Small bowel is diffusely dilated and fluid-filled. Small bowel are dilated to the ileocolic junction. No decompressed small bowel or transition point. Enteric sutures noted in the cecum. There is ascending colonic tortuosity. Small amount of stool and air within the ascending and transverse colon. More distal colon is decompressed with small volume of stool. There is no colonic wall thickening or inflammatory  change. No abnormal rectal distention. Vascular/Lymphatic: Aortic atherosclerosis. No aortic aneurysm. No bulky abdominopelvic adenopathy. Reproductive: Prostate is unremarkable. Other: Small volume of abdominal ascites, with more moderate ascites in pelvis. Fluid measures simple fluid density. There is no free air. Mild generalized subcutaneous fat stranding/edema. Musculoskeletal: Heterogeneous enlargement of right sartorius muscle suspicious for hematoma. Multilevel degenerative change in the spine. Sacral decubitus ulcer with air extending to bone. No obvious bony destructive change. No abscess. IMPRESSION: 1. Diffusely dilated and fluid-filled small bowel to the ileocolic junction, without decompressed small bowel or transition point. Findings favor generalized ileus rather than obstruction. 2. Small volume of abdominal ascites, with more moderate ascites in the pelvis. 3. Moderate bilateral pleural effusions and compressive atelectasis. Ground-glass opacity in the right middle lobe is partially included. 4. Sacral decubitus ulcer with air extending to bone. No bony destructive change or focal fluid collection. 5. Heterogeneous enlargement of right sartorius muscle suspicious for intramuscular hematoma. Aortic Atherosclerosis (ICD10-I70.0). Electronically Signed   By: Narda RutherfordMelanie  Sanford M.D.   On: 07/26/2019 00:52   DG Abd Portable 1V  Result Date: 07/25/2019 CLINICAL DATA:  Abdominal distension. EXAM: PORTABLE ABDOMEN - 1 VIEW COMPARISON:  Radiograph 06/23/2019 FINDINGS: Diffuse gaseous distention of small bowel loops measuring up to 3.7 cm. Probable gastrostomy tube with tubing noted over the left abdomen. There is air in the right colon, with otherwise paucity of large bowel gas. No significant formed stool. No obvious free air on this supine view. Left pleural effusion is partially included. IMPRESSION: Diffuse gaseous distention of small bowel loops measuring up to 3.7 cm, suspicious for small bowel  obstruction. Possibility of ileus is also considered given air distended right colon, but obstruction is not excluded on radiograph. Electronically Signed   By: Narda RutherfordMelanie  Sanford M.D.   On: 07/25/2019 22:26   ECHOCARDIOGRAM COMPLETE  Result Date: 07/26/2019    ECHOCARDIOGRAM  REPORT   Patient Name:   Henry Mcbride Date of Exam: 07/26/2019 Medical Rec #:  [id number]      Height:       73.0 in Accession #:    [id number]     Weight:       218.0 lb Date of Birth:  [birth year]      BSA:          2.232 m Patient Age:    74 years       BP:           118/59 mmHg Patient Gender: M              HR:           86 bpm. Exam Location:  Inpatient Procedure: 2D Echo and Intracardiac Opacification Agent Indications:     Cardiac arrest I46.9  History:         Patient has no prior history of Echocardiogram examinations.                  Risk Factors:Hypertension. Acute on chronic respiratory failure                  with hypoxia                  Chronic atrial fibrillation                  COVID-19 virus infection                  Severe sepsis                  Multifocal pneumonia                  Metabolic encephalopathy                  Chronic kidney disease.  Sonographer:     Leta Jungling RDCS Referring Phys:  [id number] ALI HIJAZI Diagnosing Phys: Orpah Cobb MD  Sonographer Comments: Echo performed with patient supine and on artificial respirator. Patient become agitated very easily making imaging difficult. IMPRESSIONS  1. Left ventricular ejection fraction, by estimation, is 20 to 25%. The left ventricle has severely decreased function. The left ventricle demonstrates global hypokinesis. The left ventricular internal cavity size was mildly dilated. Left ventricular diastolic parameters are consistent with Grade I diastolic dysfunction (impaired relaxation).  2. Right ventricular systolic function is moderately reduced. The right ventricular size is normal.  3. Left atrial size was moderately dilated.  4. Right atrial size was  moderately dilated.  5. Large pleural effusion in the left lateral region.  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  7. Tricuspid valve regurgitation is mild to moderate.  8. The aortic valve is tricuspid. Aortic valve regurgitation is mild. Mild to moderate aortic valve sclerosis/calcification is present, without any evidence of aortic stenosis.  9. There is mild (Grade II) atheroma plaque involving the ascending aorta. 10. The inferior vena cava is dilated in size with <50% respiratory variability, suggesting right atrial pressure of 15 mmHg. FINDINGS  Left Ventricle: Left ventricular ejection fraction, by estimation, is 20 to 25%. The left ventricle has severely decreased function. The left ventricle demonstrates global hypokinesis. Definity contrast agent was given IV to delineate the left ventricular endocardial borders. The left ventricular internal cavity size was mildly dilated. There is no left ventricular hypertrophy. Left ventricular diastolic parameters are consistent with Grade I  diastolic dysfunction (impaired relaxation). Right Ventricle: The right ventricular size is normal. No increase in right ventricular wall thickness. Right ventricular systolic function is moderately reduced. Left Atrium: Left atrial size was moderately dilated. Right Atrium: Right atrial size was moderately dilated. Pericardium: There is no evidence of pericardial effusion. Mitral Valve: The mitral valve is myxomatous. There is mild calcification of the mitral valve leaflet(s). Mildly decreased mobility of the mitral valve leaflets. Moderate mitral annular calcification. Moderate to severe mitral valve regurgitation. MV peak gradient, 2.2 mmHg. The mean mitral valve gradient is 1.0 mmHg. Tricuspid Valve: The tricuspid valve is normal in structure. Tricuspid valve regurgitation is mild to moderate. Aortic Valve: The aortic valve is tricuspid. . There is mild thickening and mild  calcification of the aortic valve. Aortic valve regurgitation is mild. Mild to moderate aortic valve sclerosis/calcification is present, without any evidence of aortic stenosis. There is mild thickening of the aortic valve. There is mild calcification of the aortic valve. Pulmonic Valve: The pulmonic valve was normal in structure. Pulmonic valve regurgitation is trivial. Aorta: The aortic root is normal in size and structure. There is mild (Grade II) atheroma plaque involving the ascending aorta. Venous: The inferior vena cava is dilated in size with less than 50% respiratory variability, suggesting right atrial pressure of 15 mmHg. IAS/Shunts: No atrial level shunt detected by color flow Doppler. Additional Comments: There is a large pleural effusion in the left lateral region.  LEFT VENTRICLE PLAX 2D LVIDd:         6.30 cm      Diastology LVIDs:         5.70 cm      LV e' lateral:   10.20 cm/s LV PW:         0.60 cm      LV E/e' lateral: 7.2 LV IVS:        0.80 cm      LV e' medial:    5.17 cm/s LVOT diam:     2.30 cm      LV E/e' medial:  14.3 LV SV:         53 LV SV Index:   24 LVOT Area:     4.15 cm  LV Volumes (MOD) LV vol d, MOD A2C: 174.0 ml LV vol d, MOD A4C: 161.0 ml LV vol s, MOD A2C: 142.0 ml LV vol s, MOD A4C: 118.0 ml LV SV MOD A2C:     32.0 ml LV SV MOD A4C:     161.0 ml LV SV MOD BP:      38.5 ml RIGHT VENTRICLE RV S prime:     9.86 cm/s TAPSE (M-mode): 2.4 cm LEFT ATRIUM           Index LA diam:      4.20 cm 1.88 cm/m LA Vol (A2C): 42.4 ml 19.00 ml/m LA Vol (A4C): 61.9 ml 27.73 ml/m  AORTIC VALVE LVOT Vmax:   74.40 cm/s LVOT Vmean:  52.100 cm/s LVOT VTI:    0.128 m  AORTA Ao Root diam: 3.10 cm MITRAL VALVE               TRICUSPID VALVE MV Area (PHT): 5.97 cm    TR Peak grad:   21.0 mmHg MV Peak grad:  2.2 mmHg    TR Vmax:        229.00 cm/s MV Mean grad:  1.0 mmHg MV Vmax:       0.74 m/s    SHUNTS MV Vmean:  45.0 cm/s   Systemic VTI:  0.13 m MV Decel Time: 127 msec    Systemic Diam: 2.30 cm  MR Peak grad: 70.2 mmHg MR Mean grad: 42.0 mmHg MR Vmax:      419.00 cm/s MR Vmean:     305.0 cm/s MV E velocity: 73.70 cm/s MV A velocity: 81.00 cm/s MV E/A ratio:  0.91 Orpah Cobb MD Electronically signed by Orpah Cobb MD Signature Date/Time: 07/26/2019/3:58:54 PM    Final     Review Of Systems As per HPI. Patient unable to give information.  There were no vitals taken for this visit. There is no height or weight on file to calculate BMI. General appearance: Sedated, on ventilator, appears stated age and moderate respiratory distress Head: Normocephalic, atraumatic. Eyes: Brown eyes, pink conjunctiva, corneas clear.  Neck: No adenopathy, no carotid bruit, positive JVD, supple, symmetrical, trachea midline and thyroid not enlarged. Resp: Rhonchi to auscultation bilaterally. Cardio: Irregular rate and rhythm, S1, S2 normal, II/VI systolic murmur, no click, rub or gallop GI: Soft, non-tender; bowel sounds normal; no organomegaly. Extremities: Trace edema, no cyanosis or clubbing. Skin: Warm and dry.  Neurologic: Alert and oriented X 0. Randomly moves all extremities.  Assessment/Plan Acute on chronic respiratory failure with hypoxia S/P COVID-19 pneumonia Metabolic encephalopathy Chronic atrial fibrillation S/P ICD Severe sepsis CKD, IIIa  Echocardiogram for LV function. Continue inotropic support.  Time spent: Review of old records, Lab, x-rays, EKG, other cardiac tests, examination, discussion with patient's doctor, nurse and tech over 70 minutes.  Ricki Rodriguez, MD  07/26/2019, 5:23 PM

## 2019-07-26 NOTE — Progress Notes (Signed)
  Echocardiogram 2D Echocardiogram with definity has been performed.  Leta Jungling M 07/26/2019, 11:07 AM

## 2019-07-27 DIAGNOSIS — U071 COVID-19: Secondary | ICD-10-CM | POA: Diagnosis not present

## 2019-07-27 DIAGNOSIS — J9621 Acute and chronic respiratory failure with hypoxia: Secondary | ICD-10-CM | POA: Diagnosis not present

## 2019-07-27 DIAGNOSIS — G9341 Metabolic encephalopathy: Secondary | ICD-10-CM | POA: Diagnosis not present

## 2019-07-27 DIAGNOSIS — I482 Chronic atrial fibrillation, unspecified: Secondary | ICD-10-CM | POA: Diagnosis not present

## 2019-07-27 LAB — BASIC METABOLIC PANEL
Anion gap: 7 (ref 5–15)
BUN: 84 mg/dL — ABNORMAL HIGH (ref 8–23)
CO2: 32 mmol/L (ref 22–32)
Calcium: 8.3 mg/dL — ABNORMAL LOW (ref 8.9–10.3)
Chloride: 100 mmol/L (ref 98–111)
Creatinine, Ser: 2.13 mg/dL — ABNORMAL HIGH (ref 0.61–1.24)
GFR calc Af Amer: 34 mL/min — ABNORMAL LOW (ref >60)
GFR calc non Af Amer: 30 mL/min — ABNORMAL LOW (ref >60)
Glucose, Bld: 97 mg/dL (ref 70–99)
Potassium: 5.5 mmol/L — ABNORMAL HIGH (ref 3.5–5.1)
Sodium: 139 mmol/L (ref 135–145)

## 2019-07-27 LAB — MAGNESIUM: Magnesium: 2.2 mg/dL (ref 1.7–2.4)

## 2019-07-27 NOTE — Consult Note (Addendum)
Ref: Galvin Proffer, MD   Subjective:  Awake but not communicative. VS stable with BP in 140's/70's range.  Objective:  Vital Signs in the last 24 hours:  P: 78, R: 24, BP: 140's/70's  Physical Exam: BP Readings from Last 1 Encounters:  10/08/18 117/77     Wt Readings from Last 1 Encounters:  10/08/18 98.9 kg    Weight change:  There is no height or weight on file to calculate BMI. HEENT: [place]/AT, Eyes-Brown, PERL, EOMI, Conjunctiva-Pale pink, Sclera-Non-icteric Neck: + JVD, No bruit, Trachea midline. Lungs:  Clearing, Bilateral. Cardiac:  Regular rhythm, normal S1 and S2, no S3. III/VI systolic murmur. Abdomen:  Soft, non-tender. BS present. Extremities:  No edema present. No cyanosis. No clubbing. CNS: AxOx0, Cranial nerves grossly intact, moves all 4 extremities.  Skin: Warm and dry.   Intake/Output from previous day: No intake/output data recorded.    Lab Results: BMET    Component Value Date/Time   NA 139 07/25/2019 0550   NA 141 07/24/2019 0430   NA 144 07/23/2019 0525   K 4.9 07/25/2019 0550   K 5.4 (H) 07/24/2019 0430   K 5.9 (H) 07/23/2019 0525   CL 96 (L) 07/25/2019 0550   CL 97 (L) 07/24/2019 0430   CL 99 07/23/2019 0525   CO2 37 (H) 07/25/2019 0550   CO2 35 (H) 07/24/2019 0430   CO2 37 (H) 07/23/2019 0525   GLUCOSE 154 (H) 07/25/2019 0550   GLUCOSE 106 (H) 07/24/2019 0430   GLUCOSE 94 07/23/2019 0525   BUN 84 (H) 07/25/2019 0550   BUN 97 (H) 07/24/2019 0430   BUN 91 (H) 07/23/2019 0525   CREATININE 1.74 (H) 07/25/2019 0550   CREATININE 1.70 (H) 07/24/2019 0430   CREATININE 1.43 (H) 07/23/2019 0525   CALCIUM 8.2 (L) 07/25/2019 0550   CALCIUM 8.8 (L) 07/24/2019 0430   CALCIUM 9.0 07/23/2019 0525   GFRNONAA 38 (L) 07/25/2019 0550   GFRNONAA 39 (L) 07/24/2019 0430   GFRNONAA 48 (L) 07/23/2019 0525   GFRAA 44 (L) 07/25/2019 0550   GFRAA 45 (L) 07/24/2019 0430   GFRAA 56 (L) 07/23/2019 0525   CBC    Component Value Date/Time   WBC 13.9 (H)  07/24/2019 0430   RBC 2.40 (L) 07/24/2019 0430   HGB 7.4 (L) 07/24/2019 0430   HCT 25.3 (L) 07/24/2019 0430   PLT 277 07/24/2019 0430   MCV 105.4 (H) 07/24/2019 0430   MCH 30.8 07/24/2019 0430   MCHC 29.2 (L) 07/24/2019 0430   RDW 22.5 (H) 07/24/2019 0430   LYMPHSABS 1.4 06/24/2019 0533   MONOABS 1.1 (H) 06/24/2019 0533   EOSABS 0.5 06/24/2019 0533   BASOSABS 0.0 06/24/2019 0533   HEPATIC Function Panel Recent Labs    06/24/19 0533  PROT 5.7*   HEMOGLOBIN A1C No components found for: HGA1C,  MPG CARDIAC ENZYMES No results found for: CKTOTAL, CKMB, CKMBINDEX, TROPONINI BNP No results for input(s): PROBNP in the last 8760 hours. TSH Recent Labs    07/23/19 0525  TSH 3.220   CHOLESTEROL No results for input(s): CHOL in the last 8760 hours.  Scheduled Meds: Continuous Infusions: PRN Meds:.lidocaine  Assessment/Plan: Acute on chronic respiratory failure with hypoxia Acute on chronic systolic left heart failure, HFrEF Dilated cardiomyopathy Moderate to severe MR Mild to moderate TR Mild aortic insufficiency S/P ICD S/P COVID-19 pneumonia S/P decubitus ulcer, stage III CKD, IIIa Chronic atrial fibrillation Metabolic encephalopathy Large left pleural effusion Small bowel ileus  Continue NPO. Continue IV  lanoxin. Decrease dopamine drip as tolerated keeping BP over 120/60.   LOS: 0 days   Time spent including chart review, lab review, examination, discussion with patient's nurse and tech : 30 min   Dixie Dials  MD  07/27/2019, 9:45 AM

## 2019-07-27 NOTE — Progress Notes (Signed)
Pulmonary Critical Care Medicine [facility] [place]   PULMONARY CRITICAL CARE SERVICE  PROGRESS NOTE  Date of Service: 07/27/2019  Henry Mcbride  SWN:[id number]  DOB: 24-Jan-1946   DOA: 06/23/2019  Referring Physician: Carron Curie, MD  HPI: Henry Mcbride is a 74 y.o. male seen for follow up of Acute on Chronic Respiratory Failure.  Patient currently is on full support on assist control mode has been on 35% FiO2 has not been tolerating weaning today has been having increased agitation and anxiety issues  Medications: Reviewed on Rounds  Physical Exam:  Vitals: Temperature is 98.4 pulse 81 respiratory rate 40 blood pressure is 110/59 saturations 98%  Ventilator Settings on assist control FiO2 is 35% tidal volume 440 PEEP 5  . General: Comfortable at this time . Eyes: Grossly normal lids, irises & conjunctiva . ENT: grossly tongue is normal . Neck: no obvious mass . Cardiovascular: S1 S2 normal no gallop . Respiratory: No rhonchi coarse breath sounds . Abdomen: soft . Skin: no rash seen on limited exam . Musculoskeletal: not rigid . Psychiatric:unable to assess . Neurologic: no seizure no involuntary movements         Lab Data:   Basic Metabolic Panel: Recent Labs  Lab 07/22/19 0737 07/23/19 0525 07/23/19 1425 07/24/19 0430 07/25/19 0550  NA 148* 144  --  141 139  K 5.0 5.9*  --  5.4* 4.9  CL 105 99  --  97* 96*  CO2 38* 37*  --  35* 37*  GLUCOSE 100* 94  --  106* 154*  BUN 79* 91*  --  97* 84*  CREATININE 1.20 1.43*  --  1.70* 1.74*  CALCIUM 9.3 9.0  --  8.8* 8.2*  MG  --   --  2.3  --   --     ABG: Recent Labs  Lab 07/22/19 1740  PHART 7.335*  PCO2ART 70.6*  PO2ART 52.1*  HCO3 36.7*  O2SAT 89.3    Liver Function Tests: No results for input(s): AST, ALT, ALKPHOS, BILITOT, PROT, ALBUMIN in the last 168 hours. No results for input(s): LIPASE, AMYLASE in the last 168 hours. No results for input(s): AMMONIA in the last 168  hours.  CBC: Recent Labs  Lab 07/22/19 0737 07/23/19 0525 07/24/19 0430  WBC 12.4* 12.8* 13.9*  HGB 8.1* 7.3* 7.4*  HCT 28.8* 25.3* 25.3*  MCV 109.9* 108.6* 105.4*  PLT 338 306 277    Cardiac Enzymes: No results for input(s): CKTOTAL, CKMB, CKMBINDEX, TROPONINI in the last 168 hours.  BNP (last 3 results) No results for input(s): BNP in the last 8760 hours.  ProBNP (last 3 results) No results for input(s): PROBNP in the last 8760 hours.  Radiological Exams: CT ABDOMEN PELVIS WO CONTRAST  Result Date: 07/26/2019 CLINICAL DATA:  Abdominal distension.  Bowel obstruction suspected. EXAM: CT ABDOMEN AND PELVIS WITHOUT CONTRAST TECHNIQUE: Multidetector CT imaging of the abdomen and pelvis was performed following the standard protocol without IV contrast. COMPARISON:  Radiograph yesterday.  CT 05/16/2019 FINDINGS: Lower chest: Moderate bilateral pleural effusions and compressive atelectasis. Pacemaker partially included. Mild cardiomegaly. Ground-glass opacity in the right middle lobe is partially included. Hepatobiliary: No evidence of focal hepatic abnormality allowing for lack of contrast. External artifact from overlying monitoring device partially obscures evaluation of the gallbladder. No pericholecystic inflammation. No biliary dilatation. Pancreas: No ductal dilatation or inflammation. Spleen: Normal in size without focal abnormality. Small amount of perisplenic free fluid in the left upper quadrant. Adrenals/Urinary Tract: Normal adrenal glands. No  hydronephrosis. No renal calculi. Bilateral renal cysts which appears similar to prior exam. Urinary bladder is decompressed. Stomach/Bowel: Gastrostomy tube appropriately positioned in the stomach. Stomach is fluid-filled. No obvious gastric wall thickening. Small bowel is diffusely dilated and fluid-filled. Small bowel are dilated to the ileocolic junction. No decompressed small bowel or transition point. Enteric sutures noted in the cecum.  There is ascending colonic tortuosity. Small amount of stool and air within the ascending and transverse colon. More distal colon is decompressed with small volume of stool. There is no colonic wall thickening or inflammatory change. No abnormal rectal distention. Vascular/Lymphatic: Aortic atherosclerosis. No aortic aneurysm. No bulky abdominopelvic adenopathy. Reproductive: Prostate is unremarkable. Other: Small volume of abdominal ascites, with more moderate ascites in pelvis. Fluid measures simple fluid density. There is no free air. Mild generalized subcutaneous fat stranding/edema. Musculoskeletal: Heterogeneous enlargement of right sartorius muscle suspicious for hematoma. Multilevel degenerative change in the spine. Sacral decubitus ulcer with air extending to bone. No obvious bony destructive change. No abscess. IMPRESSION: 1. Diffusely dilated and fluid-filled small bowel to the ileocolic junction, without decompressed small bowel or transition point. Findings favor generalized ileus rather than obstruction. 2. Small volume of abdominal ascites, with more moderate ascites in the pelvis. 3. Moderate bilateral pleural effusions and compressive atelectasis. Ground-glass opacity in the right middle lobe is partially included. 4. Sacral decubitus ulcer with air extending to bone. No bony destructive change or focal fluid collection. 5. Heterogeneous enlargement of right sartorius muscle suspicious for intramuscular hematoma. Aortic Atherosclerosis (ICD10-I70.0). Electronically Signed   By: Narda RutherfordMelanie  Sanford M.D.   On: 07/26/2019 00:52   DG Abd Portable 1V  Result Date: 07/25/2019 CLINICAL DATA:  Abdominal distension. EXAM: PORTABLE ABDOMEN - 1 VIEW COMPARISON:  Radiograph 06/23/2019 FINDINGS: Diffuse gaseous distention of small bowel loops measuring up to 3.7 cm. Probable gastrostomy tube with tubing noted over the left abdomen. There is air in the right colon, with otherwise paucity of large bowel gas. No  significant formed stool. No obvious free air on this supine view. Left pleural effusion is partially included. IMPRESSION: Diffuse gaseous distention of small bowel loops measuring up to 3.7 cm, suspicious for small bowel obstruction. Possibility of ileus is also considered given air distended right colon, but obstruction is not excluded on radiograph. Electronically Signed   By: Narda RutherfordMelanie  Sanford M.D.   On: 07/25/2019 22:26   ECHOCARDIOGRAM COMPLETE  Result Date: 07/26/2019    ECHOCARDIOGRAM REPORT   Patient Name:   Merton BorderRTHUR Mcilvaine Date of Exam: 07/26/2019 Medical Rec #:  [id number]      Height:       73.0 in Accession #:    [id number]     Weight:       218.0 lb Date of Birth:  Apr 03, 1945      BSA:          2.232 m Patient Age:    74 years       BP:           118/59 mmHg Patient Gender: M              HR:           86 bpm. Exam Location:  Inpatient Procedure: 2D Echo and Intracardiac Opacification Agent Indications:     Cardiac arrest I46.9  History:         Patient has no prior history of Echocardiogram examinations.  Risk Factors:Hypertension. Acute on chronic respiratory failure                  with hypoxia                  Chronic atrial fibrillation                  COVID-19 virus infection                  Severe sepsis                  Multifocal pneumonia                  Metabolic encephalopathy                  Chronic kidney disease.  Sonographer:     Leta Jungling RDCS Referring Phys:  [id number] ALI HIJAZI Diagnosing Phys: Orpah Cobb MD  Sonographer Comments: Echo performed with patient supine and on artificial respirator. Patient become agitated very easily making imaging difficult. IMPRESSIONS  1. Left ventricular ejection fraction, by estimation, is 20 to 25%. The left ventricle has severely decreased function. The left ventricle demonstrates global hypokinesis. The left ventricular internal cavity size was mildly dilated. Left ventricular diastolic parameters are consistent with  Grade I diastolic dysfunction (impaired relaxation).  2. Right ventricular systolic function is moderately reduced. The right ventricular size is normal.  3. Left atrial size was moderately dilated.  4. Right atrial size was moderately dilated.  5. Large pleural effusion in the left lateral region.  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  7. Tricuspid valve regurgitation is mild to moderate.  8. The aortic valve is tricuspid. Aortic valve regurgitation is mild. Mild to moderate aortic valve sclerosis/calcification is present, without any evidence of aortic stenosis.  9. There is mild (Grade II) atheroma plaque involving the ascending aorta. 10. The inferior vena cava is dilated in size with <50% respiratory variability, suggesting right atrial pressure of 15 mmHg. FINDINGS  Left Ventricle: Left ventricular ejection fraction, by estimation, is 20 to 25%. The left ventricle has severely decreased function. The left ventricle demonstrates global hypokinesis. Definity contrast agent was given IV to delineate the left ventricular endocardial borders. The left ventricular internal cavity size was mildly dilated. There is no left ventricular hypertrophy. Left ventricular diastolic parameters are consistent with Grade I diastolic dysfunction (impaired relaxation). Right Ventricle: The right ventricular size is normal. No increase in right ventricular wall thickness. Right ventricular systolic function is moderately reduced. Left Atrium: Left atrial size was moderately dilated. Right Atrium: Right atrial size was moderately dilated. Pericardium: There is no evidence of pericardial effusion. Mitral Valve: The mitral valve is myxomatous. There is mild calcification of the mitral valve leaflet(s). Mildly decreased mobility of the mitral valve leaflets. Moderate mitral annular calcification. Moderate to severe mitral valve regurgitation. MV peak gradient, 2.2 mmHg. The mean mitral valve  gradient is 1.0 mmHg. Tricuspid Valve: The tricuspid valve is normal in structure. Tricuspid valve regurgitation is mild to moderate. Aortic Valve: The aortic valve is tricuspid. . There is mild thickening and mild calcification of the aortic valve. Aortic valve regurgitation is mild. Mild to moderate aortic valve sclerosis/calcification is present, without any evidence of aortic stenosis. There is mild thickening of the aortic valve. There is mild calcification of the aortic valve. Pulmonic Valve: The pulmonic valve was normal in structure. Pulmonic valve regurgitation is trivial.  Aorta: The aortic root is normal in size and structure. There is mild (Grade II) atheroma plaque involving the ascending aorta. Venous: The inferior vena cava is dilated in size with less than 50% respiratory variability, suggesting right atrial pressure of 15 mmHg. IAS/Shunts: No atrial level shunt detected by color flow Doppler. Additional Comments: There is a large pleural effusion in the left lateral region.  LEFT VENTRICLE PLAX 2D LVIDd:         6.30 cm      Diastology LVIDs:         5.70 cm      LV e' lateral:   10.20 cm/s LV PW:         0.60 cm      LV E/e' lateral: 7.2 LV IVS:        0.80 cm      LV e' medial:    5.17 cm/s LVOT diam:     2.30 cm      LV E/e' medial:  14.3 LV SV:         53 LV SV Index:   24 LVOT Area:     4.15 cm  LV Volumes (MOD) LV vol d, MOD A2C: 174.0 ml LV vol d, MOD A4C: 161.0 ml LV vol s, MOD A2C: 142.0 ml LV vol s, MOD A4C: 118.0 ml LV SV MOD A2C:     32.0 ml LV SV MOD A4C:     161.0 ml LV SV MOD BP:      38.5 ml RIGHT VENTRICLE RV S prime:     9.86 cm/s TAPSE (M-mode): 2.4 cm LEFT ATRIUM           Index LA diam:      4.20 cm 1.88 cm/m LA Vol (A2C): 42.4 ml 19.00 ml/m LA Vol (A4C): 61.9 ml 27.73 ml/m  AORTIC VALVE LVOT Vmax:   74.40 cm/s LVOT Vmean:  52.100 cm/s LVOT VTI:    0.128 m  AORTA Ao Root diam: 3.10 cm MITRAL VALVE               TRICUSPID VALVE MV Area (PHT): 5.97 cm    TR Peak grad:   21.0  mmHg MV Peak grad:  2.2 mmHg    TR Vmax:        229.00 cm/s MV Mean grad:  1.0 mmHg MV Vmax:       0.74 m/s    SHUNTS MV Vmean:      45.0 cm/s   Systemic VTI:  0.13 m MV Decel Time: 127 msec    Systemic Diam: 2.30 cm MR Peak grad: 70.2 mmHg MR Mean grad: 42.0 mmHg MR Vmax:      419.00 cm/s MR Vmean:     305.0 cm/s MV E velocity: 73.70 cm/s MV A velocity: 81.00 cm/s MV E/A ratio:  0.91 Orpah Cobb MD Electronically signed by Orpah Cobb MD Signature Date/Time: 07/26/2019/3:58:54 PM    Final     Assessment/Plan Active Problems:   Acute on chronic respiratory failure with hypoxia (HCC)   Chronic atrial fibrillation (HCC)   COVID-19 virus infection   Severe sepsis (HCC)   Multifocal pneumonia   Metabolic encephalopathy   1. Acute on chronic respiratory failure hypoxia plan is to continue with full support on the ventilator patient is currently not tolerating weaning attempts. 2. Chronic atrial fibrillation rate is controlled we will continue to monitor.  Patient had an echocardiogram done showing an ejection fraction of 20 to 25% extremely poor prognosis  3. COVID-19 virus infection resolved 4. Severe sepsis hemodynamics Reitnauer stable 5. Multifocal pneumonia prognosis guarded 6. Metabolic encephalopathy no change we will continue to monitor   I have personally seen and evaluated the patient, evaluated laboratory and imaging results, formulated the assessment and plan and placed orders. The Patient requires high complexity decision making with multiple systems involvement.  Rounds were done with the Respiratory Therapy Director and Staff therapists and discussed with nursing staff also.  Allyne Gee, MD [facility] Pulmonary Critical Care Medicine Sleep Medicine

## 2019-07-28 ENCOUNTER — Other Ambulatory Visit (HOSPITAL_COMMUNITY): Payer: Medicare Other

## 2019-07-28 DIAGNOSIS — J9621 Acute and chronic respiratory failure with hypoxia: Secondary | ICD-10-CM | POA: Diagnosis not present

## 2019-07-28 DIAGNOSIS — U071 COVID-19: Secondary | ICD-10-CM | POA: Diagnosis not present

## 2019-07-28 DIAGNOSIS — G9341 Metabolic encephalopathy: Secondary | ICD-10-CM | POA: Diagnosis not present

## 2019-07-28 DIAGNOSIS — I482 Chronic atrial fibrillation, unspecified: Secondary | ICD-10-CM | POA: Diagnosis not present

## 2019-07-28 NOTE — Progress Notes (Signed)
Pulmonary Critical Care Medicine [place]   PULMONARY CRITICAL CARE SERVICE  PROGRESS NOTE  Date of Service: 07/28/2019  Sandeep Radell  GHW:[id number]  DOB: 12-24-1945   DOA: 06/23/2019  Referring Physician: Merton Border, MD  HPI: Henry Mcbride is a 74 y.o. male seen for follow up of Acute on Chronic Respiratory Failure.  Patient is on assist control mode good support right now.  Was not able to tolerate weaning.  The patient's brother was present in the room and spoke to him about the findings of his echocardiogram and they have already determined that if he has cardiac arrest they are going to let him go  Medications: Reviewed on Rounds  Physical Exam:  Vitals: Temperature 96.0 pulse 70 respiratory rate 17 blood pressure is 114/65 saturations 100%  Ventilator Settings on assist control FiO2 35% tidal volume 450 PEEP 5  . General: Comfortable at this time . Eyes: Grossly normal lids, irises & conjunctiva . ENT: grossly tongue is normal . Neck: no obvious mass . Cardiovascular: S1 S2 normal no gallop . Respiratory: No rhonchi coarse breath sounds . Abdomen: soft . Skin: no rash seen on limited exam . Musculoskeletal: not rigid . Psychiatric:unable to assess . Neurologic: no seizure no involuntary movements         Lab Data:   Basic Metabolic Panel: Recent Labs  Lab 07/22/19 0737 07/23/19 0525 07/23/19 1425 07/24/19 0430 07/25/19 0550 07/27/19 1514  NA 148* 144  --  141 139 139  K 5.0 5.9*  --  5.4* 4.9 5.5*  CL 105 99  --  97* 96* 100  CO2 38* 37*  --  35* 37* 32  GLUCOSE 100* 94  --  106* 154* 97  BUN 79* 91*  --  97* 84* 84*  CREATININE 1.20 1.43*  --  1.70* 1.74* 2.13*  CALCIUM 9.3 9.0  --  8.8* 8.2* 8.3*  MG  --   --  2.3  --   --  2.2    ABG: Recent Labs  Lab 07/22/19 1740  PHART 7.335*  PCO2ART 70.6*  PO2ART 52.1*  HCO3 36.7*  O2SAT 89.3    Liver Function Tests: No results for input(s): AST, ALT, ALKPHOS, BILITOT, PROT,  ALBUMIN in the last 168 hours. No results for input(s): LIPASE, AMYLASE in the last 168 hours. No results for input(s): AMMONIA in the last 168 hours.  CBC: Recent Labs  Lab 07/22/19 0737 07/23/19 0525 07/24/19 0430  WBC 12.4* 12.8* 13.9*  HGB 8.1* 7.3* 7.4*  HCT 28.8* 25.3* 25.3*  MCV 109.9* 108.6* 105.4*  PLT 338 306 277    Cardiac Enzymes: No results for input(s): CKTOTAL, CKMB, CKMBINDEX, TROPONINI in the last 168 hours.  BNP (last 3 results) No results for input(s): BNP in the last 8760 hours.  ProBNP (last 3 results) No results for input(s): PROBNP in the last 8760 hours.  Radiological Exams: DG Abd Portable 1V  Result Date: 07/28/2019 CLINICAL DATA:  Ileus. EXAM: PORTABLE ABDOMEN - 1 VIEW COMPARISON:  One-view abdomen 07/25/2019. CT abdomen pelvis 07/26/2019. FINDINGS: Mild diffuse gaseous distension of small bowel is similar the prior exam. Gas is present in the ascending and transverse colon. No focal obstruction is present. No free air is evident. IMPRESSION: Stable mild diffuse gaseous distension of small bowel compatible with ileus. Electronically Signed   By: San Morelle M.D.   On: 07/28/2019 06:37    Assessment/Plan Active Problems:   Acute on chronic respiratory failure with hypoxia (HCC)  Chronic atrial fibrillation (HCC)   COVID-19 virus infection   Severe sepsis (HCC)   Multifocal pneumonia   Metabolic encephalopathy   1. Acute on chronic respiratory failure hypoxia we will continue with the full support on the ventilator patient not tolerating any weaning at this stage. 2. Chronic atrial fibrillation rate controlled we will continue supportive care patient does have severely reduced ejection fraction of 20 to 25% 3. COVID-19 virus infection treated in resolution phase 4. Multifocal pneumonia still with significant changes on chest films 5. Metabolic encephalopathy remains confused   I have personally seen and evaluated the patient,  evaluated laboratory and imaging results, formulated the assessment and plan and placed orders. The Patient requires high complexity decision making with multiple systems involvement.  Rounds were done with the Respiratory Therapy Director and Staff therapists and discussed with nursing staff also.  Yevonne Pax, MD [facility] Pulmonary Critical Care Medicine Sleep Medicine

## 2019-07-29 ENCOUNTER — Other Ambulatory Visit (HOSPITAL_COMMUNITY): Payer: Medicare Other

## 2019-07-29 DIAGNOSIS — U071 COVID-19: Secondary | ICD-10-CM | POA: Diagnosis not present

## 2019-07-29 DIAGNOSIS — J9621 Acute and chronic respiratory failure with hypoxia: Secondary | ICD-10-CM | POA: Diagnosis not present

## 2019-07-29 DIAGNOSIS — G9341 Metabolic encephalopathy: Secondary | ICD-10-CM | POA: Diagnosis not present

## 2019-07-29 DIAGNOSIS — I482 Chronic atrial fibrillation, unspecified: Secondary | ICD-10-CM | POA: Diagnosis not present

## 2019-07-29 LAB — CBC
HCT: 25.8 % — ABNORMAL LOW (ref 39.0–52.0)
Hemoglobin: 7.7 g/dL — ABNORMAL LOW (ref 13.0–17.0)
MCH: 31.3 pg (ref 26.0–34.0)
MCHC: 29.8 g/dL — ABNORMAL LOW (ref 30.0–36.0)
MCV: 104.9 fL — ABNORMAL HIGH (ref 80.0–100.0)
Platelets: 173 10*3/uL (ref 150–400)
RBC: 2.46 MIL/uL — ABNORMAL LOW (ref 4.22–5.81)
RDW: 21.9 % — ABNORMAL HIGH (ref 11.5–15.5)
WBC: 10.8 10*3/uL — ABNORMAL HIGH (ref 4.0–10.5)
nRBC: 0 % (ref 0.0–0.2)

## 2019-07-29 LAB — BASIC METABOLIC PANEL
Anion gap: 7 (ref 5–15)
BUN: 66 mg/dL — ABNORMAL HIGH (ref 8–23)
CO2: 34 mmol/L — ABNORMAL HIGH (ref 22–32)
Calcium: 8.2 mg/dL — ABNORMAL LOW (ref 8.9–10.3)
Chloride: 101 mmol/L (ref 98–111)
Creatinine, Ser: 1.55 mg/dL — ABNORMAL HIGH (ref 0.61–1.24)
GFR calc Af Amer: 50 mL/min — ABNORMAL LOW (ref >60)
GFR calc non Af Amer: 43 mL/min — ABNORMAL LOW (ref >60)
Glucose, Bld: 100 mg/dL — ABNORMAL HIGH (ref 70–99)
Potassium: 4.3 mmol/L (ref 3.5–5.1)
Sodium: 142 mmol/L (ref 135–145)

## 2019-07-29 NOTE — Progress Notes (Signed)
Pulmonary Critical Care Medicine [place]   PULMONARY CRITICAL CARE SERVICE  PROGRESS NOTE  Date of Service: 07/29/2019  Henry Mcbride  BHA:[id number]  DOB: 1945-11-19   DOA: 06/23/2019  Referring Physician: Merton Border, MD  HPI: Henry Mcbride is a 74 y.o. male seen for follow up of Acute on Chronic Respiratory Failure.  Patient is on the ventilator on full support was attempted on pressure support but did not tolerate  Medications: Reviewed on Rounds  Physical Exam:  Vitals: Temperature is 96.4 pulse 70 respiratory rate 34 blood pressure is 118/60 saturations 100%  Ventilator Settings on assist control FiO2 35% tidal volume 427 PEEP 5  . General: Comfortable at this time . Eyes: Grossly normal lids, irises & conjunctiva . ENT: grossly tongue is normal . Neck: no obvious mass . Cardiovascular: S1 S2 normal no gallop . Respiratory: No rhonchi coarse breath sounds . Abdomen: soft . Skin: no rash seen on limited exam . Musculoskeletal: not rigid . Psychiatric:unable to assess . Neurologic: no seizure no involuntary movements         Lab Data:   Basic Metabolic Panel: Recent Labs  Lab 07/23/19 0525 07/23/19 1425 07/24/19 0430 07/25/19 0550 07/27/19 1514 07/29/19 0708  NA 144  --  141 139 139 142  K 5.9*  --  5.4* 4.9 5.5* 4.3  CL 99  --  97* 96* 100 101  CO2 37*  --  35* 37* 32 34*  GLUCOSE 94  --  106* 154* 97 100*  BUN 91*  --  97* 84* 84* 66*  CREATININE 1.43*  --  1.70* 1.74* 2.13* 1.55*  CALCIUM 9.0  --  8.8* 8.2* 8.3* 8.2*  MG  --  2.3  --   --  2.2  --     ABG: Recent Labs  Lab 07/22/19 1740  PHART 7.335*  PCO2ART 70.6*  PO2ART 52.1*  HCO3 36.7*  O2SAT 89.3    Liver Function Tests: No results for input(s): AST, ALT, ALKPHOS, BILITOT, PROT, ALBUMIN in the last 168 hours. No results for input(s): LIPASE, AMYLASE in the last 168 hours. No results for input(s): AMMONIA in the last 168 hours.  CBC: Recent Labs  Lab  07/23/19 0525 07/24/19 0430 07/29/19 0708  WBC 12.8* 13.9* 10.8*  HGB 7.3* 7.4* 7.7*  HCT 25.3* 25.3* 25.8*  MCV 108.6* 105.4* 104.9*  PLT 306 277 173    Cardiac Enzymes: No results for input(s): CKTOTAL, CKMB, CKMBINDEX, TROPONINI in the last 168 hours.  BNP (last 3 results) No results for input(s): BNP in the last 8760 hours.  ProBNP (last 3 results) No results for input(s): PROBNP in the last 8760 hours.  Radiological Exams: DG CHEST PORT 1 VIEW  Result Date: 07/29/2019 CLINICAL DATA:  Respiratory failure. EXAM: PORTABLE CHEST 1 VIEW COMPARISON:  One-view chest x-ray 07/24/2019 FINDINGS: Tracheostomy tube is in place. PICC line is stable. Pacing wires are stable. Heart is enlarged. Asymmetric right-sided edema and effusion is noted. Aeration of the left lung is slightly improved. IMPRESSION: 1. Slight improved aeration of the left lung. 2. Persistent right-sided edema and effusion. Electronically Signed   By: San Morelle M.D.   On: 07/29/2019 06:50   DG Abd Portable 1V  Result Date: 07/29/2019 CLINICAL DATA:  Ileus. EXAM: PORTABLE ABDOMEN - 1 VIEW COMPARISON:  Abdomen 07/28/2019.  CT 07/26/2019. FINDINGS: Gastrostomy tube noted in stable position. Persistent dilated loops of small bowel noted. Slight improvement from prior exam. Colonic gas pattern is normal.  No free air identified. Degenerative changes scoliosis lumbar spine. Degenerative changes both hips. IMPRESSION: 1.  Gastrostomy tube noted stable position. 2. Persistent dilated loops of small bowel noted. Slight improvement from prior exam. Continued follow-up exams to demonstrate resolution suggested. Colonic gas pattern is normal. Electronically Signed   By: Maisie Fus  Register   On: 07/29/2019 06:52   DG Abd Portable 1V  Result Date: 07/28/2019 CLINICAL DATA:  Ileus. EXAM: PORTABLE ABDOMEN - 1 VIEW COMPARISON:  One-view abdomen 07/25/2019. CT abdomen pelvis 07/26/2019. FINDINGS: Mild diffuse gaseous distension of  small bowel is similar the prior exam. Gas is present in the ascending and transverse colon. No focal obstruction is present. No free air is evident. IMPRESSION: Stable mild diffuse gaseous distension of small bowel compatible with ileus. Electronically Signed   By: Marin Roberts M.D.   On: 07/28/2019 06:37    Assessment/Plan Active Problems:   Acute on chronic respiratory failure with hypoxia (HCC)   Chronic atrial fibrillation (HCC)   COVID-19 virus infection   Severe sepsis (HCC)   Multifocal pneumonia   Metabolic encephalopathy   1. Acute on chronic respiratory failure hypoxia patient is failing pressure support wean today we will reassess again poor EF is contributing to the patient's failure to wean 2. Chronic atrial fibrillation rate controlled 3. COVID-19 virus infection resolved 4. Severe sepsis hemodynamics are stable this is resolved 5. Multifocal pneumonia treated we will continue to follow 6. Metabolic encephalopathy no change supportive care   I have personally seen and evaluated the patient, evaluated laboratory and imaging results, formulated the assessment and plan and placed orders. The Patient requires high complexity decision making with multiple systems involvement.  Rounds were done with the Respiratory Therapy Director and Staff therapists and discussed with nursing staff also.  Yevonne Pax, MD [facility] Pulmonary Critical Care Medicine Sleep Medicine

## 2019-07-30 ENCOUNTER — Other Ambulatory Visit (HOSPITAL_COMMUNITY): Payer: Medicare Other

## 2019-07-30 DIAGNOSIS — G9341 Metabolic encephalopathy: Secondary | ICD-10-CM | POA: Diagnosis not present

## 2019-07-30 DIAGNOSIS — J9621 Acute and chronic respiratory failure with hypoxia: Secondary | ICD-10-CM | POA: Diagnosis not present

## 2019-07-30 DIAGNOSIS — U071 COVID-19: Secondary | ICD-10-CM | POA: Diagnosis not present

## 2019-07-30 DIAGNOSIS — I482 Chronic atrial fibrillation, unspecified: Secondary | ICD-10-CM | POA: Diagnosis not present

## 2019-07-30 NOTE — Progress Notes (Signed)
Pulmonary Critical Care Medicine [place]   PULMONARY CRITICAL CARE SERVICE  PROGRESS NOTE  Date of Service: 07/30/2019  Henry Mcbride  ZOX:[id number]  DOB: Dec 27, 1945   DOA: 06/23/2019  Referring Physician: Merton Border, MD  HPI: Henry Mcbride is a 74 y.o. male seen for follow up of Acute on Chronic Respiratory Failure.  He is on pressure support right now on 35% FiO2 has been on 12/5  Medications: Reviewed on Rounds  Physical Exam:  Vitals: Temperature is 96.4 pulse 70 respiratory 22 blood pressure is 126/76 saturations 100%  Ventilator Settings on pressure support FiO2 35% pressure support 12 PEEP 5  . General: Comfortable at this time . Eyes: Grossly normal lids, irises & conjunctiva . ENT: grossly tongue is normal . Neck: no obvious mass . Cardiovascular: S1 S2 normal no gallop . Respiratory: No rhonchi coarse breath sounds . Abdomen: soft . Skin: no rash seen on limited exam . Musculoskeletal: not rigid . Psychiatric:unable to assess . Neurologic: no seizure no involuntary movements         Lab Data:   Basic Metabolic Panel: Recent Labs  Lab 07/23/19 1425 07/24/19 0430 07/25/19 0550 07/27/19 1514 07/29/19 0708  NA  --  141 139 139 142  K  --  5.4* 4.9 5.5* 4.3  CL  --  97* 96* 100 101  CO2  --  35* 37* 32 34*  GLUCOSE  --  106* 154* 97 100*  BUN  --  97* 84* 84* 66*  CREATININE  --  1.70* 1.74* 2.13* 1.55*  CALCIUM  --  8.8* 8.2* 8.3* 8.2*  MG 2.3  --   --  2.2  --     ABG: No results for input(s): PHART, PCO2ART, PO2ART, HCO3, O2SAT in the last 168 hours.  Liver Function Tests: No results for input(s): AST, ALT, ALKPHOS, BILITOT, PROT, ALBUMIN in the last 168 hours. No results for input(s): LIPASE, AMYLASE in the last 168 hours. No results for input(s): AMMONIA in the last 168 hours.  CBC: Recent Labs  Lab 07/24/19 0430 07/29/19 0708  WBC 13.9* 10.8*  HGB 7.4* 7.7*  HCT 25.3* 25.8*  MCV 105.4* 104.9*  PLT 277 173     Cardiac Enzymes: No results for input(s): CKTOTAL, CKMB, CKMBINDEX, TROPONINI in the last 168 hours.  BNP (last 3 results) No results for input(s): BNP in the last 8760 hours.  ProBNP (last 3 results) No results for input(s): PROBNP in the last 8760 hours.  Radiological Exams: DG CHEST PORT 1 VIEW  Result Date: 07/29/2019 CLINICAL DATA:  Respiratory failure. EXAM: PORTABLE CHEST 1 VIEW COMPARISON:  One-view chest x-ray 07/24/2019 FINDINGS: Tracheostomy tube is in place. PICC line is stable. Pacing wires are stable. Heart is enlarged. Asymmetric right-sided edema and effusion is noted. Aeration of the left lung is slightly improved. IMPRESSION: 1. Slight improved aeration of the left lung. 2. Persistent right-sided edema and effusion. Electronically Signed   By: San Morelle M.D.   On: 07/29/2019 06:50   DG Abd Portable 1V  Result Date: 07/30/2019 CLINICAL DATA:  Ileus. EXAM: PORTABLE ABDOMEN - 1 VIEW COMPARISON:  One-view abdomen 07/29/2019 FINDINGS: Previously noted dilated loops of small bowel demonstrate a more normal appearance on today's study. No obstruction or free air is present. Degenerative changes are again noted in the lumbar spine. IMPRESSION: 1. Improving ileus. 2. No obstruction or free air. Electronically Signed   By: San Morelle M.D.   On: 07/30/2019 06:57   DG  Abd Portable 1V  Result Date: 07/29/2019 CLINICAL DATA:  Ileus. EXAM: PORTABLE ABDOMEN - 1 VIEW COMPARISON:  Abdomen 07/28/2019.  CT 07/26/2019. FINDINGS: Gastrostomy tube noted in stable position. Persistent dilated loops of small bowel noted. Slight improvement from prior exam. Colonic gas pattern is normal. No free air identified. Degenerative changes scoliosis lumbar spine. Degenerative changes both hips. IMPRESSION: 1.  Gastrostomy tube noted stable position. 2. Persistent dilated loops of small bowel noted. Slight improvement from prior exam. Continued follow-up exams to demonstrate resolution  suggested. Colonic gas pattern is normal. Electronically Signed   By: Maisie Fus  Register   On: 07/29/2019 06:52    Assessment/Plan Active Problems:   Acute on chronic respiratory failure with hypoxia (HCC)   Chronic atrial fibrillation (HCC)   COVID-19 virus infection   Severe sepsis (HCC)   Multifocal pneumonia   Metabolic encephalopathy   1. Acute on chronic respiratory failure hypoxia we will continue with the pressure support patient remains on 12/5 which will be continued to wean on current settings 2. Chronic atrial fibrillation rate controlled we will continue to follow. 3. COVID-19 virus infection resolved we will continue with supportive care 4. Severe sepsis resolved 5. Multifocal pneumonia patient's chest x-ray has been showing some improvement we will continue to monitor 6. Metabolic encephalopathy no change   I have personally seen and evaluated the patient, evaluated laboratory and imaging results, formulated the assessment and plan and placed orders. The Patient requires high complexity decision making with multiple systems involvement.  Rounds were done with the Respiratory Therapy Director and Staff therapists and discussed with nursing staff also.  Yevonne Pax, MD [facility] Pulmonary Critical Care Medicine Sleep Medicine

## 2019-07-31 ENCOUNTER — Other Ambulatory Visit (HOSPITAL_COMMUNITY): Payer: Medicare Other

## 2019-07-31 DIAGNOSIS — U071 COVID-19: Secondary | ICD-10-CM | POA: Diagnosis not present

## 2019-07-31 DIAGNOSIS — I482 Chronic atrial fibrillation, unspecified: Secondary | ICD-10-CM | POA: Diagnosis not present

## 2019-07-31 DIAGNOSIS — G9341 Metabolic encephalopathy: Secondary | ICD-10-CM | POA: Diagnosis not present

## 2019-07-31 DIAGNOSIS — J9621 Acute and chronic respiratory failure with hypoxia: Secondary | ICD-10-CM | POA: Diagnosis not present

## 2019-07-31 NOTE — Progress Notes (Addendum)
Pulmonary Critical Care Medicine [place]   PULMONARY CRITICAL CARE SERVICE  PROGRESS NOTE  Date of Service: 07/31/2019  Orvie Caradine  IRJ:[id number]  DOB: Jul 27, 1945   DOA: 06/23/2019  Referring Physician: Merton Border, MD  HPI: Jasmine Maceachern is a 74 y.o. male seen for follow up of Acute on Chronic Respiratory Failure. Patient was able to do 1 hour and 40 minutes on pressure support today before becoming tachypneic and placed back on full support on the vent currently on 35% FiO2  Medications: Reviewed on Rounds  Physical Exam:  Vitals: Pulse 74 respirations 25 BP 163/70 O2 sat 100% temp 98.6  Ventilator Settings ventilator mode AC VC rate 20 tidal volume 450 PEEP of 5 with an FiO2 of 35%  . General: Comfortable at this time . Eyes: Grossly normal lids, irises & conjunctiva . ENT: grossly tongue is normal . Neck: no obvious mass . Cardiovascular: S1 S2 normal no gallop . Respiratory: No rales or rhonchi noted . Abdomen: soft . Skin: no rash seen on limited exam . Musculoskeletal: not rigid . Psychiatric:unable to assess . Neurologic: no seizure no involuntary movements         Lab Data:   Basic Metabolic Panel: Recent Labs  Lab 07/25/19 0550 07/27/19 1514 07/29/19 0708  NA 139 139 142  K 4.9 5.5* 4.3  CL 96* 100 101  CO2 37* 32 34*  GLUCOSE 154* 97 100*  BUN 84* 84* 66*  CREATININE 1.74* 2.13* 1.55*  CALCIUM 8.2* 8.3* 8.2*  MG  --  2.2  --     ABG: No results for input(s): PHART, PCO2ART, PO2ART, HCO3, O2SAT in the last 168 hours.  Liver Function Tests: No results for input(s): AST, ALT, ALKPHOS, BILITOT, PROT, ALBUMIN in the last 168 hours. No results for input(s): LIPASE, AMYLASE in the last 168 hours. No results for input(s): AMMONIA in the last 168 hours.  CBC: Recent Labs  Lab 07/29/19 0708  WBC 10.8*  HGB 7.7*  HCT 25.8*  MCV 104.9*  PLT 173    Cardiac Enzymes: No results for input(s): CKTOTAL, CKMB, CKMBINDEX,  TROPONINI in the last 168 hours.  BNP (last 3 results) No results for input(s): BNP in the last 8760 hours.  ProBNP (last 3 results) No results for input(s): PROBNP in the last 8760 hours.  Radiological Exams: DG Abd 1 View  Result Date: 07/31/2019 CLINICAL DATA:  Abdominal distention. EXAM: ABDOMEN - 1 VIEW COMPARISON:  07/30/2019. FINDINGS: G-tube noted in stable position. Soft tissue structures are unremarkable. Air-filled loops of small and large bowel noted. No prominent bowel distention. Mild bowel wall thickening cannot be excluded. A process such as enteritis/colitis cannot be excluded. Follow-up exam suggested demonstrate resolution. No free air. Bibasilar atelectasis. IMPRESSION: 1.  G-tube noted stable position. 2. Air-filled loops of small large bowel noted. No prominent bowel distention. Mild bowel wall thickening cannot be excluded. A process such as enteritis/colitis cannot be excluded. Follow-up exam suggested to demonstrate resolution. 3.  Bibasilar atelectasis. Electronically Signed   By: Marcello Moores  Register   On: 07/31/2019 13:11   DG CHEST PORT 1 VIEW  Result Date: 07/31/2019 CLINICAL DATA:  Congestive heart failure, tracheostomy. EXAM: PORTABLE CHEST 1 VIEW COMPARISON:  07/29/2019 and CT chest 06/09/2019. 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 Right PICC tip is in the low SVC. Heart size stable. Worsening mixed interstitial and airspace opacification, right greater than left, with left  lower lobe collapse/consolidation. Bilateral pleural effusions, right greater than left. IMPRESSION: Worsening congestive heart failure with left lower lobe collapse/consolidation. Pneumonia cannot be excluded. Electronically Signed   By: Leanna Battles M.D.   On: 07/31/2019 13:11   DG Abd Portable 1V  Result Date: 07/30/2019 CLINICAL DATA:  Ileus. EXAM: PORTABLE ABDOMEN - 1 VIEW COMPARISON:  One-view abdomen 07/29/2019  FINDINGS: Previously noted dilated loops of small bowel demonstrate a more normal appearance on today's study. No obstruction or free air is present. Degenerative changes are again noted in the lumbar spine. IMPRESSION: 1. Improving ileus. 2. No obstruction or free air. Electronically Signed   By: Marin Roberts M.D.   On: 07/30/2019 06:57    Assessment/Plan Active Problems:   Acute on chronic respiratory failure with hypoxia (HCC)   Chronic atrial fibrillation (HCC)   COVID-19 virus infection   Severe sepsis (HCC)   Multifocal pneumonia   Metabolic encephalopathy   1. Acute on chronic respiratory failure hypoxia continue with full support vent at this time we will continue to attempt weaning to pressure support. Continue supportive measures. 2. Chronic atrial fibrillation rate controlled we will continue to follow. 3. COVID-19 virus infection resolved we will continue with supportive care 4. Severe sepsis resolved 5. Multifocal pneumonia patient's chest x-ray has been showing some improvement we will continue to monitor 6. Metabolic encephalopathy no change   I have personally seen and evaluated the patient, evaluated laboratory and imaging results, formulated the assessment and plan and placed orders. The Patient requires high complexity decision making with multiple systems involvement.  Rounds were done with the Respiratory Therapy Director and Staff therapists and discussed with nursing staff also.  Yevonne Pax, MD [facility] Pulmonary Critical Care Medicine Sleep Medicine

## 2019-07-31 NOTE — Consult Note (Addendum)
Ref: Galvin Proffer, MD   Subjective:  Awake.  VS stable.  Monitor artifact shows Paced rhythm where as interrogation shows normal sinus rhythm with PVCs  as ventricular bigeminy.. Off dopamine. Ventilator dependent  Objective:  Vital Signs in the last 24 hours:  P: 70's, R: 20. BP : 130/70's. Sinus rhythm with frequent VPCs as ventricular bigeminy. 35 % FiO2 with 100 % O2 saturation.  Physical Exam: BP Readings from Last 1 Encounters:  10/08/18 117/77     Wt Readings from Last 1 Encounters:  10/08/18 98.9 kg    Weight change:  There is no height or weight on file to calculate BMI. HEENT: [place]/AT, Eyes-Brown, Conjunctiva-Pale pink, Sclera-Non-icteric. Tracheostomy with ventilator support. Neck: No JVD, No bruit, Trachea midline. Lungs:  Clearing, Bilateral. Cardiac:  Irregular rhythm, normal S1 and S2, no S3. II/VI systolic murmur. Abdomen:  Soft, non-tender. BS present. Extremities:  No edema present. No cyanosis. No clubbing. CNS: AxOx1, Cranial nerves grossly intact, moves all 4 extremities.  Skin: Warm and dry.   Intake/Output from previous day: No intake/output data recorded.    Lab Results: BMET    Component Value Date/Time   NA 142 07/29/2019 0708   NA 139 07/27/2019 1514   NA 139 07/25/2019 0550   K 4.3 07/29/2019 0708   K 5.5 (H) 07/27/2019 1514   K 4.9 07/25/2019 0550   CL 101 07/29/2019 0708   CL 100 07/27/2019 1514   CL 96 (L) 07/25/2019 0550   CO2 34 (H) 07/29/2019 0708   CO2 32 07/27/2019 1514   CO2 37 (H) 07/25/2019 0550   GLUCOSE 100 (H) 07/29/2019 0708   GLUCOSE 97 07/27/2019 1514   GLUCOSE 154 (H) 07/25/2019 0550   BUN 66 (H) 07/29/2019 0708   BUN 84 (H) 07/27/2019 1514   BUN 84 (H) 07/25/2019 0550   CREATININE 1.55 (H) 07/29/2019 0708   CREATININE 2.13 (H) 07/27/2019 1514   CREATININE 1.74 (H) 07/25/2019 0550   CALCIUM 8.2 (L) 07/29/2019 0708   CALCIUM 8.3 (L) 07/27/2019 1514   CALCIUM 8.2 (L) 07/25/2019 0550   GFRNONAA 43 (L) 07/29/2019  0708   GFRNONAA 30 (L) 07/27/2019 1514   GFRNONAA 38 (L) 07/25/2019 0550   GFRAA 50 (L) 07/29/2019 0708   GFRAA 34 (L) 07/27/2019 1514   GFRAA 44 (L) 07/25/2019 0550   CBC    Component Value Date/Time   WBC 10.8 (H) 07/29/2019 0708   RBC 2.46 (L) 07/29/2019 0708   HGB 7.7 (L) 07/29/2019 0708   HCT 25.8 (L) 07/29/2019 0708   PLT 173 07/29/2019 0708   MCV 104.9 (H) 07/29/2019 0708   MCH 31.3 07/29/2019 0708   MCHC 29.8 (L) 07/29/2019 0708   RDW 21.9 (H) 07/29/2019 0708   LYMPHSABS 1.4 06/24/2019 0533   MONOABS 1.1 (H) 06/24/2019 0533   EOSABS 0.5 06/24/2019 0533   BASOSABS 0.0 06/24/2019 0533   HEPATIC Function Panel Recent Labs    06/24/19 0533  PROT 5.7*   HEMOGLOBIN A1C No components found for: HGA1C,  MPG CARDIAC ENZYMES No results found for: CKTOTAL, CKMB, CKMBINDEX, TROPONINI BNP No results for input(s): PROBNP in the last 8760 hours. TSH Recent Labs    07/23/19 0525  TSH 3.220   CHOLESTEROL No results for input(s): CHOL in the last 8760 hours.  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 Pacemaker interrogation showed no pacemaker malfunction and no paced beats only marked V beats look like paced beats on monitor and not on 12 lead EKG.   LOS: 0 days   Time spent including chart review, lab review, examination, discussion with patient's nurse : 30 min   Dixie Dials  MD  07/31/2019, 10:44 AM

## 2019-08-01 ENCOUNTER — Other Ambulatory Visit (HOSPITAL_COMMUNITY): Payer: Medicare Other

## 2019-08-01 DIAGNOSIS — I482 Chronic atrial fibrillation, unspecified: Secondary | ICD-10-CM | POA: Diagnosis not present

## 2019-08-01 DIAGNOSIS — J9621 Acute and chronic respiratory failure with hypoxia: Secondary | ICD-10-CM | POA: Diagnosis not present

## 2019-08-01 DIAGNOSIS — G9341 Metabolic encephalopathy: Secondary | ICD-10-CM | POA: Diagnosis not present

## 2019-08-01 DIAGNOSIS — U071 COVID-19: Secondary | ICD-10-CM | POA: Diagnosis not present

## 2019-08-01 LAB — CBC
HCT: 27.1 % — ABNORMAL LOW (ref 39.0–52.0)
Hemoglobin: 8.1 g/dL — ABNORMAL LOW (ref 13.0–17.0)
MCH: 31.2 pg (ref 26.0–34.0)
MCHC: 29.9 g/dL — ABNORMAL LOW (ref 30.0–36.0)
MCV: 104.2 fL — ABNORMAL HIGH (ref 80.0–100.0)
Platelets: 252 10*3/uL (ref 150–400)
RBC: 2.6 MIL/uL — ABNORMAL LOW (ref 4.22–5.81)
RDW: 21.5 % — ABNORMAL HIGH (ref 11.5–15.5)
WBC: 10.7 10*3/uL — ABNORMAL HIGH (ref 4.0–10.5)
nRBC: 0 % (ref 0.0–0.2)

## 2019-08-01 LAB — BASIC METABOLIC PANEL
Anion gap: 8 (ref 5–15)
BUN: 45 mg/dL — ABNORMAL HIGH (ref 8–23)
CO2: 28 mmol/L (ref 22–32)
Calcium: 8.5 mg/dL — ABNORMAL LOW (ref 8.9–10.3)
Chloride: 102 mmol/L (ref 98–111)
Creatinine, Ser: 1.18 mg/dL (ref 0.61–1.24)
GFR calc Af Amer: >60 mL/min (ref >60)
GFR calc non Af Amer: >60 mL/min (ref >60)
Glucose, Bld: 98 mg/dL (ref 70–99)
Potassium: 4.3 mmol/L (ref 3.5–5.1)
Sodium: 138 mmol/L (ref 135–145)

## 2019-08-01 LAB — MAGNESIUM: Magnesium: 1.8 mg/dL (ref 1.7–2.4)

## 2019-08-01 NOTE — Progress Notes (Addendum)
Pulmonary Critical Care Medicine [facility]   PULMONARY CRITICAL CARE SERVICE  PROGRESS NOTE  Date of Service: 08/01/2019  Henry Mcbride  NAT:[id number]  DOB: 1945/04/01   DOA: 06/23/2019  Referring Physician: Carron Curie, MD  HPI: Henry Mcbride is a 74 y.o. male seen for follow up of Acute on Chronic Respiratory Failure. Patient completed 4 hours on pressure support today 12/5 and FiO2 28% currently satting well on full support on the ventilator 28% FiO2.  Medications: Reviewed on Rounds  Physical Exam:  Vitals: Pulse 72 respirations 21 BP 167/80 O2 sat 99% temp 96.6  Ventilator Settings ventilator mode AC VC rate of 20 tidal volume 450 PEEP of 5 and FiO2 of 28%  . General: Comfortable at this time . Eyes: Grossly normal lids, irises & conjunctiva . ENT: grossly tongue is normal . Neck: no obvious mass . Cardiovascular: S1 S2 normal no gallop . Respiratory: No rales or rhonchi noted . Abdomen: soft . Skin: no rash seen on limited exam . Musculoskeletal: not rigid . Psychiatric:unable to assess . Neurologic: no seizure no involuntary movements         Lab Data:   Basic Metabolic Panel: Recent Labs  Lab 07/27/19 1514 07/29/19 0708 08/01/19 0634  NA 139 142 138  K 5.5* 4.3 4.3  CL 100 101 102  CO2 32 34* 28  GLUCOSE 97 100* 98  BUN 84* 66* 45*  CREATININE 2.13* 1.55* 1.18  CALCIUM 8.3* 8.2* 8.5*  MG 2.2  --  1.8    ABG: No results for input(s): PHART, PCO2ART, PO2ART, HCO3, O2SAT in the last 168 hours.  Liver Function Tests: No results for input(s): AST, ALT, ALKPHOS, BILITOT, PROT, ALBUMIN in the last 168 hours. No results for input(s): LIPASE, AMYLASE in the last 168 hours. No results for input(s): AMMONIA in the last 168 hours.  CBC: Recent Labs  Lab 07/29/19 0708 08/01/19 0634  WBC 10.8* 10.7*  HGB 7.7* 8.1*  HCT 25.8* 27.1*  MCV 104.9* 104.2*  PLT 173 252    Cardiac Enzymes: No results for input(s): CKTOTAL, CKMB,  CKMBINDEX, TROPONINI in the last 168 hours.  BNP (last 3 results) No results for input(s): BNP in the last 8760 hours.  ProBNP (last 3 results) No results for input(s): PROBNP in the last 8760 hours.  Radiological Exams: DG Abd 1 View  Result Date: 08/01/2019 CLINICAL DATA:  Abnormal white blood cell count. EXAM: ABDOMEN - 1 VIEW COMPARISON:  July 31, 2019. FINDINGS: Gastrostomy tube is in grossly good position. No colonic dilatation is noted. Mildly dilated air-filled small bowel loops are noted in the central and left side of the abdomen concerning for possible focal ileus. IMPRESSION: Mildly dilated air-filled small bowel loops are noted in the central and left side of the abdomen concerning for possible focal ileus. Electronically Signed   By: Lupita Raider M.D.   On: 08/01/2019 12:13   DG Abd 1 View  Result Date: 07/31/2019 CLINICAL DATA:  Abdominal distention. EXAM: ABDOMEN - 1 VIEW COMPARISON:  07/30/2019. FINDINGS: G-tube noted in stable position. Soft tissue structures are unremarkable. Air-filled loops of small and large bowel noted. No prominent bowel distention. Mild bowel wall thickening cannot be excluded. A process such as enteritis/colitis cannot be excluded. Follow-up exam suggested demonstrate resolution. No free air. Bibasilar atelectasis. IMPRESSION: 1.  G-tube noted stable position. 2. Air-filled loops of small large bowel noted. No prominent bowel distention. Mild bowel wall thickening cannot be excluded. A process such  as enteritis/colitis cannot be excluded. Follow-up exam suggested to demonstrate resolution. 3.  Bibasilar atelectasis. Electronically Signed   By: Marcello Moores  Register   On: 07/31/2019 13:11   DG CHEST PORT 1 VIEW  Result Date: 07/31/2019 CLINICAL DATA:  Congestive heart failure, tracheostomy. EXAM: PORTABLE CHEST 1 VIEW COMPARISON:  07/29/2019 and CT chest 06/09/2019. FINDINGS: The tracheostomy is midline. Pacemaker and ICD lead tips are in the right atrium  and right ventricle. Right PICC tip is in the low SVC. Heart size stable. Worsening mixed interstitial and airspace opacification, right greater than left, with left lower lobe collapse/consolidation. Bilateral pleural effusions, right greater than left. IMPRESSION: Worsening congestive heart failure with left lower lobe collapse/consolidation. Pneumonia cannot be excluded. Electronically Signed   By: Lorin Picket M.D.   On: 07/31/2019 13:11    Assessment/Plan Active Problems:   Acute on chronic respiratory failure with hypoxia (HCC)   Chronic atrial fibrillation ([place])   COVID-19 virus infection   Severe sepsis (HCC)   Multifocal pneumonia   Metabolic encephalopathy   1. Acute on chronic respiratory failure hypoxia continue to wean per protocol. Completed 4 hours today on pressure support continue present management. Will continue aggressive pulmonary toilet supportive measures. 2. Chronic atrial fibrillation rate controlled we will continue to follow. 3. COVID-19 virus infection resolved we will continue with supportive care 4. Severe sepsis resolved 5. Multifocal pneumonia patient's chest x-ray has been showing some improvement we will continue to monitor 6. Metabolic encephalopathy no change   I have personally seen and evaluated the patient, evaluated laboratory and imaging results, formulated the assessment and plan and placed orders. The Patient requires high complexity decision making with multiple systems involvement.  Rounds were done with the Respiratory Therapy Director and Staff therapists and discussed with nursing staff also.  Allyne Gee, MD [facility] Pulmonary Critical Care Medicine Sleep Medicine

## 2019-08-02 DIAGNOSIS — U071 COVID-19: Secondary | ICD-10-CM | POA: Diagnosis not present

## 2019-08-02 DIAGNOSIS — J9621 Acute and chronic respiratory failure with hypoxia: Secondary | ICD-10-CM | POA: Diagnosis not present

## 2019-08-02 DIAGNOSIS — G9341 Metabolic encephalopathy: Secondary | ICD-10-CM | POA: Diagnosis not present

## 2019-08-02 DIAGNOSIS — I482 Chronic atrial fibrillation, unspecified: Secondary | ICD-10-CM | POA: Diagnosis not present

## 2019-08-02 NOTE — Progress Notes (Addendum)
Pulmonary Critical Care Medicine [place]   PULMONARY CRITICAL CARE SERVICE  PROGRESS NOTE  Date of Service: 08/02/2019  Henry Mcbride  [id number]:[id number]  DOB: 07-23-1945   DOA: 06/23/2019  Referring Physician: Merton Border, MD  HPI: Henry Mcbride is a 74 y.o. male seen for follow up of Acute on Chronic Respiratory Failure. Patient has a 6-hour goal today on pressure support currently doing well with no distress.  Medications: Reviewed on Rounds  Physical Exam:  Vitals: Pulse 74 respirations 22 BP 138/70 O2 sat 99% temp 97 point  Ventilator Settings are support 12/5 FiO2 28%  . General: Comfortable at this time . Eyes: Grossly normal lids, irises & conjunctiva . ENT: grossly tongue is normal . Neck: no obvious mass . Cardiovascular: S1 S2 normal no gallop . Respiratory: No rales or rhonchi noted . Abdomen: soft . Skin: no rash seen on limited exam . Musculoskeletal: not rigid . Psychiatric:unable to assess . Neurologic: no seizure no involuntary movements         Lab Data:   Basic Metabolic Panel: Recent Labs  Lab 07/27/19 1514 07/29/19 0708 08/01/19 0634  NA 139 142 138  K 5.5* 4.3 4.3  CL 100 101 102  CO2 32 34* 28  GLUCOSE 97 100* 98  BUN 84* 66* 45*  CREATININE 2.13* 1.55* 1.18  CALCIUM 8.3* 8.2* 8.5*  MG 2.2  --  1.8    ABG: No results for input(s): PHART, PCO2ART, PO2ART, HCO3, O2SAT in the last 168 hours.  Liver Function Tests: No results for input(s): AST, ALT, ALKPHOS, BILITOT, PROT, ALBUMIN in the last 168 hours. No results for input(s): LIPASE, AMYLASE in the last 168 hours. No results for input(s): AMMONIA in the last 168 hours.  CBC: Recent Labs  Lab 07/29/19 0708 08/01/19 0634  WBC 10.8* 10.7*  HGB 7.7* 8.1*  HCT 25.8* 27.1*  MCV 104.9* 104.2*  PLT 173 252    Cardiac Enzymes: No results for input(s): CKTOTAL, CKMB, CKMBINDEX, TROPONINI in the last 168 hours.  BNP (last 3 results) No results for input(s):  BNP in the last 8760 hours.  ProBNP (last 3 results) No results for input(s): PROBNP in the last 8760 hours.  Radiological Exams: DG Abd 1 View  Result Date: 08/01/2019 CLINICAL DATA:  Abnormal white blood cell count. EXAM: ABDOMEN - 1 VIEW COMPARISON:  July 31, 2019. FINDINGS: Gastrostomy tube is in grossly good position. No colonic dilatation is noted. Mildly dilated air-filled small bowel loops are noted in the central and left side of the abdomen concerning for possible focal ileus. IMPRESSION: Mildly dilated air-filled small bowel loops are noted in the central and left side of the abdomen concerning for possible focal ileus. Electronically Signed   By: Marijo Conception M.D.   On: 08/01/2019 12:13   DG Abd 1 View  Result Date: 07/31/2019 CLINICAL DATA:  Abdominal distention. EXAM: ABDOMEN - 1 VIEW COMPARISON:  07/30/2019. FINDINGS: G-tube noted in stable position. Soft tissue structures are unremarkable. Air-filled loops of small and large bowel noted. No prominent bowel distention. Mild bowel wall thickening cannot be excluded. A process such as enteritis/colitis cannot be excluded. Follow-up exam suggested demonstrate resolution. No free air. Bibasilar atelectasis. IMPRESSION: 1.  G-tube noted stable position. 2. Air-filled loops of small large bowel noted. No prominent bowel distention. Mild bowel wall thickening cannot be excluded. A process such as enteritis/colitis cannot be excluded. Follow-up exam suggested to demonstrate resolution. 3.  Bibasilar atelectasis. Electronically Signed  ByMaisie Fus  Register   On: 07/31/2019 13:11   DG CHEST PORT 1 VIEW  Result Date: 07/31/2019 CLINICAL DATA:  Congestive heart failure, tracheostomy. EXAM: PORTABLE CHEST 1 VIEW COMPARISON:  07/29/2019 and CT chest 06/09/2019. 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 Right PICC tip is in the low SVC. Heart size stable. Worsening mixed  interstitial and airspace opacification, right greater than left, with left lower lobe collapse/consolidation. Bilateral pleural effusions, right greater than left. IMPRESSION: Worsening congestive heart failure with left lower lobe collapse/consolidation. Pneumonia cannot be excluded. Electronically Signed   By: Leanna Battles M.D.   On: 07/31/2019 13:11    Assessment/Plan Active Problems:   Acute on chronic respiratory failure with hypoxia (HCC)   Chronic atrial fibrillation (HCC)   COVID-19 virus infection   Severe sepsis (HCC)   Multifocal pneumonia   Metabolic encephalopathy   1. Acute on chronic respiratory failure hypoxia continue to wean on pressure support as tolerated. Continue supportive measures and pulmonary toilet. 2. Chronic atrial fibrillation rate controlled we will continue to follow. 3. COVID-19 virus infection resolved we will continue with supportive care 4. Severe sepsis resolved 5. Multifocal pneumonia patient's chest x-ray has been showing some improvement we will continue to monitor 6. Metabolic encephalopathy no change   I have personally seen and evaluated the patient, evaluated laboratory and imaging results, formulated the assessment and plan and placed orders. The Patient requires high complexity decision making with multiple systems involvement.  Rounds were done with the Respiratory Therapy Director and Staff therapists and discussed with nursing staff also.  Yevonne Pax, MD [facility] Pulmonary Critical Care Medicine Sleep Medicine

## 2019-08-03 DIAGNOSIS — I482 Chronic atrial fibrillation, unspecified: Secondary | ICD-10-CM | POA: Diagnosis not present

## 2019-08-03 DIAGNOSIS — U071 COVID-19: Secondary | ICD-10-CM | POA: Diagnosis not present

## 2019-08-03 DIAGNOSIS — J9621 Acute and chronic respiratory failure with hypoxia: Secondary | ICD-10-CM | POA: Diagnosis not present

## 2019-08-03 DIAGNOSIS — G9341 Metabolic encephalopathy: Secondary | ICD-10-CM | POA: Diagnosis not present

## 2019-08-03 NOTE — Progress Notes (Addendum)
Pulmonary Critical Care Medicine [facility] [place]   PULMONARY CRITICAL CARE SERVICE  PROGRESS NOTE  Date of Service: 08/03/2019  Henry Mcbride  [id number]:[id number]  DOB: 09-28-1945   DOA: 06/23/2019  Referring Physician: Carron Curie, MD  HPI: Henry Mcbride is a 74 y.o. male seen for follow up of Acute on Chronic Respiratory Failure.  Patient remains on pressure support 12/5 and FiO2 of 28% satting well with no distress.  Has made our goal on pressure support.  Medications: Reviewed on Rounds  Physical Exam:  Vitals: Pulse 78 respirations 28 BP 143/67 O2 sat 100% temp 97.1  Ventilator Settings per support 12/5 FiO2 28%  . General: Comfortable at this time . Eyes: Grossly normal lids, irises & conjunctiva . ENT: grossly tongue is normal . Neck: no obvious mass . Cardiovascular: S1 S2 normal no gallop . Respiratory: No rales or rhonchi noted . Abdomen: soft . Skin: no rash seen on limited exam . Musculoskeletal: not rigid . Psychiatric:unable to assess . Neurologic: no seizure no involuntary movements         Lab Data:   Basic Metabolic Panel: Recent Labs  Lab 07/27/19 1514 07/29/19 0708 08/01/19 0634  NA 139 142 138  K 5.5* 4.3 4.3  CL 100 101 102  CO2 32 34* 28  GLUCOSE 97 100* 98  BUN 84* 66* 45*  CREATININE 2.13* 1.55* 1.18  CALCIUM 8.3* 8.2* 8.5*  MG 2.2  --  1.8    ABG: No results for input(s): PHART, PCO2ART, PO2ART, HCO3, O2SAT in the last 168 hours.  Liver Function Tests: No results for input(s): AST, ALT, ALKPHOS, BILITOT, PROT, ALBUMIN in the last 168 hours. No results for input(s): LIPASE, AMYLASE in the last 168 hours. No results for input(s): AMMONIA in the last 168 hours.  CBC: Recent Labs  Lab 07/29/19 0708 08/01/19 0634  WBC 10.8* 10.7*  HGB 7.7* 8.1*  HCT 25.8* 27.1*  MCV 104.9* 104.2*  PLT 173 252    Cardiac Enzymes: No results for input(s): CKTOTAL, CKMB, CKMBINDEX, TROPONINI in the last 168 hours.  BNP (last 3  results) No results for input(s): BNP in the last 8760 hours.  ProBNP (last 3 results) No results for input(s): PROBNP in the last 8760 hours.  Radiological Exams: No results found.  Assessment/Plan Active Problems:   Acute on chronic respiratory failure with hypoxia (HCC)   Chronic atrial fibrillation (HCC)   COVID-19 virus infection   Severe sepsis (HCC)   Multifocal pneumonia   Metabolic encephalopathy   1. Acute on chronic respiratory failure hypoxia  continue towards 8-hour goal on pressure support 12/5 FiO2 28% continue aggressive pulmonary toilet supportive measures. 2. Chronic atrial fibrillation rate controlled we will continue to follow. 3. COVID-19 virus infection resolved we will continue with supportive care 4. Severe sepsis resolved 5. Multifocal pneumonia patient's chest x-ray has been showing some improvement we will continue to monitor 6. Metabolic encephalopathy no change    I have personally seen and evaluated the patient, evaluated laboratory and imaging results, formulated the assessment and plan and placed orders. The Patient requires high complexity decision making with multiple systems involvement.  Rounds were done with the Respiratory Therapy Director and Staff therapists and discussed with nursing staff also.  Yevonne Pax, MD [facility] Pulmonary Critical Care Medicine Sleep Medicine

## 2019-08-04 ENCOUNTER — Other Ambulatory Visit (HOSPITAL_COMMUNITY): Payer: Medicare Other

## 2019-08-04 DIAGNOSIS — J9621 Acute and chronic respiratory failure with hypoxia: Secondary | ICD-10-CM | POA: Diagnosis not present

## 2019-08-04 DIAGNOSIS — I482 Chronic atrial fibrillation, unspecified: Secondary | ICD-10-CM | POA: Diagnosis not present

## 2019-08-04 DIAGNOSIS — G9341 Metabolic encephalopathy: Secondary | ICD-10-CM | POA: Diagnosis not present

## 2019-08-04 DIAGNOSIS — U071 COVID-19: Secondary | ICD-10-CM | POA: Diagnosis not present

## 2019-08-04 LAB — BASIC METABOLIC PANEL
Anion gap: 7 (ref 5–15)
BUN: 39 mg/dL — ABNORMAL HIGH (ref 8–23)
CO2: 25 mmol/L (ref 22–32)
Calcium: 8.4 mg/dL — ABNORMAL LOW (ref 8.9–10.3)
Chloride: 103 mmol/L (ref 98–111)
Creatinine, Ser: 1.25 mg/dL — ABNORMAL HIGH (ref 0.61–1.24)
GFR calc Af Amer: >60 mL/min (ref >60)
GFR calc non Af Amer: 56 mL/min — ABNORMAL LOW (ref >60)
Glucose, Bld: 112 mg/dL — ABNORMAL HIGH (ref 70–99)
Potassium: 4.6 mmol/L (ref 3.5–5.1)
Sodium: 135 mmol/L (ref 135–145)

## 2019-08-04 LAB — CBC
HCT: 29.2 % — ABNORMAL LOW (ref 39.0–52.0)
Hemoglobin: 8.6 g/dL — ABNORMAL LOW (ref 13.0–17.0)
MCH: 30.8 pg (ref 26.0–34.0)
MCHC: 29.5 g/dL — ABNORMAL LOW (ref 30.0–36.0)
MCV: 104.7 fL — ABNORMAL HIGH (ref 80.0–100.0)
Platelets: 297 10*3/uL (ref 150–400)
RBC: 2.79 MIL/uL — ABNORMAL LOW (ref 4.22–5.81)
RDW: 20.3 % — ABNORMAL HIGH (ref 11.5–15.5)
WBC: 12 10*3/uL — ABNORMAL HIGH (ref 4.0–10.5)
nRBC: 0 % (ref 0.0–0.2)

## 2019-08-04 NOTE — Progress Notes (Addendum)
Pulmonary Critical Care Medicine [place]   PULMONARY CRITICAL CARE SERVICE  PROGRESS NOTE  Date of Service: 08/04/2019  Zyir Gassert  [id number]:[id number]  DOB: [birth year]   DOA: 06/23/2019  Referring Physician: Merton Border, MD  HPI: Arvo Ealy is a 74 y.o. male seen for follow up of Acute on Chronic Respiratory Failure.  Patient is now back on full support was able to do 8-1/2 hours on pressure support today currently on 28% FiO2 at a rate of 20.  Medications: Reviewed on Rounds  Physical Exam:  Vitals: pulse 70, resp 29, bp 134/70, o2 sat 97, temp 95.0  Ventilator Settings Vent Mode ACVC rate 20, tv 450, peep 5, fio2 28%  . General: Comfortable at this time . Eyes: Grossly normal lids, irises & conjunctiva . ENT: grossly tongue is normal . Neck: no obvious mass . Cardiovascular: S1 S2 normal no gallop . Respiratory: no rales or ronchi noted . Abdomen: soft . Skin: no rash seen on limited exam . Musculoskeletal: not rigid . Psychiatric:unable to assess . Neurologic: no seizure no involuntary movements         Lab Data:   Basic Metabolic Panel: Recent Labs  Lab 07/29/19 0708 08/01/19 0634 08/04/19 0714  NA 142 138 135  K 4.3 4.3 4.6  CL 101 102 103  CO2 34* 28 25  GLUCOSE 100* 98 112*  BUN 66* 45* 39*  CREATININE 1.55* 1.18 1.25*  CALCIUM 8.2* 8.5* 8.4*  MG  --  1.8  --     ABG: No results for input(s): PHART, PCO2ART, PO2ART, HCO3, O2SAT in the last 168 hours.  Liver Function Tests: No results for input(s): AST, ALT, ALKPHOS, BILITOT, PROT, ALBUMIN in the last 168 hours. No results for input(s): LIPASE, AMYLASE in the last 168 hours. No results for input(s): AMMONIA in the last 168 hours.  CBC: Recent Labs  Lab 07/29/19 0708 08/01/19 0634 08/04/19 0714  WBC 10.8* 10.7* 12.0*  HGB 7.7* 8.1* 8.6*  HCT 25.8* 27.1* 29.2*  MCV 104.9* 104.2* 104.7*  PLT 173 252 297    Cardiac Enzymes: No results for input(s): CKTOTAL, CKMB,  CKMBINDEX, TROPONINI in the last 168 hours.  BNP (last 3 results) No results for input(s): BNP in the last 8760 hours.  ProBNP (last 3 results) No results for input(s): PROBNP in the last 8760 hours.  Radiological Exams: DG Abd 1 View  Result Date: 08/04/2019 CLINICAL DATA:  Ileus EXAM: ABDOMEN - 1 VIEW COMPARISON:  Three days ago FINDINGS: No dilated bowel loops. Percutaneous gastrostomy tube in expected position. No concerning mass effect or gas collection. IMPRESSION: Normal bowel gas pattern. Electronically Signed   By: Monte Fantasia M.D.   On: 08/04/2019 06:22    Assessment/Plan Active Problems:   Acute on chronic respiratory failure with hypoxia (HCC)   Chronic atrial fibrillation ([place])   COVID-19 virus infection   Severe sepsis (HCC)   Multifocal pneumonia   Metabolic encephalopathy   1. Acute on chronic respiratory failure hypoxiapatient was unable to complete 12-hour goal today on the last day and 1/2 hours on pressure support is now resting back on full support on the ventilator.  Continue supportive measures and pulmonary toilet continue to attempt weaning as tolerated. 2. Chronic atrial fibrillation rate controlled we will continue to follow. 3. COVID-19 virus infection resolved we will continue with supportive care 4. Severe sepsis resolved 5. Multifocal pneumonia patient's chest x-ray has been showing some improvement we will continue to monitor 6. Metabolic  encephalopathy no change   I have personally seen and evaluated the patient, evaluated laboratory and imaging results, formulated the assessment and plan and placed orders. The Patient requires high complexity decision making with multiple systems involvement.  Rounds were done with the Respiratory Therapy Director and Staff therapists and discussed with nursing staff also.  Yevonne Pax, MD [facility] Pulmonary Critical Care Medicine Sleep Medicine

## 2019-08-05 DIAGNOSIS — I482 Chronic atrial fibrillation, unspecified: Secondary | ICD-10-CM | POA: Diagnosis not present

## 2019-08-05 DIAGNOSIS — J9621 Acute and chronic respiratory failure with hypoxia: Secondary | ICD-10-CM | POA: Diagnosis not present

## 2019-08-05 DIAGNOSIS — U071 COVID-19: Secondary | ICD-10-CM | POA: Diagnosis not present

## 2019-08-05 DIAGNOSIS — G9341 Metabolic encephalopathy: Secondary | ICD-10-CM | POA: Diagnosis not present

## 2019-08-05 NOTE — Progress Notes (Addendum)
Pulmonary Critical Care Medicine [facility] [place]   PULMONARY CRITICAL CARE SERVICE  PROGRESS NOTE  Date of Service: 08/05/2019  Quandre Polinski  YQM:[id number]  DOB: [birth year]   DOA: 06/23/2019  Referring Physician: Carron Curie, MD  HPI: Amere Bricco is a 74 y.o. male seen for follow up of Acute on Chronic Respiratory Failure. Patient remains on psp at this time.  No fever or distress noted.   Medications: Reviewed on Rounds  Physical Exam:  Vitals: pulse 71, resp 18, bp 120/62, o2 sat 100, temp 96.3  Ventilator Settings psp 12/5 28%  . General: Comfortable at this time . Eyes: Grossly normal lids, irises & conjunctiva . ENT: grossly tongue is normal . Neck: no obvious mass . Cardiovascular: S1 S2 normal no gallop . Respiratory: no rales or ronchi noted . Abdomen: soft . Skin: no rash seen on limited exam . Musculoskeletal: not rigid . Psychiatric:unable to assess . Neurologic: no seizure no involuntary movements         Lab Data:   Basic Metabolic Panel: Recent Labs  Lab 08/01/19 0634 08/04/19 0714  NA 138 135  K 4.3 4.6  CL 102 103  CO2 28 25  GLUCOSE 98 112*  BUN 45* 39*  CREATININE 1.18 1.25*  CALCIUM 8.5* 8.4*  MG 1.8  --     ABG: No results for input(s): PHART, PCO2ART, PO2ART, HCO3, O2SAT in the last 168 hours.  Liver Function Tests: No results for input(s): AST, ALT, ALKPHOS, BILITOT, PROT, ALBUMIN in the last 168 hours. No results for input(s): LIPASE, AMYLASE in the last 168 hours. No results for input(s): AMMONIA in the last 168 hours.  CBC: Recent Labs  Lab 08/01/19 0634 08/04/19 0714  WBC 10.7* 12.0*  HGB 8.1* 8.6*  HCT 27.1* 29.2*  MCV 104.2* 104.7*  PLT 252 297    Cardiac Enzymes: No results for input(s): CKTOTAL, CKMB, CKMBINDEX, TROPONINI in the last 168 hours.  BNP (last 3 results) No results for input(s): BNP in the last 8760 hours.  ProBNP (last 3 results) No results for input(s): PROBNP in the last  8760 hours.  Radiological Exams: DG Abd 1 View  Result Date: 08/04/2019 CLINICAL DATA:  Ileus EXAM: ABDOMEN - 1 VIEW COMPARISON:  Three days ago FINDINGS: No dilated bowel loops. Percutaneous gastrostomy tube in expected position. No concerning mass effect or gas collection. IMPRESSION: Normal bowel gas pattern. Electronically Signed   By: Marnee Spring M.D.   On: 08/04/2019 06:22    Assessment/Plan Active Problems:   Acute on chronic respiratory failure with hypoxia (HCC)   Chronic atrial fibrillation (HCC)   COVID-19 virus infection   Severe sepsis (HCC)   Multifocal pneumonia   Metabolic encephalopathy   1. Acute on chronic respiratory failure hypoxia  patient remains on pressure support at this time currently on 28% satting well no fever distress 2. Chronic atrial fibrillation rate controlled we will continue to follow. 3. COVID-19 virus infection resolved we will continue with supportive care 4. Severe sepsis resolved 5. Multifocal pneumonia patient's chest x-ray has been showing some improvement we will continue to monitor 6. Metabolic encephalopathy no change   I have personally seen and evaluated the patient, evaluated laboratory and imaging results, formulated the assessment and plan and placed orders. The Patient requires high complexity decision making with multiple systems involvement.  Rounds were done with the Respiratory Therapy Director and Staff therapists and discussed with nursing staff also.  Yevonne Pax, MD [facility] Pulmonary Critical Care  Medicine Sleep Medicine

## 2019-08-06 DIAGNOSIS — I482 Chronic atrial fibrillation, unspecified: Secondary | ICD-10-CM | POA: Diagnosis not present

## 2019-08-06 DIAGNOSIS — U071 COVID-19: Secondary | ICD-10-CM | POA: Diagnosis not present

## 2019-08-06 DIAGNOSIS — G9341 Metabolic encephalopathy: Secondary | ICD-10-CM | POA: Diagnosis not present

## 2019-08-06 DIAGNOSIS — J9621 Acute and chronic respiratory failure with hypoxia: Secondary | ICD-10-CM | POA: Diagnosis not present

## 2019-08-06 LAB — BASIC METABOLIC PANEL
Anion gap: 8 (ref 5–15)
BUN: 53 mg/dL — ABNORMAL HIGH (ref 8–23)
CO2: 22 mmol/L (ref 22–32)
Calcium: 8.5 mg/dL — ABNORMAL LOW (ref 8.9–10.3)
Chloride: 106 mmol/L (ref 98–111)
Creatinine, Ser: 1.23 mg/dL (ref 0.61–1.24)
GFR calc Af Amer: >60 mL/min (ref >60)
GFR calc non Af Amer: 57 mL/min — ABNORMAL LOW (ref >60)
Glucose, Bld: 97 mg/dL (ref 70–99)
Potassium: 5.1 mmol/L (ref 3.5–5.1)
Sodium: 136 mmol/L (ref 135–145)

## 2019-08-06 LAB — CBC
HCT: 26.3 % — ABNORMAL LOW (ref 39.0–52.0)
Hemoglobin: 7.9 g/dL — ABNORMAL LOW (ref 13.0–17.0)
MCH: 30.9 pg (ref 26.0–34.0)
MCHC: 30 g/dL (ref 30.0–36.0)
MCV: 102.7 fL — ABNORMAL HIGH (ref 80.0–100.0)
Platelets: 232 10*3/uL (ref 150–400)
RBC: 2.56 MIL/uL — ABNORMAL LOW (ref 4.22–5.81)
RDW: 20.4 % — ABNORMAL HIGH (ref 11.5–15.5)
WBC: 12.4 10*3/uL — ABNORMAL HIGH (ref 4.0–10.5)
nRBC: 0 % (ref 0.0–0.2)

## 2019-08-06 NOTE — Progress Notes (Addendum)
Pulmonary Critical Care Medicine [facility] [place]   PULMONARY CRITICAL CARE SERVICE  PROGRESS NOTE  Date of Service: 08/06/2019  Boruch Manuele  [id number]:[id number]  DOB: 1946/01/04   DOA: 06/23/2019  Referring Physician: Carron Curie, MD  HPI: Zykee Avakian is a 74 y.o. male seen for follow up of Acute on Chronic Respiratory Failure.  Patient 16-hour goal today on pressure support currently on 30% FiO2 satting well no fever distress  Medications: Reviewed on Rounds  Physical Exam:  Vitals: Pulse 70 respirations 30 BP 110/60 O2 sat 100% temp 97.0  Ventilator Settings pressure support 12/5 FiO2 30%  . General: Comfortable at this time . Eyes: Grossly normal lids, irises & conjunctiva . ENT: grossly tongue is normal . Neck: no obvious mass . Cardiovascular: S1 S2 normal no gallop . Respiratory: No rales or rhonchi noted . Abdomen: soft . Skin: no rash seen on limited exam . Musculoskeletal: not rigid . Psychiatric:unable to assess . Neurologic: no seizure no involuntary movements         Lab Data:   Basic Metabolic Panel: Recent Labs  Lab 08/01/19 0634 08/04/19 0714 08/06/19 0659  NA 138 135 136  K 4.3 4.6 5.1  CL 102 103 106  CO2 28 25 22   GLUCOSE 98 112* 97  BUN 45* 39* 53*  CREATININE 1.18 1.25* 1.23  CALCIUM 8.5* 8.4* 8.5*  MG 1.8  --   --     ABG: No results for input(s): PHART, PCO2ART, PO2ART, HCO3, O2SAT in the last 168 hours.  Liver Function Tests: No results for input(s): AST, ALT, ALKPHOS, BILITOT, PROT, ALBUMIN in the last 168 hours. No results for input(s): LIPASE, AMYLASE in the last 168 hours. No results for input(s): AMMONIA in the last 168 hours.  CBC: Recent Labs  Lab 08/01/19 0634 08/04/19 0714 08/06/19 0659  WBC 10.7* 12.0* 12.4*  HGB 8.1* 8.6* 7.9*  HCT 27.1* 29.2* 26.3*  MCV 104.2* 104.7* 102.7*  PLT 252 297 232    Cardiac Enzymes: No results for input(s): CKTOTAL, CKMB, CKMBINDEX, TROPONINI in the last 168  hours.  BNP (last 3 results) No results for input(s): BNP in the last 8760 hours.  ProBNP (last 3 results) No results for input(s): PROBNP in the last 8760 hours.  Radiological Exams: No results found.  Assessment/Plan Active Problems:   Acute on chronic respiratory failure with hypoxia (HCC)   Chronic atrial fibrillation (HCC)   COVID-19 virus infection   Severe sepsis (HCC)   Multifocal pneumonia   Metabolic encephalopathy   1. Acute on chronic respiratory failure hypoxiapatient 16-hour goal today on pressure support will continue at this time.  Continue supportive measures and pulmonary 2. Chronic atrial fibrillation rate controlled we will continue to follow. 3. COVID-19 virus infection resolved we will continue with supportive care 4. Severe sepsis resolved 5. Multifocal pneumonia patient's chest x-ray has been showing some improvement we will continue to monitor 6. Metabolic encephalopathy no change   I have personally seen and evaluated the patient, evaluated laboratory and imaging results, formulated the assessment and plan and placed orders. The Patient requires high complexity decision making with multiple systems involvement.  Rounds were done with the Respiratory Therapy Director and Staff therapists and discussed with nursing staff also.  08/08/19, MD [facility] Pulmonary Critical Care Medicine Sleep Medicine

## 2019-08-07 DIAGNOSIS — G9341 Metabolic encephalopathy: Secondary | ICD-10-CM | POA: Diagnosis not present

## 2019-08-07 DIAGNOSIS — I482 Chronic atrial fibrillation, unspecified: Secondary | ICD-10-CM | POA: Diagnosis not present

## 2019-08-07 DIAGNOSIS — J9621 Acute and chronic respiratory failure with hypoxia: Secondary | ICD-10-CM | POA: Diagnosis not present

## 2019-08-07 DIAGNOSIS — U071 COVID-19: Secondary | ICD-10-CM | POA: Diagnosis not present

## 2019-08-07 LAB — NOVEL CORONAVIRUS, NAA (HOSP ORDER, SEND-OUT TO REF LAB; TAT 18-24 HRS): SARS-CoV-2, NAA: NOT DETECTED

## 2019-08-07 NOTE — Progress Notes (Addendum)
Pulmonary Critical Care Medicine [facility] [place]   PULMONARY CRITICAL CARE SERVICE  PROGRESS NOTE  Date of Service: 08/07/2019  [name]  [id number]:[id number]  DOB: November 22, 1945   DOA: 06/23/2019  Referring Physician: Carron Curie, MD  HPI: Henry Mcbride is a 74 y.o. male seen for follow up of Acute on Chronic Respiratory Failure.  Patient did 20 minutes today on pressure support before having to go back on full support on the ventilator currently on 30% FiO2 satting well no distress  Medications: Reviewed on Rounds  Physical Exam:  Vitals: Pulse 71 respirations 16 BP 102/50 O2 sat 95% temp 95.9  Ventilator Settings: Mode AC VC rate of 20 tidal volume 450 PEEP of 5 and FiO2 30%  . General: Comfortable at this time . Eyes: Grossly normal lids, irises & conjunctiva . ENT: grossly tongue is normal . Neck: no obvious mass . Cardiovascular: S1 S2 normal no gallop . Respiratory: No rales or rhonchi noted . Abdomen: soft . Skin: no rash seen on limited exam . Musculoskeletal: not rigid . Psychiatric:unable to assess . Neurologic: no seizure no involuntary movements         Lab Data:   Basic Metabolic Panel: Recent Labs  Lab 08/01/19 0634 08/04/19 0714 08/06/19 0659  NA 138 135 136  K 4.3 4.6 5.1  CL 102 103 106  CO2 28 25 22   GLUCOSE 98 112* 97  BUN 45* 39* 53*  CREATININE 1.18 1.25* 1.23  CALCIUM 8.5* 8.4* 8.5*  MG 1.8  --   --     ABG: No results for input(s): PHART, PCO2ART, PO2ART, HCO3, O2SAT in the last 168 hours.  Liver Function Tests: No results for input(s): AST, ALT, ALKPHOS, BILITOT, PROT, ALBUMIN in the last 168 hours. No results for input(s): LIPASE, AMYLASE in the last 168 hours. No results for input(s): AMMONIA in the last 168 hours.  CBC: Recent Labs  Lab 08/01/19 0634 08/04/19 0714 08/06/19 0659  WBC 10.7* 12.0* 12.4*  HGB 8.1* 8.6* 7.9*  HCT 27.1* 29.2* 26.3*  MCV 104.2* 104.7* 102.7*  PLT 252 297 232    Cardiac  Enzymes: No results for input(s): CKTOTAL, CKMB, CKMBINDEX, TROPONINI in the last 168 hours.  BNP (last 3 results) No results for input(s): BNP in the last 8760 hours.  ProBNP (last 3 results) No results for input(s): PROBNP in the last 8760 hours.  Radiological Exams: No results found.  Assessment/Plan Active Problems:   Acute on chronic respiratory failure with hypoxia (HCC)   Chronic atrial fibrillation (HCC)   COVID-19 virus infection   Severe sepsis (HCC)   Multifocal pneumonia   Metabolic encephalopathy   1. Acute on chronic respiratory failure hypoxiapatient continues to do well at this time currently on full support ventilator unable to wean to pressure support more than 20 minutes today.  We will continue supportive measures and pulmonary toilet.  Continue to attempt weaning as tolerated. 2. Chronic atrial fibrillation rate controlled we will continue to follow. 3. COVID-19 virus infection resolved we will continue with supportive care 4. Severe sepsis resolved 5. Multifocal pneumonia patient's chest x-ray has been showing some improvement we will continue to monitor 6. Metabolic encephalopathy no change   I have personally seen and evaluated the patient, evaluated laboratory and imaging results, formulated the assessment and plan and placed orders. The Patient requires high complexity decision making with multiple systems involvement.  Rounds were done with the Respiratory Therapy Director and Staff therapists and discussed with nursing staff  also.  Allyne Gee, MD [facility] Pulmonary Critical Care Medicine Sleep Medicine

## 2019-08-08 DIAGNOSIS — J9621 Acute and chronic respiratory failure with hypoxia: Secondary | ICD-10-CM | POA: Diagnosis not present

## 2019-08-08 DIAGNOSIS — U071 COVID-19: Secondary | ICD-10-CM | POA: Diagnosis not present

## 2019-08-08 DIAGNOSIS — G9341 Metabolic encephalopathy: Secondary | ICD-10-CM | POA: Diagnosis not present

## 2019-08-08 DIAGNOSIS — I482 Chronic atrial fibrillation, unspecified: Secondary | ICD-10-CM | POA: Diagnosis not present

## 2019-08-08 LAB — POTASSIUM: Potassium: 5.1 mmol/L (ref 3.5–5.1)

## 2019-08-08 NOTE — Progress Notes (Addendum)
Pulmonary Critical Care Medicine [facility] [place]   PULMONARY CRITICAL CARE SERVICE  PROGRESS NOTE  Date of Service: 08/08/2019  Romney Compean  XKG:[id number]  DOB: 05/27/1945   DOA: 06/23/2019  Referring Physician: Carron Curie, MD  HPI: Kevaughn Ewing is a 74 y.o. male seen for follow up of Acute on Chronic Respiratory Failure.  Patient remains on full support at this time currently rate 20 with an FiO2 of 30% failed weaning today.  Medications: Reviewed on Rounds  Physical Exam:  Vitals: Pulse 71 respirations 20 BP 121/62 O2 sat 100% temp 95.3   Ventilator Settings ventilator mode AC VC rate of 20 tidal volume 450 PEEP of 5 and FiO2 30%  . General: Comfortable at this time . Eyes: Grossly normal lids, irises & conjunctiva . ENT: grossly tongue is normal . Neck: no obvious mass . Cardiovascular: S1 S2 normal no gallop . Respiratory: No rales or rhonchi noted . Abdomen: soft . Skin: no rash seen on limited exam . Musculoskeletal: not rigid . Psychiatric:unable to assess . Neurologic: no seizure no involuntary movements         Lab Data:   Basic Metabolic Panel: Recent Labs  Lab 08/04/19 0714 08/06/19 0659 08/08/19 0548  NA 135 136  --   K 4.6 5.1 5.1  CL 103 106  --   CO2 25 22  --   GLUCOSE 112* 97  --   BUN 39* 53*  --   CREATININE 1.25* 1.23  --   CALCIUM 8.4* 8.5*  --     ABG: No results for input(s): PHART, PCO2ART, PO2ART, HCO3, O2SAT in the last 168 hours.  Liver Function Tests: No results for input(s): AST, ALT, ALKPHOS, BILITOT, PROT, ALBUMIN in the last 168 hours. No results for input(s): LIPASE, AMYLASE in the last 168 hours. No results for input(s): AMMONIA in the last 168 hours.  CBC: Recent Labs  Lab 08/04/19 0714 08/06/19 0659  WBC 12.0* 12.4*  HGB 8.6* 7.9*  HCT 29.2* 26.3*  MCV 104.7* 102.7*  PLT 297 232    Cardiac Enzymes: No results for input(s): CKTOTAL, CKMB, CKMBINDEX, TROPONINI in the last 168 hours.  BNP  (last 3 results) No results for input(s): BNP in the last 8760 hours.  ProBNP (last 3 results) No results for input(s): PROBNP in the last 8760 hours.  Radiological Exams: No results found.  Assessment/Plan Active Problems:   Acute on chronic respiratory failure with hypoxia (HCC)   Chronic atrial fibrillation (HCC)   COVID-19 virus infection   Severe sepsis (HCC)   Multifocal pneumonia   Metabolic encephalopathy   1. Acute on chronic respiratory failure hypoxiapatient will remain on full support ventilator we will continue to attempt weaning as tolerated.  Continue supportive measures and aggressive pulmonary toilet. 2. Chronic atrial fibrillation rate controlled we will continue to follow. 3. COVID-19 virus infection resolved we will continue with supportive care 4. Severe sepsis resolved 5. Multifocal pneumonia patient's chest x-ray has been showing some improvement we will continue to monitor 6. Metabolic encephalopathy no change   I have personally seen and evaluated the patient, evaluated laboratory and imaging results, formulated the assessment and plan and placed orders. The Patient requires high complexity decision making with multiple systems involvement.  Rounds were done with the Respiratory Therapy Director and Staff therapists and discussed with nursing staff also.  Yevonne Pax, MD [facility] Pulmonary Critical Care Medicine Sleep Medicine

## 2019-08-09 ENCOUNTER — Other Ambulatory Visit (HOSPITAL_COMMUNITY): Payer: Medicare Other

## 2019-08-09 DIAGNOSIS — G9341 Metabolic encephalopathy: Secondary | ICD-10-CM | POA: Diagnosis not present

## 2019-08-09 DIAGNOSIS — J9621 Acute and chronic respiratory failure with hypoxia: Secondary | ICD-10-CM | POA: Diagnosis not present

## 2019-08-09 DIAGNOSIS — I482 Chronic atrial fibrillation, unspecified: Secondary | ICD-10-CM | POA: Diagnosis not present

## 2019-08-09 DIAGNOSIS — U071 COVID-19: Secondary | ICD-10-CM | POA: Diagnosis not present

## 2019-08-09 NOTE — Progress Notes (Signed)
Pulmonary Critical Care Medicine [facility] [place]   PULMONARY CRITICAL CARE SERVICE  PROGRESS NOTE  Date of Service: 08/09/2019  Henry Mcbride  NID:[id number]  DOB: October 01, 1945   DOA: 06/23/2019  Referring Physician: Carron Curie, MD  HPI: Henry Mcbride is a 74 y.o. male seen for follow up of Acute on Chronic Respiratory Failure.  Patient at this time is on pressure support mode has been on 16-hour goal  Medications: Reviewed on Rounds  Physical Exam:  Vitals: Temperature 97.0 pulse 86 respiratory rate 20 blood pressure is 106/57 saturations 100%  Ventilator Settings mode ventilation pressure support FiO2 is 45% pressure poor 12 PEEP 5  . General: Comfortable at this time . Eyes: Grossly normal lids, irises & conjunctiva . ENT: grossly tongue is normal . Neck: no obvious mass . Cardiovascular: S1 S2 normal no gallop . Respiratory: No rhonchi coarse breath sounds . Abdomen: soft . Skin: no rash seen on limited exam . Musculoskeletal: not rigid . Psychiatric:unable to assess . Neurologic: no seizure no involuntary movements         Lab Data:   Basic Metabolic Panel: Recent Labs  Lab 08/04/19 0714 08/06/19 0659 08/08/19 0548  NA 135 136  --   K 4.6 5.1 5.1  CL 103 106  --   CO2 25 22  --   GLUCOSE 112* 97  --   BUN 39* 53*  --   CREATININE 1.25* 1.23  --   CALCIUM 8.4* 8.5*  --     ABG: No results for input(s): PHART, PCO2ART, PO2ART, HCO3, O2SAT in the last 168 hours.  Liver Function Tests: No results for input(s): AST, ALT, ALKPHOS, BILITOT, PROT, ALBUMIN in the last 168 hours. No results for input(s): LIPASE, AMYLASE in the last 168 hours. No results for input(s): AMMONIA in the last 168 hours.  CBC: Recent Labs  Lab 08/04/19 0714 08/06/19 0659  WBC 12.0* 12.4*  HGB 8.6* 7.9*  HCT 29.2* 26.3*  MCV 104.7* 102.7*  PLT 297 232    Cardiac Enzymes: No results for input(s): CKTOTAL, CKMB, CKMBINDEX, TROPONINI in the last 168  hours.  BNP (last 3 results) No results for input(s): BNP in the last 8760 hours.  ProBNP (last 3 results) No results for input(s): PROBNP in the last 8760 hours.  Radiological Exams: DG CHEST PORT 1 VIEW  Result Date: 08/09/2019 CLINICAL DATA:  74 year old male with a history of respiratory failure EXAM: PORTABLE CHEST 1 VIEW COMPARISON:  07/31/2019, 07/29/2019 FINDINGS: Cardiomediastinal silhouette unchanged in size and contour. Right heart border again obscured by overlying lung/pleural disease. The upper left heart border also obscured by lung/pleural disease, unchanged. Opacity at the right lung base obscuring the right hemidiaphragm, unchanged. Blunting of the left costophrenic angle. Tracheostomy tube is unchanged. Cardiac pacing device/AICD, unchanged. No pneumothorax. IMPRESSION: Unchanged appearance of the chest x-ray compared to 07/31/2019, with bilateral right greater than left mixed interstitial and airspace opacities, and bilateral basilar opacities, with the differential diagnosis including a combination of pleural fluid, edema, multifocal pneumonia, and/or chronic changes/atelectasis. Unchanged tracheostomy and left cardiac AICD. Electronically Signed   By: Gilmer Mor D.O.   On: 08/09/2019 14:22    Assessment/Plan Active Problems:   Acute on chronic respiratory failure with hypoxia (HCC)   Chronic atrial fibrillation (HCC)   COVID-19 virus infection   Severe sepsis (HCC)   Multifocal pneumonia   Metabolic encephalopathy   1. Acute on chronic respiratory failure with hypoxia patient is going to continue with pressure support  will titrate oxygen as tolerated patient's goal is 16 hours 2. Chronic atrial fibrillation rate controlled 3. COVID-19 virus infection treated resolved 4. Severe sepsis resolved 5. Multifocal pneumonia at baseline we will continue to follow 6. Metabolic encephalopathy patient is at baseline   I have personally seen and evaluated the patient,  evaluated laboratory and imaging results, formulated the assessment and plan and placed orders. The Patient requires high complexity decision making with multiple systems involvement.  Rounds were done with the Respiratory Therapy Director and Staff therapists and discussed with nursing staff also.  Yevonne Pax, MD [facility] Pulmonary Critical Care Medicine Sleep Medicine

## 2019-08-10 DIAGNOSIS — U071 COVID-19: Secondary | ICD-10-CM | POA: Diagnosis not present

## 2019-08-10 DIAGNOSIS — I482 Chronic atrial fibrillation, unspecified: Secondary | ICD-10-CM | POA: Diagnosis not present

## 2019-08-10 DIAGNOSIS — G9341 Metabolic encephalopathy: Secondary | ICD-10-CM | POA: Diagnosis not present

## 2019-08-10 DIAGNOSIS — J9621 Acute and chronic respiratory failure with hypoxia: Secondary | ICD-10-CM | POA: Diagnosis not present

## 2019-08-10 LAB — POTASSIUM: Potassium: 5.4 mmol/L — ABNORMAL HIGH (ref 3.5–5.1)

## 2019-08-10 NOTE — Progress Notes (Signed)
Pulmonary Critical Care Medicine [facility] [place]   PULMONARY CRITICAL CARE SERVICE  PROGRESS NOTE  Date of Service: 08/10/2019  Henry Mcbride  NLG:[id number]  DOB: [birth year]   DOA: 06/23/2019  Referring Physician: Carron Curie, MD  HPI: Henry Mcbride is a 74 y.o. male seen for follow up of Acute on Chronic Respiratory Failure.  Patient currently is on pressure support has been on 45% FiO2 apparently yesterday did 9 hours of pressure support so plan is going to be to continue to advance up to 12 hours or beyond  Medications: Reviewed on Rounds  Physical Exam:  Vitals: Temperature 96.2 pulse 70 respiratory rate 22 blood pressure is 105/65 saturations 100%  Ventilator Settings on pressure support FiO2 45% pressure poor 12/5   General: Comfortable at this time  Eyes: Grossly normal lids, irises & conjunctiva  ENT: grossly tongue is normal  Neck: no obvious mass  Cardiovascular: S1 S2 normal no gallop  Respiratory: No rhonchi coarse breath sounds are noted  Abdomen: soft  Skin: no rash seen on limited exam  Musculoskeletal: not rigid  Psychiatric:unable to assess  Neurologic: no seizure no involuntary movements         Lab Data:   Basic Metabolic Panel: Recent Labs  Lab 08/04/19 0714 08/06/19 0659 08/08/19 0548 08/10/19 0500  NA 135 136  --   --   K 4.6 5.1 5.1 5.4*  CL 103 106  --   --   CO2 25 22  --   --   GLUCOSE 112* 97  --   --   BUN 39* 53*  --   --   CREATININE 1.25* 1.23  --   --   CALCIUM 8.4* 8.5*  --   --     ABG: No results for input(s): PHART, PCO2ART, PO2ART, HCO3, O2SAT in the last 168 hours.  Liver Function Tests: No results for input(s): AST, ALT, ALKPHOS, BILITOT, PROT, ALBUMIN in the last 168 hours. No results for input(s): LIPASE, AMYLASE in the last 168 hours. No results for input(s): AMMONIA in the last 168 hours.  CBC: Recent Labs  Lab 08/04/19 0714 08/06/19 0659  WBC 12.0* 12.4*  HGB 8.6* 7.9*  HCT  29.2* 26.3*  MCV 104.7* 102.7*  PLT 297 232    Cardiac Enzymes: No results for input(s): CKTOTAL, CKMB, CKMBINDEX, TROPONINI in the last 168 hours.  BNP (last 3 results) No results for input(s): BNP in the last 8760 hours.  ProBNP (last 3 results) No results for input(s): PROBNP in the last 8760 hours.  Radiological Exams: DG CHEST PORT 1 VIEW  Result Date: 08/09/2019 CLINICAL DATA:  74 year old male with a history of respiratory failure EXAM: PORTABLE CHEST 1 VIEW COMPARISON:  07/31/2019, 07/29/2019 FINDINGS: Cardiomediastinal silhouette unchanged in size and contour. Right heart border again obscured by overlying lung/pleural disease. The upper left heart border also obscured by lung/pleural disease, unchanged. Opacity at the right lung base obscuring the right hemidiaphragm, unchanged. Blunting of the left costophrenic angle. Tracheostomy tube is unchanged. Cardiac pacing device/AICD, unchanged. No pneumothorax. IMPRESSION: Unchanged appearance of the chest x-ray compared to 07/31/2019, with bilateral right greater than left mixed interstitial and airspace opacities, and bilateral basilar opacities, with the differential diagnosis including a combination of pleural fluid, edema, multifocal pneumonia, and/or chronic changes/atelectasis. Unchanged tracheostomy and left cardiac AICD. Electronically Signed   By: Gilmer Mor D.O.   On: 08/09/2019 14:22    Assessment/Plan Active Problems:   Acute on chronic respiratory failure with hypoxia (  HCC)   Chronic atrial fibrillation (HCC)   COVID-19 virus infection   Severe sepsis (HCC)   Multifocal pneumonia   Metabolic encephalopathy   1. Acute on chronic respiratory failure with hypoxia we will continue with weaning on pressure support 12/5 advance to goal as mentioned above. 2. Chronic atrial fibrillation rate controlled 3. COVID-19 virus infection in resolution phase 4. Severe sepsis resolved 5. Multifocal pneumonia treated chest x-ray  still showing significant airspace opacities likely as a result of the Covid 6. Metabolic encephalopathy no change   I have personally seen and evaluated the patient, evaluated laboratory and imaging results, formulated the assessment and plan and placed orders. The Patient requires high complexity decision making with multiple systems involvement.  Rounds were done with the Respiratory Therapy Director and Staff therapists and discussed with nursing staff also.  Yevonne Pax, MD [facility] Pulmonary Critical Care Medicine Sleep Medicine

## 2019-08-11 DIAGNOSIS — U071 COVID-19: Secondary | ICD-10-CM | POA: Diagnosis not present

## 2019-08-11 DIAGNOSIS — I482 Chronic atrial fibrillation, unspecified: Secondary | ICD-10-CM | POA: Diagnosis not present

## 2019-08-11 DIAGNOSIS — G9341 Metabolic encephalopathy: Secondary | ICD-10-CM | POA: Diagnosis not present

## 2019-08-11 DIAGNOSIS — J9621 Acute and chronic respiratory failure with hypoxia: Secondary | ICD-10-CM | POA: Diagnosis not present

## 2019-08-11 LAB — CBC WITH DIFFERENTIAL/PLATELET
Abs Immature Granulocytes: 0.05 10*3/uL (ref 0.00–0.07)
Basophils Absolute: 0 10*3/uL (ref 0.0–0.1)
Basophils Relative: 0 %
Eosinophils Absolute: 0.2 10*3/uL (ref 0.0–0.5)
Eosinophils Relative: 2 %
HCT: 25 % — ABNORMAL LOW (ref 39.0–52.0)
Hemoglobin: 7.4 g/dL — ABNORMAL LOW (ref 13.0–17.0)
Immature Granulocytes: 1 %
Lymphocytes Relative: 13 %
Lymphs Abs: 1.4 10*3/uL (ref 0.7–4.0)
MCH: 31.2 pg (ref 26.0–34.0)
MCHC: 29.6 g/dL — ABNORMAL LOW (ref 30.0–36.0)
MCV: 105.5 fL — ABNORMAL HIGH (ref 80.0–100.0)
Monocytes Absolute: 0.6 10*3/uL (ref 0.1–1.0)
Monocytes Relative: 5 %
Neutro Abs: 8.6 10*3/uL — ABNORMAL HIGH (ref 1.7–7.7)
Neutrophils Relative %: 79 %
Platelets: 210 10*3/uL (ref 150–400)
RBC: 2.37 MIL/uL — ABNORMAL LOW (ref 4.22–5.81)
RDW: 19.8 % — ABNORMAL HIGH (ref 11.5–15.5)
WBC: 10.8 10*3/uL — ABNORMAL HIGH (ref 4.0–10.5)
nRBC: 0 % (ref 0.0–0.2)

## 2019-08-11 LAB — BLOOD GAS, VENOUS
Acid-Base Excess: 6.4 mmol/L — ABNORMAL HIGH (ref 0.0–2.0)
Bicarbonate: 33 mmol/L — ABNORMAL HIGH (ref 20.0–28.0)
FIO2: 30
O2 Saturation: 98.3 %
Patient temperature: 37
pCO2, Ven: 73.7 mmHg (ref 44.0–60.0)
pH, Ven: 7.273 (ref 7.250–7.430)
pO2, Ven: 114.1 mmHg — ABNORMAL HIGH (ref 32.0–45.0)

## 2019-08-11 LAB — AMMONIA: Ammonia: 34 umol/L (ref 9–35)

## 2019-08-11 LAB — TSH: TSH: 3.717 u[IU]/mL (ref 0.350–4.500)

## 2019-08-11 LAB — POTASSIUM: Potassium: 4.8 mmol/L (ref 3.5–5.1)

## 2019-08-11 NOTE — Progress Notes (Signed)
Pulmonary Critical Care Medicine [facility] [place]   PULMONARY CRITICAL CARE SERVICE  PROGRESS NOTE  Date of Service: 08/11/2019  Little Winton  HAL:[id number]  DOB: 1945-09-08   DOA: 06/23/2019  Referring Physician: Carron Curie, MD  HPI: Henry Mcbride is a 74 y.o. male seen for follow up of Acute on Chronic Respiratory Failure.  Patient is off the ventilator right now on pressure support has been on 30% FiO2 with respiratory 12/5 goal is 16 hours  Medications: Reviewed on Rounds  Physical Exam:  Vitals: Temperature is 95.2 pulse 70 respiratory 20 blood pressure is 108/70 saturations 100%  Ventilator Settings on pressure support FiO2 30% pressure support 12 PEEP 5   General: Comfortable at this time  Eyes: Grossly normal lids, irises & conjunctiva  ENT: grossly tongue is normal  Neck: no obvious mass  Cardiovascular: S1 S2 normal no gallop  Respiratory: No rhonchi coarse breath sounds  Abdomen: soft  Skin: no rash seen on limited exam  Musculoskeletal: not rigid  Psychiatric:unable to assess  Neurologic: no seizure no involuntary movements         Lab Data:   Basic Metabolic Panel: Recent Labs  Lab 08/06/19 0659 08/08/19 0548 08/10/19 0500 08/11/19 0654  NA 136  --   --   --   K 5.1 5.1 5.4* 4.8  CL 106  --   --   --   CO2 22  --   --   --   GLUCOSE 97  --   --   --   BUN 53*  --   --   --   CREATININE 1.23  --   --   --   CALCIUM 8.5*  --   --   --     ABG: No results for input(s): PHART, PCO2ART, PO2ART, HCO3, O2SAT in the last 168 hours.  Liver Function Tests: No results for input(s): AST, ALT, ALKPHOS, BILITOT, PROT, ALBUMIN in the last 168 hours. No results for input(s): LIPASE, AMYLASE in the last 168 hours. No results for input(s): AMMONIA in the last 168 hours.  CBC: Recent Labs  Lab 08/06/19 0659  WBC 12.4*  HGB 7.9*  HCT 26.3*  MCV 102.7*  PLT 232    Cardiac Enzymes: No results for input(s): CKTOTAL, CKMB,  CKMBINDEX, TROPONINI in the last 168 hours.  BNP (last 3 results) No results for input(s): BNP in the last 8760 hours.  ProBNP (last 3 results) No results for input(s): PROBNP in the last 8760 hours.  Radiological Exams: DG CHEST PORT 1 VIEW  Result Date: 08/09/2019 CLINICAL DATA:  74 year old male with a history of respiratory failure EXAM: PORTABLE CHEST 1 VIEW COMPARISON:  07/31/2019, 07/29/2019 FINDINGS: Cardiomediastinal silhouette unchanged in size and contour. Right heart border again obscured by overlying lung/pleural disease. The upper left heart border also obscured by lung/pleural disease, unchanged. Opacity at the right lung base obscuring the right hemidiaphragm, unchanged. Blunting of the left costophrenic angle. Tracheostomy tube is unchanged. Cardiac pacing device/AICD, unchanged. No pneumothorax. IMPRESSION: Unchanged appearance of the chest x-ray compared to 07/31/2019, with bilateral right greater than left mixed interstitial and airspace opacities, and bilateral basilar opacities, with the differential diagnosis including a combination of pleural fluid, edema, multifocal pneumonia, and/or chronic changes/atelectasis. Unchanged tracheostomy and left cardiac AICD. Electronically Signed   By: Gilmer Mor D.O.   On: 08/09/2019 14:22    Assessment/Plan Active Problems:   Acute on chronic respiratory failure with hypoxia (HCC)   Chronic atrial fibrillation (  HCC)   COVID-19 virus infection   Severe sepsis (HCC)   Multifocal pneumonia   Metabolic encephalopathy   1. Acute on chronic respiratory failure hypoxia plan is to continue with weaning on 16-hour goal pressure support 2. Chronic atrial fibrillation rate controlled 3. COVID-19 virus infection resolved 4. Severe sepsis hemodynamics are stable 5. Multifocal pneumonia treated clinically improved but still with significant damage 6. Pulmonary fibrotic changes likely going to be setting in 7. Metabolic encephalopathy no  change   I have personally seen and evaluated the patient, evaluated laboratory and imaging results, formulated the assessment and plan and placed orders. The Patient requires high complexity decision making with multiple systems involvement.  Rounds were done with the Respiratory Therapy Director and Staff therapists and discussed with nursing staff also.  Yevonne Pax, MD [facility] Pulmonary Critical Care Medicine Sleep Medicine

## 2019-08-12 ENCOUNTER — Other Ambulatory Visit (HOSPITAL_COMMUNITY): Payer: Medicare Other

## 2019-08-12 DIAGNOSIS — I482 Chronic atrial fibrillation, unspecified: Secondary | ICD-10-CM | POA: Diagnosis not present

## 2019-08-12 DIAGNOSIS — G9341 Metabolic encephalopathy: Secondary | ICD-10-CM | POA: Diagnosis not present

## 2019-08-12 DIAGNOSIS — U071 COVID-19: Secondary | ICD-10-CM | POA: Diagnosis not present

## 2019-08-12 DIAGNOSIS — J9621 Acute and chronic respiratory failure with hypoxia: Secondary | ICD-10-CM | POA: Diagnosis not present

## 2019-08-12 LAB — COMPREHENSIVE METABOLIC PANEL
ALT: 12 U/L (ref 0–44)
AST: 21 U/L (ref 15–41)
Albumin: 1.5 g/dL — ABNORMAL LOW (ref 3.5–5.0)
Alkaline Phosphatase: 146 U/L — ABNORMAL HIGH (ref 38–126)
Anion gap: 6 (ref 5–15)
BUN: 41 mg/dL — ABNORMAL HIGH (ref 8–23)
CO2: 32 mmol/L (ref 22–32)
Calcium: 8.1 mg/dL — ABNORMAL LOW (ref 8.9–10.3)
Chloride: 102 mmol/L (ref 98–111)
Creatinine, Ser: 1.18 mg/dL (ref 0.61–1.24)
GFR calc Af Amer: >60 mL/min (ref >60)
GFR calc non Af Amer: >60 mL/min (ref >60)
Glucose, Bld: 83 mg/dL (ref 70–99)
Potassium: 4.8 mmol/L (ref 3.5–5.1)
Sodium: 140 mmol/L (ref 135–145)
Total Bilirubin: 0.6 mg/dL (ref 0.3–1.2)
Total Protein: 6.7 g/dL (ref 6.5–8.1)

## 2019-08-12 LAB — CBC
HCT: 22.6 % — ABNORMAL LOW (ref 39.0–52.0)
Hemoglobin: 6.6 g/dL — CL (ref 13.0–17.0)
MCH: 30.7 pg (ref 26.0–34.0)
MCHC: 29.2 g/dL — ABNORMAL LOW (ref 30.0–36.0)
MCV: 105.1 fL — ABNORMAL HIGH (ref 80.0–100.0)
Platelets: 196 K/uL (ref 150–400)
RBC: 2.15 MIL/uL — ABNORMAL LOW (ref 4.22–5.81)
RDW: 19.9 % — ABNORMAL HIGH (ref 11.5–15.5)
WBC: 7.5 K/uL (ref 4.0–10.5)
nRBC: 0 % (ref 0.0–0.2)

## 2019-08-12 LAB — PREPARE RBC (CROSSMATCH)

## 2019-08-12 MED ORDER — IOHEXOL 300 MG/ML  SOLN
75.0000 mL | Freq: Once | INTRAMUSCULAR | Status: AC | PRN
  Administered 2019-08-12: 75 mL via INTRAVENOUS

## 2019-08-12 NOTE — Progress Notes (Signed)
Pulmonary Critical Care Medicine [facility] [place]   PULMONARY CRITICAL CARE SERVICE  PROGRESS NOTE  Date of Service: 08/12/2019  Henry Mcbride  YSA:[id number]  DOB: 03-28-1945   DOA: 06/23/2019  Referring Physician: Carron Curie, MD  HPI: Henry Mcbride is a 74 y.o. male seen for follow up of Acute on Chronic Respiratory Failure.  Patient was attempted on T collar today but did not tolerate so was placed back on pressure support.  Medications: Reviewed on Rounds  Physical Exam:  Vitals: Temperature is 96.0 pulse 70 respiratory rate 22 blood pressure 109/62 saturations 100%  Ventilator Settings on pressure support FiO2 30% pressure 12 PEEP 5  . General: Comfortable at this time . Eyes: Grossly normal lids, irises & conjunctiva . ENT: grossly tongue is normal . Neck: no obvious mass . Cardiovascular: S1 S2 normal no gallop . Respiratory: No rhonchi coarse breath sounds . Abdomen: soft . Skin: no rash seen on limited exam . Musculoskeletal: not rigid . Psychiatric:unable to assess . Neurologic: no seizure no involuntary movements         Lab Data:   Basic Metabolic Panel: Recent Labs  Lab 08/06/19 0659 08/08/19 0548 08/10/19 0500 08/11/19 0654 08/12/19 0507  NA 136  --   --   --  140  K 5.1 5.1 5.4* 4.8 4.8  CL 106  --   --   --  102  CO2 22  --   --   --  32  GLUCOSE 97  --   --   --  83  BUN 53*  --   --   --  41*  CREATININE 1.23  --   --   --  1.18  CALCIUM 8.5*  --   --   --  8.1*    ABG: Recent Labs  Lab 08/11/19 1405  HCO3 33.0*  O2SAT 98.3    Liver Function Tests: Recent Labs  Lab 08/12/19 0507  AST 21  ALT 12  ALKPHOS 146*  BILITOT 0.6  PROT 6.7  ALBUMIN 1.5*   No results for input(s): LIPASE, AMYLASE in the last 168 hours. Recent Labs  Lab 08/11/19 1405  AMMONIA 34    CBC: Recent Labs  Lab 08/06/19 0659 08/11/19 1405 08/12/19 0507  WBC 12.4* 10.8* 7.5  NEUTROABS  --  8.6*  --   HGB 7.9* 7.4* 6.6*  HCT 26.3*  25.0* 22.6*  MCV 102.7* 105.5* 105.1*  PLT 232 210 196    Cardiac Enzymes: No results for input(s): CKTOTAL, CKMB, CKMBINDEX, TROPONINI in the last 168 hours.  BNP (last 3 results) No results for input(s): BNP in the last 8760 hours.  ProBNP (last 3 results) No results for input(s): PROBNP in the last 8760 hours.  Radiological Exams: No results found.  Assessment/Plan Active Problems:   Acute on chronic respiratory failure with hypoxia (HCC)   Chronic atrial fibrillation (HCC)   COVID-19 virus infection   Severe sepsis (HCC)   Multifocal pneumonia   Metabolic encephalopathy   1. Acute on chronic respiratory failure hypoxia plan is going to be to continue with pressure support titrate oxygen continue pulmonary toilet. 2. Chronic atrial fibrillation rate is controlled we will continue to follow along 3. COVID-19 virus infection at baseline we will continue with supportive care patient in resolution phase 4. Severe sepsis resolved hemodynamics are stable 5. Multifocal pneumonia some residual changes still noted based on the x-rays 6. Metabolic encephalopathy no change we will continue to follow along  I have personally seen and evaluated the patient, evaluated laboratory and imaging results, formulated the assessment and plan and placed orders. The Patient requires high complexity decision making with multiple systems involvement.  Rounds were done with the Respiratory Therapy Director and Staff therapists and discussed with nursing staff also.  Allyne Gee, MD [facility] Pulmonary Critical Care Medicine Sleep Medicine

## 2019-08-13 ENCOUNTER — Other Ambulatory Visit (HOSPITAL_COMMUNITY): Payer: Medicare Other

## 2019-08-13 DIAGNOSIS — I482 Chronic atrial fibrillation, unspecified: Secondary | ICD-10-CM | POA: Diagnosis not present

## 2019-08-13 DIAGNOSIS — U071 COVID-19: Secondary | ICD-10-CM | POA: Diagnosis not present

## 2019-08-13 DIAGNOSIS — J9621 Acute and chronic respiratory failure with hypoxia: Secondary | ICD-10-CM | POA: Diagnosis not present

## 2019-08-13 DIAGNOSIS — G9341 Metabolic encephalopathy: Secondary | ICD-10-CM | POA: Diagnosis not present

## 2019-08-13 HISTORY — PX: IR THORACENTESIS ASP PLEURAL SPACE W/IMG GUIDE: IMG5380

## 2019-08-13 LAB — TYPE AND SCREEN
ABO/RH(D): A POS
Antibody Screen: NEGATIVE
Unit division: 0

## 2019-08-13 LAB — BLOOD GAS, ARTERIAL
Acid-Base Excess: 7.4 mmol/L — ABNORMAL HIGH (ref 0.0–2.0)
Bicarbonate: 32 mmol/L — ABNORMAL HIGH (ref 20.0–28.0)
FIO2: 30
O2 Saturation: 98.7 %
Patient temperature: 37
pCO2 arterial: 50.5 mmHg — ABNORMAL HIGH (ref 32.0–48.0)
pH, Arterial: 7.418 (ref 7.350–7.450)
pO2, Arterial: 105 mmHg (ref 83.0–108.0)

## 2019-08-13 LAB — CBC
HCT: 30.9 % — ABNORMAL LOW (ref 39.0–52.0)
Hemoglobin: 9.2 g/dL — ABNORMAL LOW (ref 13.0–17.0)
MCH: 30.6 pg (ref 26.0–34.0)
MCHC: 29.8 g/dL — ABNORMAL LOW (ref 30.0–36.0)
MCV: 102.7 fL — ABNORMAL HIGH (ref 80.0–100.0)
Platelets: 194 10*3/uL (ref 150–400)
RBC: 3.01 MIL/uL — ABNORMAL LOW (ref 4.22–5.81)
RDW: 18.9 % — ABNORMAL HIGH (ref 11.5–15.5)
WBC: 9.8 10*3/uL (ref 4.0–10.5)
nRBC: 0 % (ref 0.0–0.2)

## 2019-08-13 LAB — BPAM RBC
Blood Product Expiration Date: 202108032359
ISSUE DATE / TIME: 202107061357
Unit Type and Rh: 6200

## 2019-08-13 MED ORDER — LIDOCAINE HCL (PF) 1 % IJ SOLN
INTRAMUSCULAR | Status: DC | PRN
  Administered 2019-08-13: 10 mL

## 2019-08-13 MED ORDER — LIDOCAINE HCL 1 % IJ SOLN
INTRAMUSCULAR | Status: AC
  Filled 2019-08-13: qty 20

## 2019-08-13 NOTE — Progress Notes (Signed)
Pulmonary Critical Care Medicine [facility] [place]   PULMONARY CRITICAL CARE SERVICE  PROGRESS NOTE  Date of Service: 08/13/2019  Tonny Isensee  TKZ:[id number]  DOB: 04/16/1945   DOA: 06/23/2019  Referring Physician: Carron Curie, MD  HPI: Dontavius Keim is a 74 y.o. male seen for follow up of Acute on Chronic Respiratory Failure.  Patient currently is on the ventilator and full support has been on assist control mode on 30% FiO2  Medications: Reviewed on Rounds  Physical Exam:  Vitals: Temperature is 96.6 pulse 70 respiratory 24 blood pressure is 119/70 saturations 100%  Ventilator Settings on assist control FiO2 is 30% tidal volume 450 PEEP 5  . General: Comfortable at this time . Eyes: Grossly normal lids, irises & conjunctiva . ENT: grossly tongue is normal . Neck: no obvious mass . Cardiovascular: S1 S2 normal no gallop . Respiratory: No rhonchi coarse breath sounds are noted at this time . Abdomen: soft . Skin: no rash seen on limited exam . Musculoskeletal: not rigid . Psychiatric:unable to assess . Neurologic: no seizure no involuntary movements         Lab Data:   Basic Metabolic Panel: Recent Labs  Lab 08/08/19 0548 08/10/19 0500 08/11/19 0654 08/12/19 0507  NA  --   --   --  140  K 5.1 5.4* 4.8 4.8  CL  --   --   --  102  CO2  --   --   --  32  GLUCOSE  --   --   --  83  BUN  --   --   --  41*  CREATININE  --   --   --  1.18  CALCIUM  --   --   --  8.1*    ABG: Recent Labs  Lab 08/11/19 1405 08/13/19 0630  PHART  --  7.418  PCO2ART  --  50.5*  PO2ART  --  105  HCO3 33.0* 32.0*  O2SAT 98.3 98.7    Liver Function Tests: Recent Labs  Lab 08/12/19 0507  AST 21  ALT 12  ALKPHOS 146*  BILITOT 0.6  PROT 6.7  ALBUMIN 1.5*   No results for input(s): LIPASE, AMYLASE in the last 168 hours. Recent Labs  Lab 08/11/19 1405  AMMONIA 34    CBC: Recent Labs  Lab 08/11/19 1405 08/12/19 0507  WBC 10.8* 7.5  NEUTROABS 8.6*   --   HGB 7.4* 6.6*  HCT 25.0* 22.6*  MCV 105.5* 105.1*  PLT 210 196    Cardiac Enzymes: No results for input(s): CKTOTAL, CKMB, CKMBINDEX, TROPONINI in the last 168 hours.  BNP (last 3 results) No results for input(s): BNP in the last 8760 hours.  ProBNP (last 3 results) No results for input(s): PROBNP in the last 8760 hours.  Radiological Exams: CT CHEST W CONTRAST  Result Date: 08/12/2019 CLINICAL DATA:  Respiratory failure. EXAM: CT CHEST WITH CONTRAST TECHNIQUE: Multidetector CT imaging of the chest was performed during intravenous contrast administration. CONTRAST:  42mL OMNIPAQUE IOHEXOL 300 MG/ML  SOLN COMPARISON:  Jul 07, 2019. FINDINGS: Cardiovascular: No evidence of thoracic aortic dissection or aneurysm. Mild cardiomegaly is noted. No pericardial effusion is noted. Mediastinum/Nodes: Tracheostomy tube is in good position esophagus is unremarkable. No adenopathy is noted. Thyroid gland is unremarkable. Lungs/Pleura: No pneumothorax is noted. Large bilateral pleural effusions are noted. Minimal subsegmental atelectasis of left lower lobe is noted. Mild to moderate atelectasis of right lower lobe is noted. Upper Abdomen: Gastrostomy tube is  in good position. No acute abnormality is noted. Musculoskeletal: Minimally displaced fracture is seen involving the anterior portion of a left lower rib. IMPRESSION: 1. Large bilateral pleural effusions are noted with minimal subsegmental atelectasis of left lower lobe. 2. Mild to moderate atelectasis of right lower lobe is noted. 3. Minimally displaced fracture is seen involving the anterior portion of a left lower rib. Electronically Signed   By: Lupita Raider M.D.   On: 08/12/2019 13:54    Assessment/Plan Active Problems:   Acute on chronic respiratory failure with hypoxia (HCC)   Chronic atrial fibrillation (HCC)   COVID-19 virus infection   Severe sepsis (HCC)   Multifocal pneumonia   Metabolic encephalopathy   1. Acute on chronic  respiratory failure hypoxia plan is to continue with trying on pressure support weaning again patient has not yet completed 16 hours we will try once again.  Chest x-ray was showing large bilateral effusions may need some drainage. 2. Chronic atrial fibrillation rate controlled 3. COVID-19 virus infection in resolution phase we will continue to follow 4. Severe sepsis resolved hemodynamics are stable 5. Multifocal pneumonia treated we will continue to follow along 6. Metabolic encephalopathy no change we will continue with supportive care   I have personally seen and evaluated the patient, evaluated laboratory and imaging results, formulated the assessment and plan and placed orders. The Patient requires high complexity decision making with multiple systems involvement.  Rounds were done with the Respiratory Therapy Director and Staff therapists and discussed with nursing staff also.  Yevonne Pax, MD [facility] Pulmonary Critical Care Medicine Sleep Medicine

## 2019-08-14 DIAGNOSIS — G9341 Metabolic encephalopathy: Secondary | ICD-10-CM | POA: Diagnosis not present

## 2019-08-14 DIAGNOSIS — I482 Chronic atrial fibrillation, unspecified: Secondary | ICD-10-CM | POA: Diagnosis not present

## 2019-08-14 DIAGNOSIS — U071 COVID-19: Secondary | ICD-10-CM | POA: Diagnosis not present

## 2019-08-14 DIAGNOSIS — J9621 Acute and chronic respiratory failure with hypoxia: Secondary | ICD-10-CM | POA: Diagnosis not present

## 2019-08-14 LAB — CBC
HCT: 29.1 % — ABNORMAL LOW (ref 39.0–52.0)
Hemoglobin: 8.5 g/dL — ABNORMAL LOW (ref 13.0–17.0)
MCH: 30.1 pg (ref 26.0–34.0)
MCHC: 29.2 g/dL — ABNORMAL LOW (ref 30.0–36.0)
MCV: 103.2 fL — ABNORMAL HIGH (ref 80.0–100.0)
Platelets: 187 10*3/uL (ref 150–400)
RBC: 2.82 MIL/uL — ABNORMAL LOW (ref 4.22–5.81)
RDW: 18.5 % — ABNORMAL HIGH (ref 11.5–15.5)
WBC: 12.7 10*3/uL — ABNORMAL HIGH (ref 4.0–10.5)
nRBC: 0 % (ref 0.0–0.2)

## 2019-08-14 NOTE — Progress Notes (Signed)
Pulmonary Critical Care Medicine [facility] [place]   PULMONARY CRITICAL CARE SERVICE  PROGRESS NOTE  Date of Service: 08/14/2019  Henry Mcbride  [id number]:[id number]  DOB: 1945-05-30   DOA: 06/23/2019  Referring Physician: Carron Curie, MD  HPI: Henry Mcbride is a 74 y.o. male seen for follow up of Acute on Chronic Respiratory Failure.  Patient currently is on pressure support has been on 30% FiO2 with a pressure support of 12/5 patient did undergo thoracentesis with removal of fluid approximately 1.2 L based on the leads from interventional radiology  Medications: Reviewed on Rounds  Physical Exam:  Vitals: Temperature is 97.0 pulse 70 respiratory 20 blood pressure is 145/70 saturations 100%  Ventilator Settings on pressure support FiO2 is 30% pressure 12/5  . General: Comfortable at this time . Eyes: Grossly normal lids, irises & conjunctiva . ENT: grossly tongue is normal . Neck: no obvious mass . Cardiovascular: S1 S2 normal no gallop . Respiratory: No rhonchi coarse breath sounds . Abdomen: soft . Skin: no rash seen on limited exam . Musculoskeletal: not rigid . Psychiatric:unable to assess . Neurologic: no seizure no involuntary movements         Lab Data:   Basic Metabolic Panel: Recent Labs  Lab 08/08/19 0548 08/10/19 0500 08/11/19 0654 08/12/19 0507  NA  --   --   --  140  K 5.1 5.4* 4.8 4.8  CL  --   --   --  102  CO2  --   --   --  32  GLUCOSE  --   --   --  83  BUN  --   --   --  41*  CREATININE  --   --   --  1.18  CALCIUM  --   --   --  8.1*    ABG: Recent Labs  Lab 08/11/19 1405 08/13/19 0630  PHART  --  7.418  PCO2ART  --  50.5*  PO2ART  --  105  HCO3 33.0* 32.0*  O2SAT 98.3 98.7    Liver Function Tests: Recent Labs  Lab 08/12/19 0507  AST 21  ALT 12  ALKPHOS 146*  BILITOT 0.6  PROT 6.7  ALBUMIN 1.5*   No results for input(s): LIPASE, AMYLASE in the last 168 hours. Recent Labs  Lab 08/11/19 1405  AMMONIA 34     CBC: Recent Labs  Lab 08/11/19 1405 08/12/19 0507 08/13/19 0956 08/14/19 0801  WBC 10.8* 7.5 9.8 12.7*  NEUTROABS 8.6*  --   --   --   HGB 7.4* 6.6* 9.2* 8.5*  HCT 25.0* 22.6* 30.9* 29.1*  MCV 105.5* 105.1* 102.7* 103.2*  PLT 210 196 194 187    Cardiac Enzymes: No results for input(s): CKTOTAL, CKMB, CKMBINDEX, TROPONINI in the last 168 hours.  BNP (last 3 results) No results for input(s): BNP in the last 8760 hours.  ProBNP (last 3 results) No results for input(s): PROBNP in the last 8760 hours.  Radiological Exams: CT CHEST W CONTRAST  Result Date: 08/12/2019 CLINICAL DATA:  Respiratory failure. EXAM: CT CHEST WITH CONTRAST TECHNIQUE: Multidetector CT imaging of the chest was performed during intravenous contrast administration. CONTRAST:  46mL OMNIPAQUE IOHEXOL 300 MG/ML  SOLN COMPARISON:  Jul 07, 2019. FINDINGS: Cardiovascular: No evidence of thoracic aortic dissection or aneurysm. Mild cardiomegaly is noted. No pericardial effusion is noted. Mediastinum/Nodes: Tracheostomy tube is in good position esophagus is unremarkable. No adenopathy is noted. Thyroid gland is unremarkable. Lungs/Pleura: No pneumothorax is noted. Large  bilateral pleural effusions are noted. Minimal subsegmental atelectasis of left lower lobe is noted. Mild to moderate atelectasis of right lower lobe is noted. Upper Abdomen: Gastrostomy tube is in good position. No acute abnormality is noted. Musculoskeletal: Minimally displaced fracture is seen involving the anterior portion of a left lower rib. IMPRESSION: 1. Large bilateral pleural effusions are noted with minimal subsegmental atelectasis of left lower lobe. 2. Mild to moderate atelectasis of right lower lobe is noted. 3. Minimally displaced fracture is seen involving the anterior portion of a left lower rib. Electronically Signed   By: Lupita Raider M.D.   On: 08/12/2019 13:54   DG Chest Port 1 View  Result Date: 08/13/2019 CLINICAL DATA:  74 year old  male status post ultrasound-guided right side thoracentesis this afternoon. EXAM: PORTABLE CHEST 1 VIEW COMPARISON:  Chest CT yesterday. Portable chest 08/09/2019 and earlier. FINDINGS: Portable AP semi upright view at 1305 hours. Decreased veiling opacity in the right lung with improved ventilation. No pneumothorax identified. Stable cardiac size and mediastinal contours. Stable tracheostomy and left chest AICD. Stable dense retrocardiac opacity which seems to mostly correspond to layering left pleural effusion as seen by CT yesterday. Superimposed upper lobe atelectasis suspected. No areas of worsening ventilation compared to 08/09/2019. IMPRESSION: 1. Improved right lung ventilation and no pneumothorax following thoracentesis. 2. Stable left pleural effusion with atelectasis. Electronically Signed   By: Odessa Fleming M.D.   On: 08/13/2019 13:35   IR THORACENTESIS ASP PLEURAL SPACE W/IMG GUIDE  Result Date: 08/13/2019 INDICATION: Respiratory failure with tracheostomy and ventilator dependence. Right pleural effusion. Request for therapeutic thoracentesis. EXAM: ULTRASOUND GUIDED RIGHT THORACENTESIS MEDICATIONS: 1% lidocaine 15 mL COMPLICATIONS: None immediate. PROCEDURE: An ultrasound guided thoracentesis was thoroughly discussed with the patient and questions answered. The benefits, risks, alternatives and complications were also discussed. The patient understands and wishes to proceed with the procedure. Written consent was obtained. Ultrasound was performed to localize and mark an adequate pocket of fluid in the right chest. The area was then prepped and draped in the normal sterile fashion. 1% Lidocaine was used for local anesthesia. Under ultrasound guidance a 6 Fr Safe-T-Centesis catheter was introduced. Thoracentesis was performed. The catheter was removed and a dressing applied. FINDINGS: A total of approximately 1.2 L of clear amber fluid was removed. IMPRESSION: Successful ultrasound guided right  thoracentesis yielding 1.2 L of pleural fluid. Read by: Corrin Parker, PA-C Electronically Signed   By: Irish Lack M.D.   On: 08/13/2019 12:53    Assessment/Plan Active Problems:   Acute on chronic respiratory failure with hypoxia (HCC)   Chronic atrial fibrillation (HCC)   COVID-19 virus infection   Severe sepsis (HCC)   Multifocal pneumonia   Metabolic encephalopathy   1. Acute on chronic respiratory failure with hypoxia continue with pressure support 12/5 patient currently is on 30% FiO2.  Good saturations are noted 2. Chronic atrial fibrillation rate is controlled we will continue to follow along 3. COVID-19 virus infection in resolution phase we will continue to follow 4. Severe sepsis resolved 5. Multifocal pneumonia treated improved however still has residual effusions requiring drainage 6. Metabolic encephalopathy no change   I have personally seen and evaluated the patient, evaluated laboratory and imaging results, formulated the assessment and plan and placed orders. The Patient requires high complexity decision making with multiple systems involvement.  Rounds were done with the Respiratory Therapy Director and Staff therapists and discussed with nursing staff also.  Yevonne Pax, MD [facility] Pulmonary Critical Care Medicine  Sleep Medicine

## 2019-08-15 DIAGNOSIS — J9621 Acute and chronic respiratory failure with hypoxia: Secondary | ICD-10-CM | POA: Diagnosis not present

## 2019-08-15 DIAGNOSIS — I482 Chronic atrial fibrillation, unspecified: Secondary | ICD-10-CM | POA: Diagnosis not present

## 2019-08-15 DIAGNOSIS — U071 COVID-19: Secondary | ICD-10-CM | POA: Diagnosis not present

## 2019-08-15 DIAGNOSIS — G9341 Metabolic encephalopathy: Secondary | ICD-10-CM | POA: Diagnosis not present

## 2019-08-15 LAB — CBC
HCT: 29.1 % — ABNORMAL LOW (ref 39.0–52.0)
Hemoglobin: 8.8 g/dL — ABNORMAL LOW (ref 13.0–17.0)
MCH: 31.4 pg (ref 26.0–34.0)
MCHC: 30.2 g/dL (ref 30.0–36.0)
MCV: 103.9 fL — ABNORMAL HIGH (ref 80.0–100.0)
Platelets: 188 10*3/uL (ref 150–400)
RBC: 2.8 MIL/uL — ABNORMAL LOW (ref 4.22–5.81)
RDW: 17.7 % — ABNORMAL HIGH (ref 11.5–15.5)
WBC: 10 10*3/uL (ref 4.0–10.5)
nRBC: 0 % (ref 0.0–0.2)

## 2019-08-15 LAB — NOVEL CORONAVIRUS, NAA (HOSP ORDER, SEND-OUT TO REF LAB; TAT 18-24 HRS): SARS-CoV-2, NAA: NOT DETECTED

## 2019-08-15 LAB — BASIC METABOLIC PANEL
Anion gap: 6 (ref 5–15)
BUN: 57 mg/dL — ABNORMAL HIGH (ref 8–23)
CO2: 29 mmol/L (ref 22–32)
Calcium: 8.1 mg/dL — ABNORMAL LOW (ref 8.9–10.3)
Chloride: 104 mmol/L (ref 98–111)
Creatinine, Ser: 1.23 mg/dL (ref 0.61–1.24)
GFR calc Af Amer: >60 mL/min (ref >60)
GFR calc non Af Amer: 57 mL/min — ABNORMAL LOW (ref >60)
Glucose, Bld: 74 mg/dL (ref 70–99)
Potassium: 6.4 mmol/L (ref 3.5–5.1)
Sodium: 139 mmol/L (ref 135–145)

## 2019-08-15 LAB — MAGNESIUM: Magnesium: 2.1 mg/dL (ref 1.7–2.4)

## 2019-08-15 LAB — POTASSIUM
Potassium: 5.7 mmol/L — ABNORMAL HIGH (ref 3.5–5.1)
Potassium: 5.2 mmol/L — ABNORMAL HIGH (ref 3.5–5.1)

## 2019-08-15 NOTE — Progress Notes (Signed)
Pulmonary Critical Care Medicine [facility] [place]   PULMONARY CRITICAL CARE SERVICE  PROGRESS NOTE  Date of Service: 08/15/2019  Henry Mcbride  PYK:[id number]  DOB: Feb 11, 1945   DOA: 06/23/2019  Referring Physician: Carron Curie, MD  HPI: Henry Mcbride is a 74 y.o. male seen for follow up of Acute on Chronic Respiratory Failure. Patient yesterday did about 3.5 hours of pressure support now is back on full support on assist control mode  Medications: Reviewed on Rounds  Physical Exam:  Vitals: Temperature is 97.6 pulse 72 respiratory rate 30 blood pressure is 168/77 saturations 100%  Ventilator Settings on assist control mode  . General: Comfortable at this time . Eyes: Grossly normal lids, irises & conjunctiva . ENT: grossly tongue is normal . Neck: no obvious mass . Cardiovascular: S1 S2 normal no gallop . Respiratory: No rhonchi coarse breath sounds . Abdomen: soft . Skin: no rash seen on limited exam . Musculoskeletal: not rigid . Psychiatric:unable to assess . Neurologic: no seizure no involuntary movements         Lab Data:   Basic Metabolic Panel: Recent Labs  Lab 08/11/19 0654 08/12/19 0507 08/15/19 0754 08/15/19 1318 08/15/19 2023  NA  --  140 139  --   --   K 4.8 4.8 6.4* 5.7* 5.2*  CL  --  102 104  --   --   CO2  --  32 29  --   --   GLUCOSE  --  83 74  --   --   BUN  --  41* 57*  --   --   CREATININE  --  1.18 1.23  --   --   CALCIUM  --  8.1* 8.1*  --   --   MG  --   --  2.1  --   --     ABG: Recent Labs  Lab 08/11/19 1405 08/13/19 0630  PHART  --  7.418  PCO2ART  --  50.5*  PO2ART  --  105  HCO3 33.0* 32.0*  O2SAT 98.3 98.7    Liver Function Tests: Recent Labs  Lab 08/12/19 0507  AST 21  ALT 12  ALKPHOS 146*  BILITOT 0.6  PROT 6.7  ALBUMIN 1.5*   No results for input(s): LIPASE, AMYLASE in the last 168 hours. Recent Labs  Lab 08/11/19 1405  AMMONIA 34    CBC: Recent Labs  Lab 08/11/19 1405  08/12/19 0507 08/13/19 0956 08/14/19 0801 08/15/19 1318  WBC 10.8* 7.5 9.8 12.7* 10.0  NEUTROABS 8.6*  --   --   --   --   HGB 7.4* 6.6* 9.2* 8.5* 8.8*  HCT 25.0* 22.6* 30.9* 29.1* 29.1*  MCV 105.5* 105.1* 102.7* 103.2* 103.9*  PLT 210 196 194 187 188    Cardiac Enzymes: No results for input(s): CKTOTAL, CKMB, CKMBINDEX, TROPONINI in the last 168 hours.  BNP (last 3 results) No results for input(s): BNP in the last 8760 hours.  ProBNP (last 3 results) No results for input(s): PROBNP in the last 8760 hours.  Radiological Exams: No results found.  Assessment/Plan Active Problems:   Acute on chronic respiratory failure with hypoxia (HCC)   Chronic atrial fibrillation (HCC)   COVID-19 virus infection   Severe sepsis (HCC)   Multifocal pneumonia   Metabolic encephalopathy   1. Acute on chronic respiratory failure hypoxia patient was placed back on the ventilator and full support because of increased work of breathing. We will continue with full supportive care  for now reassess in the morning 2. Chronic atrial fibrillation rate is controlled 3. COVID-19 virus infection treated in resolution phase 4. Severe sepsis resolved 5. Multifocal pneumonia treated with residual pulmonary damage 6. Metabolic encephalopathy no change   I have personally seen and evaluated the patient, evaluated laboratory and imaging results, formulated the assessment and plan and placed orders. The Patient requires high complexity decision making with multiple systems involvement.  Rounds were done with the Respiratory Therapy Director and Staff therapists and discussed with nursing staff also.  Yevonne Pax, MD [facility] Pulmonary Critical Care Medicine Sleep Medicine

## 2019-08-16 DIAGNOSIS — I482 Chronic atrial fibrillation, unspecified: Secondary | ICD-10-CM | POA: Diagnosis not present

## 2019-08-16 DIAGNOSIS — U071 COVID-19: Secondary | ICD-10-CM | POA: Diagnosis not present

## 2019-08-16 DIAGNOSIS — G9341 Metabolic encephalopathy: Secondary | ICD-10-CM | POA: Diagnosis not present

## 2019-08-16 DIAGNOSIS — J9621 Acute and chronic respiratory failure with hypoxia: Secondary | ICD-10-CM | POA: Diagnosis not present

## 2019-08-16 NOTE — Progress Notes (Addendum)
Pulmonary Critical Care Medicine [facility] [place]   PULMONARY CRITICAL CARE SERVICE  PROGRESS NOTE  Date of Service: 08/16/2019  Henry Mcbride  [id number]  DOB: [birth year]   DOA: 06/23/2019  Referring Physician: Carron Curie, MD  HPI: Henry Mcbride is a 74 y.o. male seen for follow up of Acute on Chronic Respiratory Failure.  Patient remains on pressure support for 16-hour goal today currently on 30% FiO2 satting well with no distress.  Medications: Reviewed on Rounds  Physical Exam:  Vitals: Pulse 71 respirations 23 BP 125/72 O2 sat 100% temp 97.7  Ventilator Settings pressure support 12/5 FiO2 30%  . General: Comfortable at this time . Eyes: Grossly normal lids, irises & conjunctiva . ENT: grossly tongue is normal . Neck: no obvious mass . Cardiovascular: S1 S2 normal no gallop . Respiratory: No rales or rhonchi noted . Abdomen: soft . Skin: no rash seen on limited exam . Musculoskeletal: not rigid . Psychiatric:unable to assess . Neurologic: no seizure no involuntary movements         Lab Data:   Basic Metabolic Panel: Recent Labs  Lab 08/11/19 0654 08/12/19 0507 08/15/19 0754 08/15/19 1318 08/15/19 2023  NA  --  140 139  --   --   K 4.8 4.8 6.4* 5.7* 5.2*  CL  --  102 104  --   --   CO2  --  32 29  --   --   GLUCOSE  --  83 74  --   --   BUN  --  41* 57*  --   --   CREATININE  --  1.18 1.23  --   --   CALCIUM  --  8.1* 8.1*  --   --   MG  --   --  2.1  --   --     ABG: Recent Labs  Lab 08/11/19 1405 08/13/19 0630  PHART  --  7.418  PCO2ART  --  50.5*  PO2ART  --  105  HCO3 33.0* 32.0*  O2SAT 98.3 98.7    Liver Function Tests: Recent Labs  Lab 08/12/19 0507  AST 21  ALT 12  ALKPHOS 146*  BILITOT 0.6  PROT 6.7  ALBUMIN 1.5*   No results for input(s): LIPASE, AMYLASE in the last 168 hours. Recent Labs  Lab 08/11/19 1405  AMMONIA 34    CBC: Recent Labs  Lab 08/11/19 1405 08/12/19 0507 08/13/19 0956  08/14/19 0801 08/15/19 1318  WBC 10.8* 7.5 9.8 12.7* 10.0  NEUTROABS 8.6*  --   --   --   --   HGB 7.4* 6.6* 9.2* 8.5* 8.8*  HCT 25.0* 22.6* 30.9* 29.1* 29.1*  MCV 105.5* 105.1* 102.7* 103.2* 103.9*  PLT 210 196 194 187 188    Cardiac Enzymes: No results for input(s): CKTOTAL, CKMB, CKMBINDEX, TROPONINI in the last 168 hours.  BNP (last 3 results) No results for input(s): BNP in the last 8760 hours.  ProBNP (last 3 results) No results for input(s): PROBNP in the last 8760 hours.  Radiological Exams: No results found.  Assessment/Plan Active Problems:   Acute on chronic respiratory failure with hypoxia (HCC)   Chronic atrial fibrillation (HCC)   COVID-19 virus infection   Severe sepsis (HCC)   Multifocal pneumonia   Metabolic encephalopathy   1. Acute on chronic respiratory failure hypoxia patient continues for 16-hour goal today on pressure support 12/5 FiO2 30% continue supportive measures and pulmonary toilet. 2. Chronic atrial fibrillation rate is controlled  3. COVID-19 virus infection treated in resolution phase 4. Severe sepsis resolved 5. Multifocal pneumonia treated with residual pulmonary damage 6. Metabolic encephalopathy no change   I have personally seen and evaluated the patient, evaluated laboratory and imaging results, formulated the assessment and plan and placed orders. The Patient requires high complexity decision making with multiple systems involvement.  Rounds were done with the Respiratory Therapy Director and Staff therapists and discussed with nursing staff also.  Yevonne Pax, MD [facility] Pulmonary Critical Care Medicine Sleep Medicine

## 2019-08-17 DIAGNOSIS — I482 Chronic atrial fibrillation, unspecified: Secondary | ICD-10-CM | POA: Diagnosis not present

## 2019-08-17 DIAGNOSIS — U071 COVID-19: Secondary | ICD-10-CM | POA: Diagnosis not present

## 2019-08-17 DIAGNOSIS — G9341 Metabolic encephalopathy: Secondary | ICD-10-CM | POA: Diagnosis not present

## 2019-08-17 DIAGNOSIS — J9621 Acute and chronic respiratory failure with hypoxia: Secondary | ICD-10-CM | POA: Diagnosis not present

## 2019-08-17 LAB — BASIC METABOLIC PANEL
Anion gap: 5 (ref 5–15)
BUN: 58 mg/dL — ABNORMAL HIGH (ref 8–23)
CO2: 32 mmol/L (ref 22–32)
Calcium: 8.3 mg/dL — ABNORMAL LOW (ref 8.9–10.3)
Chloride: 109 mmol/L (ref 98–111)
Creatinine, Ser: 1.06 mg/dL (ref 0.61–1.24)
GFR calc Af Amer: >60 mL/min (ref >60)
GFR calc non Af Amer: >60 mL/min (ref >60)
Glucose, Bld: 79 mg/dL (ref 70–99)
Potassium: 5.4 mmol/L — ABNORMAL HIGH (ref 3.5–5.1)
Sodium: 146 mmol/L — ABNORMAL HIGH (ref 135–145)

## 2019-08-17 LAB — CBC
HCT: 28.5 % — ABNORMAL LOW (ref 39.0–52.0)
Hemoglobin: 8.3 g/dL — ABNORMAL LOW (ref 13.0–17.0)
MCH: 31.1 pg (ref 26.0–34.0)
MCHC: 29.1 g/dL — ABNORMAL LOW (ref 30.0–36.0)
MCV: 106.7 fL — ABNORMAL HIGH (ref 80.0–100.0)
Platelets: 182 10*3/uL (ref 150–400)
RBC: 2.67 MIL/uL — ABNORMAL LOW (ref 4.22–5.81)
RDW: 17.5 % — ABNORMAL HIGH (ref 11.5–15.5)
WBC: 7.1 10*3/uL (ref 4.0–10.5)
nRBC: 0 % (ref 0.0–0.2)

## 2019-08-17 LAB — POTASSIUM: Potassium: 5.6 mmol/L — ABNORMAL HIGH (ref 3.5–5.1)

## 2019-08-17 NOTE — Progress Notes (Signed)
Pulmonary Critical Care Medicine [facility] [place]   PULMONARY CRITICAL CARE SERVICE  PROGRESS NOTE  Date of Service: 08/17/2019  Henry Mcbride  [id number]:[id number]  DOB: [birth year]   DOA: 06/23/2019  Referring Physician: Carron Curie, MD  HPI: Henry Mcbride is a 74 y.o. male seen for follow up of Acute on Chronic Respiratory Failure.  Patient was able to do a short while on T collar now is back on the ventilator and pressure support mode  Medications: Reviewed on Rounds  Physical Exam:  Vitals: Temperature 96.7 pulse 70 respiratory 26 blood pressure is 106/88 saturations 100%  Ventilator Settings on pressure support FiO2 35% pressure support 12/5  . General: Comfortable at this time . Eyes: Grossly normal lids, irises & conjunctiva . ENT: grossly tongue is normal . Neck: no obvious mass . Cardiovascular: S1 S2 normal no gallop . Respiratory: No rhonchi coarse breath sounds . Abdomen: soft . Skin: no rash seen on limited exam . Musculoskeletal: not rigid . Psychiatric:unable to assess . Neurologic: no seizure no involuntary movements         Lab Data:   Basic Metabolic Panel: Recent Labs  Lab 08/12/19 0507 08/15/19 0754 08/15/19 1318 08/15/19 2023 08/17/19 0505  NA 140 139  --   --  146*  K 4.8 6.4* 5.7* 5.2* 5.4*  CL 102 104  --   --  109  CO2 32 29  --   --  32  GLUCOSE 83 74  --   --  79  BUN 41* 57*  --   --  58*  CREATININE 1.18 1.23  --   --  1.06  CALCIUM 8.1* 8.1*  --   --  8.3*  MG  --  2.1  --   --   --     ABG: Recent Labs  Lab 08/11/19 1405 08/13/19 0630  PHART  --  7.418  PCO2ART  --  50.5*  PO2ART  --  105  HCO3 33.0* 32.0*  O2SAT 98.3 98.7    Liver Function Tests: Recent Labs  Lab 08/12/19 0507  AST 21  ALT 12  ALKPHOS 146*  BILITOT 0.6  PROT 6.7  ALBUMIN 1.5*   No results for input(s): LIPASE, AMYLASE in the last 168 hours. Recent Labs  Lab 08/11/19 1405  AMMONIA 34    CBC: Recent Labs  Lab 08/11/19 1405  08/11/19 1405 08/12/19 0507 08/13/19 0956 08/14/19 0801 08/15/19 1318 08/17/19 0505  WBC 10.8*   < > 7.5 9.8 12.7* 10.0 7.1  NEUTROABS 8.6*  --   --   --   --   --   --   HGB 7.4*   < > 6.6* 9.2* 8.5* 8.8* 8.3*  HCT 25.0*   < > 22.6* 30.9* 29.1* 29.1* 28.5*  MCV 105.5*   < > 105.1* 102.7* 103.2* 103.9* 106.7*  PLT 210   < > 196 194 187 188 182   < > = values in this interval not displayed.    Cardiac Enzymes: No results for input(s): CKTOTAL, CKMB, CKMBINDEX, TROPONINI in the last 168 hours.  BNP (last 3 results) No results for input(s): BNP in the last 8760 hours.  ProBNP (last 3 results) No results for input(s): PROBNP in the last 8760 hours.  Radiological Exams: No results found.  Assessment/Plan Active Problems:   Acute on chronic respiratory failure with hypoxia (HCC)   Chronic atrial fibrillation (HCC)   COVID-19 virus infection   Severe sepsis (HCC)   Multifocal  pneumonia   Metabolic encephalopathy   1. Acute on chronic respiratory failure hypoxia we will continue with the attempts at weaning on T collar 2. Chronic atrial fibrillation rate controlled 3. Severe sepsis resolved hemodynamics are stable. 4. COVID-19 virus infection resolved 5. Multifocal pneumonia treated 6. Metabolic encephalopathy no change   I have personally seen and evaluated the patient, evaluated laboratory and imaging results, formulated the assessment and plan and placed orders. The Patient requires high complexity decision making with multiple systems involvement.  Rounds were done with the Respiratory Therapy Director and Staff therapists and discussed with nursing staff also.  Yevonne Pax, MD [facility] Pulmonary Critical Care Medicine Sleep Medicine

## 2019-08-18 ENCOUNTER — Ambulatory Visit (HOSPITAL_COMMUNITY)
Admission: AD | Admit: 2019-08-18 | Discharge: 2019-08-18 | Disposition: A | Discharge status: Home / Self Care | Payer: Medicare Other | Source: Other Acute Inpatient Hospital | Attending: Internal Medicine | Admitting: Internal Medicine

## 2019-08-18 DIAGNOSIS — Z9911 Dependence on respirator [ventilator] status: Secondary | ICD-10-CM | POA: Insufficient documentation

## 2019-08-18 LAB — POTASSIUM: Potassium: 5.1 mmol/L (ref 3.5–5.1)

## 2019-08-29 ENCOUNTER — Emergency Department (HOSPITAL_COMMUNITY): Payer: Medicare Other

## 2019-08-29 ENCOUNTER — Inpatient Hospital Stay (HOSPITAL_COMMUNITY)
Admission: EM | Admit: 2019-08-29 | Discharge: 2019-09-07 | DRG: 208 | Disposition: E | Payer: Medicare Other | Source: Other Acute Inpatient Hospital | Attending: Pulmonary Disease | Admitting: Pulmonary Disease

## 2019-08-29 ENCOUNTER — Encounter (HOSPITAL_COMMUNITY): Payer: Self-pay

## 2019-08-29 DIAGNOSIS — N400 Enlarged prostate without lower urinary tract symptoms: Secondary | ICD-10-CM | POA: Diagnosis present

## 2019-08-29 DIAGNOSIS — Y95 Nosocomial condition: Secondary | ICD-10-CM | POA: Diagnosis present

## 2019-08-29 DIAGNOSIS — N179 Acute kidney failure, unspecified: Secondary | ICD-10-CM | POA: Diagnosis present

## 2019-08-29 DIAGNOSIS — Z930 Tracheostomy status: Secondary | ICD-10-CM

## 2019-08-29 DIAGNOSIS — E872 Acidosis: Secondary | ICD-10-CM | POA: Diagnosis present

## 2019-08-29 DIAGNOSIS — K219 Gastro-esophageal reflux disease without esophagitis: Secondary | ICD-10-CM | POA: Diagnosis present

## 2019-08-29 DIAGNOSIS — J189 Pneumonia, unspecified organism: Secondary | ICD-10-CM

## 2019-08-29 DIAGNOSIS — I5022 Chronic systolic (congestive) heart failure: Secondary | ICD-10-CM | POA: Diagnosis present

## 2019-08-29 DIAGNOSIS — T68XXXA Hypothermia, initial encounter: Secondary | ICD-10-CM

## 2019-08-29 DIAGNOSIS — E785 Hyperlipidemia, unspecified: Secondary | ICD-10-CM | POA: Diagnosis present

## 2019-08-29 DIAGNOSIS — I2781 Cor pulmonale (chronic): Secondary | ICD-10-CM | POA: Diagnosis present

## 2019-08-29 DIAGNOSIS — Z95810 Presence of automatic (implantable) cardiac defibrillator: Secondary | ICD-10-CM

## 2019-08-29 DIAGNOSIS — Z66 Do not resuscitate: Secondary | ICD-10-CM | POA: Diagnosis present

## 2019-08-29 DIAGNOSIS — J9622 Acute and chronic respiratory failure with hypercapnia: Secondary | ICD-10-CM | POA: Diagnosis present

## 2019-08-29 DIAGNOSIS — I959 Hypotension, unspecified: Secondary | ICD-10-CM | POA: Diagnosis present

## 2019-08-29 DIAGNOSIS — E039 Hypothyroidism, unspecified: Secondary | ICD-10-CM | POA: Diagnosis present

## 2019-08-29 DIAGNOSIS — Z888 Allergy status to other drugs, medicaments and biological substances status: Secondary | ICD-10-CM

## 2019-08-29 DIAGNOSIS — T17800A Unspecified foreign body in other parts of respiratory tract causing asphyxiation, initial encounter: Secondary | ICD-10-CM | POA: Diagnosis present

## 2019-08-29 DIAGNOSIS — J9621 Acute and chronic respiratory failure with hypoxia: Secondary | ICD-10-CM | POA: Diagnosis present

## 2019-08-29 DIAGNOSIS — N182 Chronic kidney disease, stage 2 (mild): Secondary | ICD-10-CM | POA: Diagnosis present

## 2019-08-29 DIAGNOSIS — I468 Cardiac arrest due to other underlying condition: Secondary | ICD-10-CM | POA: Diagnosis present

## 2019-08-29 DIAGNOSIS — D649 Anemia, unspecified: Secondary | ICD-10-CM | POA: Diagnosis not present

## 2019-08-29 DIAGNOSIS — M1990 Unspecified osteoarthritis, unspecified site: Secondary | ICD-10-CM | POA: Diagnosis present

## 2019-08-29 DIAGNOSIS — R680 Hypothermia, not associated with low environmental temperature: Secondary | ICD-10-CM | POA: Diagnosis present

## 2019-08-29 DIAGNOSIS — R569 Unspecified convulsions: Secondary | ICD-10-CM | POA: Diagnosis present

## 2019-08-29 DIAGNOSIS — D631 Anemia in chronic kidney disease: Secondary | ICD-10-CM | POA: Diagnosis present

## 2019-08-29 DIAGNOSIS — Z9911 Dependence on respirator [ventilator] status: Secondary | ICD-10-CM | POA: Diagnosis not present

## 2019-08-29 DIAGNOSIS — L89153 Pressure ulcer of sacral region, stage 3: Secondary | ICD-10-CM | POA: Diagnosis present

## 2019-08-29 DIAGNOSIS — I2510 Atherosclerotic heart disease of native coronary artery without angina pectoris: Secondary | ICD-10-CM | POA: Diagnosis present

## 2019-08-29 DIAGNOSIS — Z794 Long term (current) use of insulin: Secondary | ICD-10-CM

## 2019-08-29 DIAGNOSIS — Z79890 Hormone replacement therapy (postmenopausal): Secondary | ICD-10-CM

## 2019-08-29 DIAGNOSIS — B948 Sequelae of other specified infectious and parasitic diseases: Secondary | ICD-10-CM

## 2019-08-29 DIAGNOSIS — L8990 Pressure ulcer of unspecified site, unspecified stage: Secondary | ICD-10-CM | POA: Insufficient documentation

## 2019-08-29 DIAGNOSIS — Z79899 Other long term (current) drug therapy: Secondary | ICD-10-CM

## 2019-08-29 DIAGNOSIS — I469 Cardiac arrest, cause unspecified: Principal | ICD-10-CM | POA: Diagnosis present

## 2019-08-29 DIAGNOSIS — I482 Chronic atrial fibrillation, unspecified: Secondary | ICD-10-CM | POA: Diagnosis present

## 2019-08-29 DIAGNOSIS — I130 Hypertensive heart and chronic kidney disease with heart failure and stage 1 through stage 4 chronic kidney disease, or unspecified chronic kidney disease: Secondary | ICD-10-CM | POA: Diagnosis present

## 2019-08-29 DIAGNOSIS — Z20822 Contact with and (suspected) exposure to covid-19: Secondary | ICD-10-CM | POA: Diagnosis present

## 2019-08-29 DIAGNOSIS — I420 Dilated cardiomyopathy: Secondary | ICD-10-CM | POA: Diagnosis present

## 2019-08-29 DIAGNOSIS — Z7982 Long term (current) use of aspirin: Secondary | ICD-10-CM

## 2019-08-29 DIAGNOSIS — E1122 Type 2 diabetes mellitus with diabetic chronic kidney disease: Secondary | ICD-10-CM | POA: Diagnosis present

## 2019-08-29 DIAGNOSIS — F250 Schizoaffective disorder, bipolar type: Secondary | ICD-10-CM | POA: Diagnosis present

## 2019-08-29 DIAGNOSIS — Z931 Gastrostomy status: Secondary | ICD-10-CM

## 2019-08-29 LAB — CBC WITH DIFFERENTIAL/PLATELET
Abs Immature Granulocytes: 0.1 10*3/uL — ABNORMAL HIGH (ref 0.00–0.07)
Basophils Absolute: 0 10*3/uL (ref 0.0–0.1)
Basophils Relative: 0 %
Eosinophils Absolute: 0 10*3/uL (ref 0.0–0.5)
Eosinophils Relative: 0 %
HCT: 26.5 % — ABNORMAL LOW (ref 39.0–52.0)
Hemoglobin: 7.4 g/dL — ABNORMAL LOW (ref 13.0–17.0)
Lymphocytes Relative: 25 %
Lymphs Abs: 2.4 10*3/uL (ref 0.7–4.0)
MCH: 30.2 pg (ref 26.0–34.0)
MCHC: 27.9 g/dL — ABNORMAL LOW (ref 30.0–36.0)
MCV: 108.2 fL — ABNORMAL HIGH (ref 80.0–100.0)
Metamyelocytes Relative: 1 %
Monocytes Absolute: 0.6 10*3/uL (ref 0.1–1.0)
Monocytes Relative: 6 %
Neutro Abs: 6.5 10*3/uL (ref 1.7–7.7)
Neutrophils Relative %: 68 %
Platelets: 237 10*3/uL (ref 150–400)
RBC: 2.45 MIL/uL — ABNORMAL LOW (ref 4.22–5.81)
RDW: 16.8 % — ABNORMAL HIGH (ref 11.5–15.5)
WBC: 9.5 10*3/uL (ref 4.0–10.5)
nRBC: 1.5 % — ABNORMAL HIGH (ref 0.0–0.2)
nRBC: 4 /100 WBC — ABNORMAL HIGH

## 2019-08-29 LAB — I-STAT CHEM 8, ED
BUN: 41 mg/dL — ABNORMAL HIGH (ref 8–23)
Calcium, Ion: 1.05 mmol/L — ABNORMAL LOW (ref 1.15–1.40)
Chloride: 99 mmol/L (ref 98–111)
Creatinine, Ser: 1.4 mg/dL — ABNORMAL HIGH (ref 0.61–1.24)
Glucose, Bld: 130 mg/dL — ABNORMAL HIGH (ref 70–99)
HCT: 23 % — ABNORMAL LOW (ref 39.0–52.0)
Hemoglobin: 7.8 g/dL — ABNORMAL LOW (ref 13.0–17.0)
Potassium: 4 mmol/L (ref 3.5–5.1)
Sodium: 146 mmol/L — ABNORMAL HIGH (ref 135–145)
TCO2: 33 mmol/L — ABNORMAL HIGH (ref 22–32)

## 2019-08-29 LAB — I-STAT ARTERIAL BLOOD GAS, ED
Acid-Base Excess: 9 mmol/L — ABNORMAL HIGH (ref 0.0–2.0)
Bicarbonate: 38.1 mmol/L — ABNORMAL HIGH (ref 20.0–28.0)
Calcium, Ion: 1.14 mmol/L — ABNORMAL LOW (ref 1.15–1.40)
HCT: 23 % — ABNORMAL LOW (ref 39.0–52.0)
Hemoglobin: 7.8 g/dL — ABNORMAL LOW (ref 13.0–17.0)
O2 Saturation: 88 %
Patient temperature: 92
Potassium: 4 mmol/L (ref 3.5–5.1)
Sodium: 147 mmol/L — ABNORMAL HIGH (ref 135–145)
TCO2: 41 mmol/L — ABNORMAL HIGH (ref 22–32)
pCO2 arterial: 76 mmHg (ref 32.0–48.0)
pH, Arterial: 7.289 — ABNORMAL LOW (ref 7.350–7.450)
pO2, Arterial: 54 mmHg — ABNORMAL LOW (ref 83.0–108.0)

## 2019-08-29 LAB — COMPREHENSIVE METABOLIC PANEL
ALT: 22 U/L (ref 0–44)
AST: 26 U/L (ref 15–41)
Albumin: 1.6 g/dL — ABNORMAL LOW (ref 3.5–5.0)
Alkaline Phosphatase: 183 U/L — ABNORMAL HIGH (ref 38–126)
Anion gap: 6 (ref 5–15)
BUN: 41 mg/dL — ABNORMAL HIGH (ref 8–23)
CO2: 36 mmol/L — ABNORMAL HIGH (ref 22–32)
Calcium: 7.8 mg/dL — ABNORMAL LOW (ref 8.9–10.3)
Chloride: 100 mmol/L (ref 98–111)
Creatinine, Ser: 1.32 mg/dL — ABNORMAL HIGH (ref 0.61–1.24)
GFR calc Af Amer: >60 mL/min (ref >60)
GFR calc non Af Amer: 53 mL/min — ABNORMAL LOW (ref >60)
Glucose, Bld: 135 mg/dL — ABNORMAL HIGH (ref 70–99)
Potassium: 4.1 mmol/L (ref 3.5–5.1)
Sodium: 142 mmol/L (ref 135–145)
Total Bilirubin: 0.6 mg/dL (ref 0.3–1.2)
Total Protein: 7.1 g/dL (ref 6.5–8.1)

## 2019-08-29 LAB — DIGOXIN LEVEL: Digoxin Level: 1.1 ng/mL (ref 0.8–2.0)

## 2019-08-29 LAB — SARS CORONAVIRUS 2 BY RT PCR (HOSPITAL ORDER, PERFORMED IN ~~LOC~~ HOSPITAL LAB): SARS Coronavirus 2: NEGATIVE

## 2019-08-29 LAB — TROPONIN I (HIGH SENSITIVITY): Troponin I (High Sensitivity): 23 ng/L — ABNORMAL HIGH (ref <18)

## 2019-08-29 LAB — LACTIC ACID, PLASMA: Lactic Acid, Venous: 3.3 mmol/L (ref 0.5–1.9)

## 2019-08-29 MED ORDER — PIPERACILLIN-TAZOBACTAM 3.375 G IVPB: 3.3750 g | Freq: Three times a day (TID) | INTRAVENOUS | Status: DC

## 2019-08-29 MED ORDER — VANCOMYCIN HCL 1500 MG/300ML IV SOLN
1500.0000 mg | Freq: Once | INTRAVENOUS | Status: AC
  Administered 2019-08-29: 1500 mg via INTRAVENOUS
  Filled 2019-08-29: qty 300

## 2019-08-29 MED ORDER — METRONIDAZOLE IN NACL 5-0.79 MG/ML-% IV SOLN
500.0000 mg | Freq: Once | INTRAVENOUS | Status: AC
  Administered 2019-08-29: 500 mg via INTRAVENOUS
  Filled 2019-08-29: qty 100

## 2019-08-29 MED ORDER — POLYETHYLENE GLYCOL 3350 17 G PO PACK: 17.0000 g | PACK | Freq: Every day | ORAL | Status: DC | PRN

## 2019-08-29 MED ORDER — VANCOMYCIN HCL 750 MG/150ML IV SOLN
750.0000 mg | Freq: Two times a day (BID) | INTRAVENOUS | Status: DC
  Administered 2019-08-30: 750 mg via INTRAVENOUS
  Filled 2019-08-29 (×2): qty 150

## 2019-08-29 MED ORDER — HEPARIN SODIUM (PORCINE) 5000 UNIT/ML IJ SOLN
5000.0000 [IU] | Freq: Three times a day (TID) | INTRAMUSCULAR | Status: DC
  Administered 2019-08-30 (×2): 5000 [IU] via SUBCUTANEOUS
  Filled 2019-08-29 (×2): qty 1

## 2019-08-29 MED ORDER — SODIUM CHLORIDE 0.9 % IV SOLN
2.0000 g | Freq: Once | INTRAVENOUS | Status: DC
  Filled 2019-08-29: qty 2

## 2019-08-29 MED ORDER — LEVOTHYROXINE SODIUM 25 MCG PO TABS
125.0000 ug | ORAL_TABLET | Freq: Every day | ORAL | Status: DC
  Administered 2019-08-30: 125 ug via ORAL
  Filled 2019-08-29: qty 1

## 2019-08-29 MED ORDER — PIPERACILLIN-TAZOBACTAM 3.375 G IVPB 30 MIN
3.3750 g | Freq: Once | INTRAVENOUS | Status: AC
  Administered 2019-08-29: 3.375 g via INTRAVENOUS
  Filled 2019-08-29: qty 50

## 2019-08-29 MED ORDER — DOCUSATE SODIUM 100 MG PO CAPS: 100.0000 mg | ORAL_CAPSULE | Freq: Two times a day (BID) | ORAL | Status: DC | PRN

## 2019-08-29 MED ORDER — VANCOMYCIN HCL IN DEXTROSE 1-5 GM/200ML-% IV SOLN
1000.0000 mg | Freq: Once | INTRAVENOUS | Status: DC
  Filled 2019-08-29: qty 200

## 2019-08-29 MED ORDER — PANTOPRAZOLE SODIUM 40 MG PO TBEC
40.0000 mg | DELAYED_RELEASE_TABLET | Freq: Every day | ORAL | Status: DC
  Administered 2019-08-30: 40 mg via ORAL
  Filled 2019-08-29: qty 1

## 2019-08-29 MED ORDER — SODIUM CHLORIDE 0.9 % IV SOLN
INTRAVENOUS | Status: AC | PRN
  Administered 2019-08-29: 500 mL via INTRAVENOUS

## 2019-08-29 MED ORDER — NOREPINEPHRINE 4 MG/250ML-% IV SOLN
0.0000 ug/min | INTRAVENOUS | Status: DC
  Administered 2019-08-29: 2 ug/min via INTRAVENOUS
  Administered 2019-08-30: 16 ug/min via INTRAVENOUS
  Administered 2019-08-30: 12 ug/min via INTRAVENOUS
  Filled 2019-08-29 (×3): qty 250

## 2019-08-29 NOTE — Code Documentation (Signed)
Bair hugger applied.

## 2019-08-29 NOTE — Progress Notes (Signed)
Pharmacy Antibiotic Note  Henry Mcbride is a 74 y.o. male admitted on 09/01/2019 post CPR, concern for pna.  Pharmacy has been consulted for vancomycin and zosyn dosing.  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  Plan: Vancomycin 1500 mg IV x 1, then 750 mg IV every 12 hours (target vancomycin trough 15-20) Zosyn 3.375g IV every 8 hours (extended infusion) Monitor renal function, Cx and clinical progression to narrow Vancomycin trough at steady state     Temp (24hrs), Avg:92.3 F (33.5 C), Min:92.3 F (33.5 C), Max:92.3 F (33.5 C)  Recent Labs  Lab 09/02/2019 2104 08/29/2019 2123  WBC PENDING  --   CREATININE 1.32* 1.40*  LATICACIDVEN 3.3*  --     CrCl cannot be calculated (Unknown ideal weight.).    Allergies  Allergen Reactions  . Lithium Diarrhea and Other (See Comments)    Frequency in urination  Frequent urination    Antimicrobials this admission: Vanc 7/23>> Zosyn 7/23>>  Dose adjustments this admission: n/a  Microbiology results: 7/23 BCx: sent  Daylene Posey, PharmD Clinical Pharmacist ED Pharmacist Phone # [phone] 08/29/2019 10:08 PM

## 2019-08-29 NOTE — ED Notes (Signed)
IV team at bedside 

## 2019-08-29 NOTE — ED Triage Notes (Signed)
Pt comes via [facility] from [facility], pt had witnessed cardiac arrest at appx 1955, received axx 15 minutes of CPR and one shock by fire AED, in PEA upon EMS arrival, received an additional 6 minutes and one epi, now on epi drip. ROSC at 2019

## 2019-08-29 NOTE — H&P (Addendum)
NAME:  Henry Mcbride, MRN:  [id number], DOB:  [birth year], LOS: 0 ADMISSION DATE:  08/07/2019, CONSULTATION DATE:  08/07/2019 REFERRING MD:  EDP, CHIEF COMPLAINT:  Cardiac arrest   Brief History   74 y.o. M with PMH of chronic respiratory failure s/p tracheostomy and resides at [facility] who had a witnessed cardiac arrest with approximately 20 minutes CPR, shocked with AED one time and then in PEA on EMS arrival.  PCCM consulted for admission.   History of present illness   Henry Mcbride is a 74 y.o. M with PMH of seizures, HTN, HFrEF 20-25%%, ischemic cardiomyopathy s/p ICD, who contracted covid-19 pneumonia 05/2019 and required multiple intubations and subsequent tracheostomy.   [facility] has remained vent dependent. Sputum cultures 06/2019 were positive for ESBL E. coli and was treated with IV Avycaz.  While at [facility] on 7/23 he had a witnessed cardiac arrest, he received approximately 15 minutes CPR and one shock from AED, on EMS arrival patient was in PEA and received additional 6 minutes of CPR and one Epi and arrived to the ED on epi drip.  Per EMS report, patient's baseline is nonverbal.  During ED course pt began to follow some commands.  Head CT was negative, bloodwork with creatinine up to 1.3 from baseline ~1 and respiratory acidosis on ABG, Hgb 7.4, Lactic acid 3.3. EKG with Qtc of 688. CXR significant for interval development of extensive R lung infiltrate.  He was given Vancomycin, Zosyn and Flagyl and PCCM consulted for admission  Past Medical History   has a past medical history of Acute on chronic respiratory failure with hypoxia (HCC), Anxiety, Arthritis, CHF (congestive heart failure) (HCC), Chronic atrial fibrillation (HCC), CKD (chronic kidney disease), COVID-19 virus infection, Cyst of kidney, acquired, Depression, Diabetes mellitus without complication (HCC), Hypertension, Hypothyroidism, Mental disorder, Metabolic encephalopathy, Multifocal pneumonia, Schizo  affective schizophrenia (HCC), Seizures (HCC), Severe sepsis (HCC), Shortness of breath, and Tardive dyskinesia.   Significant Hospital Events   7/23 Out of hospital cardiac arrest, admit to PCCM  Consults:  Cardiology  Procedures:    Significant Diagnostic Tests:  7/23 CXR>>Interval development of extensive infiltrate asymmetrically throughout the right lung suspicious for and asymmetric pneumonic infiltrate. 7/23 CT head>>Atrophy and chronic small vessel ischemic change of the white matter  Micro Data:  7/23 Sars-CoV-2>>negative 7/23 BCx2>>  Antimicrobials:  Vancomycin 7/23 Flagyl 7/23 Zosyn 7/23   Interim history/subjective:  Pt. Became more responsive throughout ED course and now back to mental baseline  Objective   Blood pressure (!) 88/64, pulse 78, temperature (!) 92.3 F (33.5 C), temperature source Rectal, resp. rate (!) 25, SpO2 100 %.    Vent Mode: PCV FiO2 (%):  [60 %] 60 % Set Rate:  [12 bmp-20 bmp] 20 bmp Vt Set:  [500 mL] 500 mL PEEP:  [5 cmH20] 5 cmH20 Plateau Pressure:  [20 cmH20] 20 cmH20  No intake or output data in the 24 hours ending 08/27/2019 2238 There were no vitals filed for this visit.  General:  Well-nourished M, bed-bound, trach dependent  HEENT: MM pink/moist Neuro: awake, following commands, mouthing responses  CV: s1s2 bradycardic, regular, no m/r/g PULM:  Rhonchi on the R, course breath sounds and trace expiratory wheezing L lung fields  GI: soft, bsx4 active  Extremities: warm/dry, no edema  Skin: no rashes or lesions, reported decubitus ulcer   Resolved Hospital Problem list     Assessment & Plan:   Witnessed out of hospital PEA cardiac arrest Approximately  20 minutes ACLS, now responsive and at mental baseline per brother.  Unclear if this was primarily of cardiac or respiratory etiology. CT head negative, no evidence of ACS on EKG -Continue supportive care, trend troponins, Levophed to maintain MAP >65 -Consult Cardiology  for ICD interrogation, spoke with Cards fellow on call, this can be done after 7am -Presented hypothermic, target normothermia and fever avoidance -Long discussion with patient's brother, he and his sister are patient's primary decision-makers, they acknowledge his decline this year and request no further defibrillation or chest compressions   Acute on Chronic Hypoxic Respiratory Failure and R-sided Pneumonia History of Covid-19 PNA earlier this year and subsequent trach and ESBL E. Coli and MRSA on respiratory cultures two months ago, now resides at [facility] and is vent dependent  -Send blood and Sputum cultures -Lactic acid elevated and mild hypotension, possibly from developing sepsis vs mild cardiogenic shock -Giving 500cc bolus in the ED, on peripheral Levophed, continue to maintain MAP >65 -Cover with Vanc and Meropenem  -Good oxygen saturations on FiO2 of 50%    Chronic Kidney Injury Creatinine near baseline -place foley and monitor UOP and renal indices   Prolonged Qtc QTC 688, on review of prior EKG's  this appears chronic -Check Magnesium level   HFrEF, chronic atrial fibrillation, Vtach EF 07/2019 with EF 20-25%, not anti-coagulated, ICD in place -holding home Amiodarone and Digoxin in the setting of bradycardia and hypotension    Schizoaffective Disorder Continue home Klonopin, Seroquel   Hypothyroidism TSH 3.7 two weeks ago -continue Synthroid   Chronic Anemia Baseline Hgb between 8.0-9.0, 7.4 today.  Brother unsure of etiology, no recent signs of bleeding and not on anticoagulants or anti-platelets -Monitor H&H and obtain type and screen, may benefit from transfusion if hypotension is not improving  Best practice:  Diet: NPO Pain/Anxiety/Delirium protocol (if indicated): fentanyl prn VAP protocol (if indicated): HOB 30 degrees, suction prn DVT prophylaxis: heparin, SCD's GI prophylaxis: protonix Glucose control: SSI Mobility: bed rest Code Status:  DNR Family Communication: brother updated at the bedside Disposition: ICU  Labs   CBC: Recent Labs  Lab 2019/09/24 2104 2019/09/24 2120 September 24, 2019 2123  WBC 9.5  --   --   NEUTROABS 6.5  --   --   HGB 7.4* 7.8* 7.8*  HCT 26.5* 23.0* 23.0*  MCV 108.2*  --   --   PLT 237  --   --     Basic Metabolic Panel: Recent Labs  Lab 24-Sep-2019 2104 2019/09/24 2120 24-Sep-2019 2123  NA 142 147* 146*  K 4.1 4.0 4.0  CL 100  --  99  CO2 36*  --   --   GLUCOSE 135*  --  130*  BUN 41*  --  41*  CREATININE 1.32*  --  1.40*  CALCIUM 7.8*  --   --    GFR: CrCl cannot be calculated (Unknown ideal weight.). Recent Labs  Lab 2019-09-24 2104  WBC 9.5  LATICACIDVEN 3.3*    Liver Function Tests: Recent Labs  Lab Sep 24, 2019 2104  AST 26  ALT 22  ALKPHOS 183*  BILITOT 0.6  PROT 7.1  ALBUMIN 1.6*   No results for input(s): LIPASE, AMYLASE in the last 168 hours. No results for input(s): AMMONIA in the last 168 hours.  ABG    Component Value Date/Time   PHART 7.289 (L) 2019/09/24 2120   PCO2ART 76.0 (HH) 09-24-2019 2120   PO2ART 54 (L) Sep 24, 2019 2120   HCO3 38.1 (H) 24-Sep-2019 2120   TCO2  33 (H) 08/31/2019 2123   O2SAT 88.0 08/11/2019 2120     Coagulation Profile: No results for input(s): INR, PROTIME in the last 168 hours.  Cardiac Enzymes: No results for input(s): CKTOTAL, CKMB, CKMBINDEX, TROPONINI in the last 168 hours.  HbA1C: Hgb A1c MFr Bld  Date/Time Value Ref Range Status  06/23/2019 03:03 PM 5.9 (H) 4.8 - 5.6 % Final    Comment:    (NOTE)         Prediabetes: 5.7 - 6.4         Diabetes: >6.4         Glycemic control for adults with diabetes: <7.0     CBG: No results for input(s): GLUCAP in the last 168 hours.  Review of Systems:   Unable to obtain secondary to non-verbal status  Past Medical History  He,  has a past medical history of Acute on chronic respiratory failure with hypoxia (HCC), Anxiety, Arthritis, CHF (congestive heart failure) (HCC), Chronic  atrial fibrillation (HCC), CKD (chronic kidney disease), COVID-19 virus infection, Cyst of kidney, acquired, Depression, Diabetes mellitus without complication (HCC), Hypertension, Hypothyroidism, Mental disorder, Metabolic encephalopathy, Multifocal pneumonia, Schizo affective schizophrenia (HCC), Seizures (HCC), Severe sepsis (HCC), Shortness of breath, and Tardive dyskinesia.   Surgical History    Past Surgical History:  Procedure Laterality Date  . ANKLE ARTHROSCOPY  right  . COLON RESECTION  July 2014   had mass surgery in [place]  . COLONOSCOPY    . HERNIA REPAIR     times 2  . IR THORACENTESIS ASP PLEURAL SPACE W/IMG GUIDE  07/09/2019  . IR THORACENTESIS ASP PLEURAL SPACE W/IMG GUIDE  08/13/2019  . POSTERIOR CERVICAL FUSION/FORAMINOTOMY N/A 10/28/2012   Procedure: CERVICAL TWO THREE, CERVICAL THREE FOUR, CERVICAL FOUR FIVE, CERVICAL FIVE SIX, CERVICAL SIX SEVEN POSTERIOR CERVICAL FUSION WITH LATERAL MASS FIXATION;  Surgeon: Temple Pacini, MD;  Location: [facility];  Service: Neurosurgery;  Laterality: N/A;  POSTERIOR CERVICAL FUSION/FORAMINOTOMY LEVEL 5  . TRANSTHORACIC ECHOCARDIOGRAM  06/20/12   mild to moderate LV systolic dysfunction with akinesisa of basilar and mild inferolateral segments, EF 45% (visual 40%), impaired relaxation, mildly dilated LA  . WRIST ARTHROPLASTY     at age 64     Social History   reports that he has never smoked. He has never used smokeless tobacco. He reports that he does not drink alcohol and does not use drugs.   Family History   His family history is not on file.   Allergies Allergies  Allergen Reactions  . Lithium Diarrhea and Other (See Comments)    Frequency in urination  Frequent urination     Home Medications  Prior to Admission medications   Medication Sig Start Date End Date Taking? Authorizing Provider  acetaminophen (TYLENOL) 325 MG tablet Take 650 mg by mouth every 6 (six) hours as needed for pain.   Yes [provider]   Amino Acids-Protein Hydrolys (FEEDING SUPPLEMENT, PRO-STAT SUGAR FREE 64,) LIQD Place 30 mLs into feeding tube in the morning and at bedtime.   Yes [provider]  amiodarone (PACERONE) 400 MG tablet Place 400 mg into feeding tube daily.   Yes [provider]  atorvastatin (LIPITOR) 40 MG tablet Place 40 mg into feeding tube daily.  03/24/16  Yes [provider]  chlorhexidine (PERIDEX) 0.12 % solution Use as directed 15 mLs in the mouth or throat in the morning, at noon, and at bedtime.   Yes [provider]  clonazePAM (KLONOPIN) 0.5  MG tablet Place 0.5 mg into feeding tube every 8 (eight) hours.   Yes [provider]  digoxin (LANOXIN) 0.125 MG tablet Place 0.125 mg into feeding tube daily.   Yes [provider]  finasteride (PROSCAR) 5 MG tablet 5 mg daily. Per tube   Yes [provider]  lactated ringers infusion Inject into the vein continuous. 1 liter over 10 hours   Yes [provider]  levothyroxine (SYNTHROID, LEVOTHROID) 125 MCG tablet Take 125 mcg by mouth daily before breakfast.    Yes [provider]  loratadine (CLARITIN) 10 MG tablet Place 10 mg into feeding tube every other day.   Yes [provider]  Nutritional Supplements (FEEDING SUPPLEMENT, OSMOLITE 1.5 CAL,) LIQD Place 1,000 mLs into feeding tube continuous.   Yes [provider]  OLANZapine (ZYPREXA) 2.5 MG tablet Place 2.5 mg into feeding tube in the morning and at bedtime.   Yes [provider]  pantoprazole (PROTONIX) 40 MG tablet 40 mg daily. Per tube   Yes [provider]  QUEtiapine (SEROQUEL) 25 MG tablet Place 25 mg into feeding tube 2 (two) times daily.   Yes [provider]  Sodium Hypochlorite (H-CHLOR 12) 0.125 % SOLN Apply topically in the morning, at noon, and at bedtime.   Yes [provider]  sodium polystyrene (KIONEX) 15 GM/60ML suspension Take 30 g by mouth once.   Yes  [provider]  sodium zirconium cyclosilicate (LOKELMA) 10 g PACK packet Place 10 g into feeding tube daily.   Yes [provider]  tuberculin (APLISOL) 5 UNIT/0.1ML injection Inject 5 Units into the skin once.   Yes [provider]  cyclobenzaprine (FLEXERIL) 10 MG tablet Take 1 tablet (10 mg total) by mouth 3 (three) times daily as needed for muscle spasms. Patient not taking: Reported on 08/07/2019 10/31/12   Julio Sicks, MD  Emollient (MOISTURIZING LOTION) LOTN Apply Gold Bond lotion to feet nightly for dry skin.  Do not apply between the toes. Patient not taking: Reported on 08/09/2019 03/28/18   Park Liter, DPM  silver sulfADIAZINE (SILVADENE) 1 % cream Apply pea-sized amount to wound daily. Patient not taking: Reported on 08/18/2019 02/19/18   Park Liter, DPM     Critical care time: 65 minutes     CRITICAL CARE Performed by: Darcella Gasman Tabathia Knoche   Total critical care time: 65 minutes  Critical care time was exclusive of separately billable procedures and treating other patients.  Critical care was necessary to treat or prevent imminent or life-threatening deterioration.  Critical care was time spent personally by me on the following activities: development of treatment plan with patient and/or surrogate as well as nursing, discussions with consultants, evaluation of patient's response to treatment, examination of patient, obtaining history from patient or surrogate, ordering and performing treatments and interventions, ordering and review of laboratory studies, ordering and review of radiographic studies, pulse oximetry and re-evaluation of patient's condition.  Darcella Gasman Boleslaw Borghi, PA-C

## 2019-08-29 NOTE — ED Notes (Signed)
Bair hugger removed  

## 2019-08-29 NOTE — ED Provider Notes (Signed)
[facility] EMERGENCY DEPARTMENT Provider Note   CSN: [id number] Arrival date & time: 09/18/2019  2052     History Chief Complaint  Patient presents with  . Cardiac Arrest    Henry Mcbride is a 74 y.o. male.  Patient is a 74 year old male who presents status post cardiac arrest.  He currently resides at [facility].  He has a history of chronic kidney disease, seizures, schizoaffective disorder, CHF and hypertension.  He previously had a Covid infection associated with sepsis and pneumonia, leading to respiratory failure.  He subsequently required tracheostomy and has been living at [facility].  Per EMS, patient is nonverbal and noninteractive.  He is vent dependent.  He had an unwitnessed cardiac arrest.  Staff at [facility] did CPR for approximately 15 minutes prior to the fire department arrival.  He did have 1 shock via the fire department AED.  When paramedics arrived, he was in PEA rhythm.  He CPR was performed for about another 6 minutes and he was given 1 mg of epinephrine, at which time he regained pulses.  He maintained pulses throughout EMS transport.  He was maintained on epi drip.        Past Medical History:  Diagnosis Date  . Acute on chronic respiratory failure with hypoxia (HCC)   . Anxiety   . Arthritis   . CHF (congestive heart failure) (HCC)    dilated non-ischemic cardiomyopathy; followed at [facility]  . Chronic atrial fibrillation (HCC)   . CKD (chronic kidney disease)   . COVID-19 virus infection   . Cyst of kidney, acquired   . Depression   . Diabetes mellitus without complication (HCC)   . Hypertension   . Hypothyroidism   . Mental disorder   . Metabolic encephalopathy   . Multifocal pneumonia   . Schizo affective schizophrenia (HCC)   . Seizures (HCC)    last was 3-4 years ago  . Severe sepsis (HCC)   . Shortness of breath   . Tardive dyskinesia     Patient Active Problem List   Diagnosis  Date Noted  . Acute on chronic respiratory failure with hypoxia (HCC)   . Chronic atrial fibrillation (HCC)   . COVID-19 virus infection   . Severe sepsis (HCC)   . Chronic post concussive encephalopathy   . Multifocal pneumonia   . Metabolic encephalopathy   . Chronic venous insufficiency 10/08/2018  . BPH (benign prostatic hyperplasia) 02/19/2018  . Dysphagia 02/19/2018  . Osteoarthritis 02/19/2018  . Hyperlipidemia 03/28/2017  . SDH (subdural hematoma) (HCC) 08/20/2016  . Mild CAD 01/12/2016  . On amiodarone therapy 07/27/2015  . Chronic systolic heart failure (HCC) 07/24/2015  . Hypoxemia 05/29/2015  . Lactic acidosis 05/29/2015  . Pneumonia due to infectious organism 05/29/2015  . Severe sepsis (HCC) 05/29/2015  . 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 Acute coronary syndrome (HCC) 03/20/2015  . Acute kidney injury (HCC) 03/20/2015  . Cardiogenic shock (HCC) 03/20/2015  . GERD (gastroesophageal reflux disease) 03/20/2015  . Sustained ventricular tachycardia (HCC) 03/20/2015  . Type 2 diabetes mellitus (HCC) 03/20/2015  . Schizoaffective disorder, bipolar type (HCC) 04/07/2013  . Unspecified symptoms and signs involving cognitive functions and awareness 04/07/2013  . Unspecified hypothyroidism 12/18/2012  . Rash and other nonspecific skin eruption 12/18/2012  . Anemia in chronic kidney disease(285.21) 12/18/2012  . Chronic kidney disease, unspecified 12/18/2012  . Spinal stenosis in cervical region 10/28/2012  . Kyphosis 10/28/2012    Past Surgical  History:  Procedure Laterality Date  . ANKLE ARTHROSCOPY  right  . COLON RESECTION  July 2014   [place]  . COLONOSCOPY    . HERNIA REPAIR     times 2  . IR THORACENTESIS ASP PLEURAL SPACE W/IMG GUIDE  07/09/2019  . IR THORACENTESIS ASP PLEURAL SPACE W/IMG GUIDE  08/13/2019  . POSTERIOR CERVICAL FUSION/FORAMINOTOMY N/A 10/28/2012   Procedure: CERVICAL TWO THREE, CERVICAL  THREE FOUR, CERVICAL FOUR FIVE, CERVICAL FIVE SIX, CERVICAL SIX SEVEN POSTERIOR CERVICAL FUSION WITH LATERAL MASS FIXATION;  Surgeon: Temple PaciniHenry A Pool, MD;  Location: [facility];  Service: Neurosurgery;  Laterality: N/A;  POSTERIOR CERVICAL FUSION/FORAMINOTOMY LEVEL 5  . TRANSTHORACIC ECHOCARDIOGRAM  06/20/12   mild to moderate LV systolic dysfunction with akinesisa of basilar and mild inferolateral segments, EF 45% (visual 40%), impaired relaxation, mildly dilated LA  . WRIST ARTHROPLASTY     at age 74       No family history on file.  Social History   Tobacco Use  . Smoking status: Never Smoker  . Smokeless tobacco: Never Used  Substance Use Topics  . Alcohol use: No  . Drug use: No    Home Medications Prior to Admission medications   Medication Sig Start Date End Date Taking? Authorizing Provider  acetaminophen (TYLENOL) 325 MG tablet Take 650 mg by mouth every 6 (six) hours as needed for pain.    [provider]  amiodarone (PACERONE) 200 MG tablet Take 200 mg by mouth. 05/05/15   [provider]  aspirin 81 MG chewable tablet Chew 81 mg by mouth. 03/26/15   [provider]  atorvastatin (LIPITOR) 40 MG tablet Take by mouth. 03/24/16   [provider]  benztropine (COGENTIN) 2 MG tablet Take 2 mg by mouth. 03/26/15   [provider]  carvedilol (COREG) 6.25 MG tablet Take 6.25 mg by mouth 2 (two) times daily with a meal.    [provider]  cyclobenzaprine (FLEXERIL) 10 MG tablet Take 1 tablet (10 mg total) by mouth 3 (three) times daily as needed for muscle spasms. 10/31/12   Julio SicksPool, Henry, MD  divalproex (DEPAKOTE ER) 250 MG 24 hr tablet Take 1,750 mg by mouth every evening.     [provider]  divalproex (DEPAKOTE) 500 MG DR tablet  12/27/18   [provider]  docusate sodium (COLACE) 100 MG capsule Take 100 mg by mouth.    [provider]  donepezil (ARICEPT) 10 MG tablet Take 10 mg by mouth at bedtime.      [provider]  dutasteride (AVODART) 0.5 MG capsule Take 0.5 mg by mouth daily.    [provider]  Emollient (MOISTURIZING LOTION) LOTN Apply Gold Bond lotion to feet nightly for dry skin.  Do not apply between the toes. 03/28/18   Park LiterPrice, Michael J, DPM  ergocalciferol (VITAMIN D2) 50000 UNITS capsule Take 50,000 Units by mouth once a week. Take 50,000 units on Fridays.    [provider]  ferrous sulfate 324 (65 Fe) MG TBEC Take 324 mg by mouth.    [provider]  finasteride (PROSCAR) 5 MG tablet Take 5 mg by mouth.    [provider]  furosemide (LASIX) 20 MG tablet Take 20 mg by mouth daily.    [provider]  gabapentin (NEURONTIN) 100 MG capsule  12/28/17   [provider]  ipratropium-albuterol (DUONEB) 0.5-2.5 (3) MG/3ML SOLN  02/05/18   [provider]  LANTUS  100 UNIT/ML injection  12/08/16   [provider]  levothyroxine (SYNTHROID, LEVOTHROID) 125 MCG tablet Take by mouth.    [provider]  levothyroxine (SYNTHROID, LEVOTHROID) 137 MCG tablet Take 137 mcg by mouth daily before breakfast.    [provider]  linagliptin (TRADJENTA) 5 MG TABS tablet Take 5 mg by mouth daily.    [provider]  magnesium oxide (MAG-OX) 400 MG tablet Take 400 mg by mouth.    [provider]  metoprolol succinate (TOPROL-XL) 25 MG 24 hr tablet Take 25 mg by mouth.    [provider]  OLANZapine zydis (ZYPREXA) 10 MG disintegrating tablet Take 10 mg by mouth at bedtime.    [provider]  oxybutynin (DITROPAN) 5 MG tablet Take 5 mg by mouth 2 (two) times daily.    [provider]  oxybutynin (DITROPAN-XL) 10 MG 24 hr tablet  12/27/18   [provider]  pantoprazole (PROTONIX) 20 MG tablet  12/28/17   [provider]  polyethylene glycol powder (GLYCOLAX/MIRALAX) powder Take 17 g by mouth daily. Mix 1 capful (17 grams) with 8 oz of water  and drink by mouth every other day.    [provider]  potassium chloride (MICRO-K) 10 MEQ CR capsule  12/27/18   [provider]  potassium chloride SA (K-DUR,KLOR-CON) 20 MEQ tablet  12/01/16   [provider]  PRODIGY LANCETS 28G MISC  03/20/18   [provider]  ranitidine (ZANTAC) 150 MG tablet  12/28/17   [provider]  silver sulfADIAZINE (SILVADENE) 1 % cream Apply pea-sized amount to wound daily. 02/19/18   Park Liter, DPM  solifenacin (VESICARE) 10 MG tablet Take 10 mg by mouth. 10/08/14   [provider]  tamsulosin (FLOMAX) 0.4 MG CAPS capsule Take 0.4 mg by mouth 2 (two) times daily.    [provider]  torsemide (DEMADEX) 20 MG tablet Take 20 mg by mouth. 01/12/16   [provider]    Allergies    Lithium  Review of Systems   Review of Systems  Unable to perform ROS: Mental status change    Physical Exam Updated Vital Signs BP (!) 88/64 Comment: manual   Pulse 78   Temp (!) 92.3 F (33.5 C) (Rectal)   Resp (!) 25   SpO2 100%   Physical Exam Constitutional:      Appearance: He is ill-appearing.  HENT:     Head: Normocephalic and atraumatic.     Mouth/Throat:     Mouth: Mucous membranes are moist.  Eyes:     Conjunctiva/sclera: Conjunctivae normal.     Pupils: Pupils are equal, round, and reactive to light.  Cardiovascular:     Rate and Rhythm: Normal rate.  Pulmonary:     Comments: Rhonchi present, breath sounds are present in the bilateral lung fields Abdominal:     General: There is distension.  Skin:    Comments: Patient is cool to the touch  Neurological:     Comments: Patient is unresponsive with eyes closed.  He does have some occasional movements of his head but does not seem to have purposeful movements.     ED Results / Procedures / Treatments   Labs (all labs ordered are listed, but only abnormal results are displayed) Labs Reviewed  CBC WITH DIFFERENTIAL/PLATELET  - Abnormal; Notable for the following components:      Result Value   RBC 2.45 (*)    Hemoglobin 7.4 (*)  HCT 26.5 (*)    MCV 108.2 (*)    MCHC 27.9 (*)    RDW 16.8 (*)    All other components within normal limits  LACTIC ACID, PLASMA - Abnormal; Notable for the following components:   Lactic Acid, Venous 3.3 (*)    All other components within normal limits  I-STAT CHEM 8, ED - Abnormal; Notable for the following components:   Sodium 146 (*)    BUN 41 (*)    Creatinine, Ser 1.40 (*)    Glucose, Bld 130 (*)    Calcium, Ion 1.05 (*)    TCO2 33 (*)    Hemoglobin 7.8 (*)    HCT 23.0 (*)    All other components within normal limits  I-STAT ARTERIAL BLOOD GAS, ED - Abnormal; Notable for the following components:   pH, Arterial 7.289 (*)    pCO2 arterial 76.0 (*)    pO2, Arterial 54 (*)    Bicarbonate 38.1 (*)    TCO2 41 (*)    Acid-Base Excess 9.0 (*)    Sodium 147 (*)    Calcium, Ion 1.14 (*)    HCT 23.0 (*)    Hemoglobin 7.8 (*)    All other components within normal limits  CULTURE, BLOOD (ROUTINE X 2)  CULTURE, BLOOD (ROUTINE X 2)  SARS CORONAVIRUS 2 BY RT PCR (HOSPITAL ORDER, PERFORMED IN [facility])  COMPREHENSIVE METABOLIC PANEL  LACTIC ACID, PLASMA  DIGOXIN LEVEL  VALPROIC ACID LEVEL  TROPONIN I (HIGH SENSITIVITY)    EKG EKG Interpretation  Date/Time:  Friday August 29 2019 20:54:22 EDT Ventricular Rate:  70 PR Interval:    QRS Duration: 111 QT Interval:  637 QTC Calculation: 688 R Axis:   50 Text Interpretation: Accelerated junctional rhythm Low voltage, extremity and precordial leads Prolonged QT interval Confirmed by Rolan Bucco [phone]) on 08/31/2019 9:01:27 PM   Radiology CT Head Wo Contrast  Result Date: 08/16/2019 CLINICAL DATA:  Mental status change EXAM: CT HEAD WITHOUT CONTRAST TECHNIQUE: Contiguous axial images were obtained from the base of the skull through the vertex without intravenous contrast. COMPARISON:  CT 05/16/2019  FINDINGS: Brain: No acute territorial infarction, hemorrhage, or intracranial mass. Moderate atrophy. Mild hypodensity in the white matter consistent with chronic small vessel ischemic change. Stable ventricle size Vascular: No hyperdense vessels.  Carotid vascular calcification. Skull: No fracture. Sinuses/Orbits: Mucous retention cyst in the left sphenoid sinus. Interval diffuse opacification and fluid in the right mastoid air cells. Other: Incompletely visualized posterior rods and fixating screws at the upper cervical spine. IMPRESSION: 1. No CT evidence for acute intracranial abnormality. Atrophy and chronic small vessel ischemic change of the white matter 2. New opacification and fluid in the right mastoid air cells. Electronically Signed   By: Jasmine Pang M.D.   On: 08/20/2019 21:53   DG Chest Port 1 View  Result Date: 08/16/2019 CLINICAL DATA:  Respiratory failure EXAM: PORTABLE CHEST 1 VIEW COMPARISON:  08/13/2019 FINDINGS: New tracheostomy appliance noted with probable retaining balloon expanding the proximal airway. Interval development of extensive infiltrate throughout the right lung and mild left perihilar interstitial infiltrate, likely infectious in the acute setting. Small right pleural effusion is suspected. Small left pleural effusion has enlarged in the interval. No pneumothorax. Left subclavian pacemaker defibrillator is unchanged. Mild to moderate cardiomegaly is stable. IMPRESSION: Interval development of extensive infiltrate asymmetrically throughout the right lung suspicious for and asymmetric pneumonic infiltrate. Interval development of small right and enlargement of small left pleural  effusions. Tracheostomy appliance retaining balloon expands the proximal airway. Electronically Signed   By: Helyn Numbers MD   On: 2019/09/04 21:32    Procedures Procedures (including critical care time)  Medications Ordered in ED Medications  norepinephrine (LEVOPHED) 4mg  in premix  infusion (5 mcg/min Intravenous Rate/Dose Change 09-04-2019 2144)  ceFEPIme (MAXIPIME) 2 g in sodium chloride 0.9 % 100 mL IVPB (has no administration in time range)  vancomycin (VANCOCIN) IVPB 1000 mg/200 mL premix (has no administration in time range)  metroNIDAZOLE (FLAGYL) IVPB 500 mg (has no administration in time range)  0.9 %  sodium chloride infusion (500 mLs Intravenous New Bag/Given 04-Sep-2019 2058)    ED Course  I have reviewed the triage vital signs and the nursing notes.  Pertinent labs & imaging results that were available during my care of the patient were reviewed by me and considered in my medical decision making (see chart for details).    MDM Rules/Calculators/A&P                          Patient is a 74 year old male who presents status post cardiac arrest from [facility].  He has been hypertensive since arrival.  He was given IV fluids and started on Levophed.  Initially he would open his eyes to verbal stimuli but now he actually is following commands and will squeeze her hand when asked.  His labs show anemia which is slightly lower than his baseline levels.  His chest x-ray shows evidence of infiltrate.  He was started on IV antibiotics on arrival.  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  We will start him on Zosyn and vancomycin.  Patient was noted to be markedly hypothermic on arrival.  He initially was placed on a bear hugger.  I spoke with the intensivist who recommended to keep him at his current temperature so the Bair hugger was removed.  Head CT shows no acute abnormalities other than some fluid in the mastoid cells.  Will be admitted to the ICU.  Patient's brother was updated.  CRITICAL CARE Performed by: 66 Total critical care time: 45 minutes Critical care time was exclusive of separately billable procedures and treating other patients. Critical care was necessary to treat or prevent imminent or  life-threatening deterioration. Critical care was time spent personally by me on the following activities: development of treatment plan with patient and/or surrogate as well as nursing, discussions with consultants, evaluation of patient's response to treatment, examination of patient, obtaining history from patient or surrogate, ordering and performing treatments and interventions, ordering and review of laboratory studies, ordering and review of radiographic studies, pulse oximetry and re-evaluation of patient's condition.  Final Clinical Impression(s) / ED Diagnoses Final diagnoses:  Cardiac arrest (HCC)  Healthcare-associated pneumonia  Hypothermia, initial encounter  Anemia, unspecified type    Rx / DC Orders ED Discharge Orders    None       Rolan Bucco, MD September 04, 2019 2205

## 2019-08-30 DIAGNOSIS — D649 Anemia, unspecified: Secondary | ICD-10-CM | POA: Insufficient documentation

## 2019-08-30 DIAGNOSIS — L8990 Pressure ulcer of unspecified site, unspecified stage: Secondary | ICD-10-CM | POA: Insufficient documentation

## 2019-08-30 LAB — CBC
HCT: 28.1 % — ABNORMAL LOW (ref 39.0–52.0)
Hemoglobin: 7.7 g/dL — ABNORMAL LOW (ref 13.0–17.0)
MCH: 29.7 pg (ref 26.0–34.0)
MCHC: 27.4 g/dL — ABNORMAL LOW (ref 30.0–36.0)
MCV: 108.5 fL — ABNORMAL HIGH (ref 80.0–100.0)
Platelets: 270 10*3/uL (ref 150–400)
RBC: 2.59 MIL/uL — ABNORMAL LOW (ref 4.22–5.81)
RDW: 17.2 % — ABNORMAL HIGH (ref 11.5–15.5)
WBC: 12.2 10*3/uL — ABNORMAL HIGH (ref 4.0–10.5)
nRBC: 1.2 % — ABNORMAL HIGH (ref 0.0–0.2)

## 2019-08-30 LAB — BASIC METABOLIC PANEL
Anion gap: 5 (ref 5–15)
BUN: 41 mg/dL — ABNORMAL HIGH (ref 8–23)
CO2: 37 mmol/L — ABNORMAL HIGH (ref 22–32)
Calcium: 7.7 mg/dL — ABNORMAL LOW (ref 8.9–10.3)
Chloride: 102 mmol/L (ref 98–111)
Creatinine, Ser: 1.43 mg/dL — ABNORMAL HIGH (ref 0.61–1.24)
GFR calc Af Amer: 56 mL/min — ABNORMAL LOW (ref >60)
GFR calc non Af Amer: 48 mL/min — ABNORMAL LOW (ref >60)
Glucose, Bld: 103 mg/dL — ABNORMAL HIGH (ref 70–99)
Potassium: 5.4 mmol/L — ABNORMAL HIGH (ref 3.5–5.1)
Sodium: 144 mmol/L (ref 135–145)

## 2019-08-30 LAB — BLOOD GAS, ARTERIAL
Acid-Base Excess: 7.7 mmol/L — ABNORMAL HIGH (ref 0.0–2.0)
Bicarbonate: 35.8 mmol/L — ABNORMAL HIGH (ref 20.0–28.0)
Drawn by: 59156
FIO2: 50
O2 Saturation: 97.1 %
Patient temperature: 32.2
pCO2 arterial: 79.2 mmHg (ref 32.0–48.0)
pH, Arterial: 7.243 — ABNORMAL LOW (ref 7.350–7.450)
pO2, Arterial: 69.4 mmHg — ABNORMAL LOW (ref 83.0–108.0)

## 2019-08-30 LAB — PHOSPHORUS: Phosphorus: 4.4 mg/dL (ref 2.5–4.6)

## 2019-08-30 LAB — TROPONIN I (HIGH SENSITIVITY)
Troponin I (High Sensitivity): 171 ng/L (ref <18)
Troponin I (High Sensitivity): 216 ng/L (ref <18)

## 2019-08-30 LAB — GLUCOSE, CAPILLARY
Glucose-Capillary: 84 mg/dL (ref 70–99)
Glucose-Capillary: 77 mg/dL (ref 70–99)

## 2019-08-30 LAB — MAGNESIUM: Magnesium: 2.9 mg/dL — ABNORMAL HIGH (ref 1.7–2.4)

## 2019-08-30 LAB — LACTIC ACID, PLASMA: Lactic Acid, Venous: 1.2 mmol/L (ref 0.5–1.9)

## 2019-08-30 LAB — HEMOGLOBIN A1C
Hgb A1c MFr Bld: 4.9 % (ref 4.8–5.6)
Mean Plasma Glucose: 93.93 mg/dL

## 2019-08-30 LAB — MRSA PCR SCREENING: MRSA by PCR: POSITIVE — AB

## 2019-08-30 LAB — CBG MONITORING, ED: Glucose-Capillary: 111 mg/dL — ABNORMAL HIGH (ref 70–99)

## 2019-08-30 LAB — VALPROIC ACID LEVEL: Valproic Acid Lvl: <10 ug/mL — ABNORMAL LOW (ref 50.0–100.0)

## 2019-08-30 MED ORDER — SODIUM CHLORIDE 0.9% FLUSH
10.0000 mL | Freq: Two times a day (BID) | INTRAVENOUS | Status: DC
  Administered 2019-08-30: 10 mL

## 2019-08-30 MED ORDER — FENTANYL CITRATE (PF) 100 MCG/2ML IJ SOLN: 25.0000 ug | INTRAMUSCULAR | Status: DC | PRN

## 2019-08-30 MED ORDER — INSULIN ASPART 100 UNIT/ML ~~LOC~~ SOLN: 0.0000 [IU] | SUBCUTANEOUS | Status: DC

## 2019-08-30 MED ORDER — SODIUM CHLORIDE 0.9 % IV SOLN
INTRAVENOUS | Status: AC | PRN
  Administered 2019-08-30: 500 mL/h via INTRAVENOUS

## 2019-08-30 MED ORDER — CHLORHEXIDINE GLUCONATE CLOTH 2 % EX PADS
6.0000 | MEDICATED_PAD | Freq: Every day | CUTANEOUS | Status: DC
  Administered 2019-08-30: 6 via TOPICAL

## 2019-08-30 MED ORDER — QUETIAPINE FUMARATE 25 MG PO TABS: 25.0000 mg | ORAL_TABLET | Freq: Two times a day (BID) | ORAL | Status: DC

## 2019-08-30 MED ORDER — ORAL CARE MOUTH RINSE
15.0000 mL | OROMUCOSAL | Status: DC
  Administered 2019-08-30 (×2): 15 mL via OROMUCOSAL

## 2019-08-30 MED ORDER — SODIUM CHLORIDE 0.9% FLUSH: 10.0000 mL | INTRAVENOUS | Status: DC | PRN

## 2019-08-30 MED ORDER — OLANZAPINE 2.5 MG PO TABS: 2.5000 mg | ORAL_TABLET | Freq: Every day | ORAL | Status: DC

## 2019-08-30 MED ORDER — MUPIROCIN 2 % EX OINT
1.0000 "application " | TOPICAL_OINTMENT | Freq: Two times a day (BID) | CUTANEOUS | Status: DC
  Administered 2019-08-30: 1 via NASAL

## 2019-08-30 MED ORDER — CHLORHEXIDINE GLUCONATE 0.12% ORAL RINSE (MEDLINE KIT)
15.0000 mL | Freq: Two times a day (BID) | OROMUCOSAL | Status: DC
  Administered 2019-08-30: 15 mL via OROMUCOSAL

## 2019-08-30 MED ORDER — CLONAZEPAM 0.5 MG PO TABS
0.5000 mg | ORAL_TABLET | Freq: Three times a day (TID) | ORAL | Status: DC
  Administered 2019-08-30 (×2): 0.5 mg
  Filled 2019-08-30 (×2): qty 1

## 2019-08-30 MED ORDER — SODIUM CHLORIDE 0.9 % IV SOLN
2.0000 g | Freq: Two times a day (BID) | INTRAVENOUS | Status: DC
  Administered 2019-08-30: 2 g via INTRAVENOUS
  Filled 2019-08-30 (×5): qty 2

## 2019-09-04 LAB — CULTURE, BLOOD (ROUTINE X 2)
Culture: NO GROWTH
Culture: NO GROWTH

## 2019-09-07 NOTE — ED Notes (Signed)
[employer] interrogation completed: Per rep, pt had a VF episode on 09/20/19 at 1922 lasting 21 seconds, rate 182-259bpm, received 30J shock with rate to 120bpm. Pt has had multiple 10 beat runs of Vtach (13 episodes). Battery and all wiring seems to be working correctly

## 2019-09-07 NOTE — Progress Notes (Signed)
eLink Physician-Brief Progress Note Patient Name: Henry Mcbride DOB: Jun 13, 1945 MRN: [id number]   Date of Service  20-Sep-2019  HPI/Events of Note  Patient admitted with out of hospital cardiac arrest at [facility], where he has resided since he was hospitalized at [facility] for severe Covid pneumonia with acute hypoxemic respiratory failure. Patient has a tracheostomy from that hospitalization.  eICU Interventions  New Patient Evaluation completed.        Thomasene Lot Keevin Panebianco 2019/09/20, 4:54 AM

## 2019-09-07 NOTE — Death Summary Note (Signed)
DEATH SUMMARY   Patient Details  Name: Henry Mcbride MRN: [id number] DOB: Apr 22, 1945  Admission/Discharge Information   Admit Date:  09-15-19  Date of Death: Date of Death: 09/16/19  Time of Death: Time of Death: Jun 04, 1115  Length of Stay: 1  Referring Physician: Galvin Proffer, MD   Reason(s) for Hospitalization  Cardiac arrest  Diagnoses  Preliminary cause of death: acute cor pulmonale from respiratory  Failure.  Secondary Diagnoses (including complications and co-morbidities):  Active Problems:   Healthcare-associated pneumonia   Cardiac arrest (HCC)   Pressure injury of skin   Brief Hospital Course (including significant findings, care, treatment, and services provided and events leading to death)  Henry Mcbride is a 74 y.o. year old male who is ventilator dependent with trach/PEG admitted from [facility] after witnessed cardiac arrest, PEA with 15 minutes of CPR prior to shock 5 arrived and additional 6 minutes with paramedics. Found to have severe respiratory failure due to presume aspiration event. Worsening hypoxia after admission with sudden decline with hypotension and transition to asystole. Suspect acute RV failure from severe respiratory failure. Patient had an established DNR so CPR was not initiated.     Pertinent Labs and Studies  Significant Diagnostic Studies CT Head Wo Contrast  Result Date: 09-15-2019 CLINICAL DATA:  Mental status change EXAM: CT HEAD WITHOUT CONTRAST TECHNIQUE: Contiguous axial images were obtained from the base of the skull through the vertex without intravenous contrast. COMPARISON:  CT 05/16/2019 FINDINGS: Brain: No acute territorial infarction, hemorrhage, or intracranial mass. Moderate atrophy. Mild hypodensity in the white matter consistent with chronic small vessel ischemic change. Stable ventricle size Vascular: No hyperdense vessels.  Carotid vascular calcification. Skull: No fracture. Sinuses/Orbits: Mucous retention cyst in the  left sphenoid sinus. Interval diffuse opacification and fluid in the right mastoid air cells. Other: Incompletely visualized posterior rods and fixating screws at the upper cervical spine. IMPRESSION: 1. No CT evidence for acute intracranial abnormality. Atrophy and chronic small vessel ischemic change of the white matter 2. New opacification and fluid in the right mastoid air cells. Electronically Signed   By: Jasmine Pang M.D.   On: 09/15/19 21:53   CT CHEST W CONTRAST  Result Date: 08/12/2019 CLINICAL DATA:  Respiratory failure. EXAM: CT CHEST WITH CONTRAST TECHNIQUE: Multidetector CT imaging of the chest was performed during intravenous contrast administration. CONTRAST:  56mL OMNIPAQUE IOHEXOL 300 MG/ML  SOLN COMPARISON:  Jul 07, 2019. FINDINGS: Cardiovascular: No evidence of thoracic aortic dissection or aneurysm. Mild cardiomegaly is noted. No pericardial effusion is noted. Mediastinum/Nodes: Tracheostomy tube is in good position esophagus is unremarkable. No adenopathy is noted. Thyroid gland is unremarkable. Lungs/Pleura: No pneumothorax is noted. Large bilateral pleural effusions are noted. Minimal subsegmental atelectasis of left lower lobe is noted. Mild to moderate atelectasis of right lower lobe is noted. Upper Abdomen: Gastrostomy tube is in good position. No acute abnormality is noted. Musculoskeletal: Minimally displaced fracture is seen involving the anterior portion of a left lower rib. IMPRESSION: 1. Large bilateral pleural effusions are noted with minimal subsegmental atelectasis of left lower lobe. 2. Mild to moderate atelectasis of right lower lobe is noted. 3. Minimally displaced fracture is seen involving the anterior portion of a left lower rib. Electronically Signed   By: Lupita Raider M.D.   On: 08/12/2019 13:54   DG Chest Port 1 View  Result Date: 09/15/2019 CLINICAL DATA:  Respiratory failure EXAM: PORTABLE CHEST 1 VIEW COMPARISON:  08/13/2019 FINDINGS: New tracheostomy  appliance noted  with probable retaining balloon expanding the proximal airway. Interval development of extensive infiltrate throughout the right lung and mild left perihilar interstitial infiltrate, likely infectious in the acute setting. Small right pleural effusion is suspected. Small left pleural effusion has enlarged in the interval. No pneumothorax. Left subclavian pacemaker defibrillator is unchanged. Mild to moderate cardiomegaly is stable. IMPRESSION: Interval development of extensive infiltrate asymmetrically throughout the right lung suspicious for and asymmetric pneumonic infiltrate. Interval development of small right and enlargement of small left pleural effusions. Tracheostomy appliance retaining balloon expands the proximal airway. Electronically Signed   By: Helyn Numbers MD   On: 09-12-2019 21:32   DG Chest Port 1 View  Result Date: 08/13/2019 CLINICAL DATA:  74 year old male status post ultrasound-guided right side thoracentesis this afternoon. EXAM: PORTABLE CHEST 1 VIEW COMPARISON:  Chest CT yesterday. Portable chest 08/09/2019 and earlier. FINDINGS: Portable AP semi upright view at 1305 hours. Decreased veiling opacity in the right lung with improved ventilation. No pneumothorax identified. Stable cardiac size and mediastinal contours. Stable tracheostomy and left chest AICD. Stable dense retrocardiac opacity which seems to mostly correspond to layering left pleural effusion as seen by CT yesterday. Superimposed upper lobe atelectasis suspected. No areas of worsening ventilation compared to 08/09/2019. IMPRESSION: 1. Improved right lung ventilation and no pneumothorax following thoracentesis. 2. Stable left pleural effusion with atelectasis. Electronically Signed   By: Odessa Fleming M.D.   On: 08/13/2019 13:35   DG CHEST PORT 1 VIEW  Result Date: 08/09/2019 CLINICAL DATA:  74 year old male with a history of respiratory failure EXAM: PORTABLE CHEST 1 VIEW COMPARISON:  07/31/2019, 07/29/2019  FINDINGS: Cardiomediastinal silhouette unchanged in size and contour. Right heart border again obscured by overlying lung/pleural disease. The upper left heart border also obscured by lung/pleural disease, unchanged. Opacity at the right lung base obscuring the right hemidiaphragm, unchanged. Blunting of the left costophrenic angle. Tracheostomy tube is unchanged. Cardiac pacing device/AICD, unchanged. No pneumothorax. IMPRESSION: Unchanged appearance of the chest x-ray compared to 07/31/2019, with bilateral right greater than left mixed interstitial and airspace opacities, and bilateral basilar opacities, with the differential diagnosis including a combination of pleural fluid, edema, multifocal pneumonia, and/or chronic changes/atelectasis. Unchanged tracheostomy and left cardiac AICD. Electronically Signed   By: Gilmer Mor D.O.   On: 08/09/2019 14:22   IR THORACENTESIS ASP PLEURAL SPACE W/IMG GUIDE  Result Date: 08/13/2019 INDICATION: Respiratory failure with tracheostomy and ventilator dependence. Right pleural effusion. Request for therapeutic thoracentesis. EXAM: ULTRASOUND GUIDED RIGHT THORACENTESIS MEDICATIONS: 1% lidocaine 15 mL COMPLICATIONS: None immediate. PROCEDURE: An ultrasound guided thoracentesis was thoroughly discussed with the patient and questions answered. The benefits, risks, alternatives and complications were also discussed. The patient understands and wishes to proceed with the procedure. Written consent was obtained. Ultrasound was performed to localize and mark an adequate pocket of fluid in the right chest. The area was then prepped and draped in the normal sterile fashion. 1% Lidocaine was used for local anesthesia. Under ultrasound guidance a 6 Fr Safe-T-Centesis catheter was introduced. Thoracentesis was performed. The catheter was removed and a dressing applied. FINDINGS: A total of approximately 1.2 L of clear amber fluid was removed. IMPRESSION: Successful ultrasound guided  right thoracentesis yielding 1.2 L of pleural fluid. Read by: Corrin Parker, PA-C Electronically Signed   By: Irish Lack M.D.   On: 08/13/2019 12:53    Microbiology Recent Results (from the past 240 hour(s))  Culture, blood (routine x 2)     Status: None   Collection Time:  08/31/2019  9:04 PM   Specimen: BLOOD  Result Value Ref Range Status   Specimen Description BLOOD PICC LINE  Final   Special Requests   Final    BOTTLES DRAWN AEROBIC AND ANAEROBIC Blood Culture results may not be optimal due to an inadequate volume of blood received in culture bottles   Culture   Final    NO GROWTH 6 DAYS Performed at [facility], [address] [place], [place] [address]    Report Status 09/04/2019 FINAL  Final  SARS Coronavirus 2 by RT PCR (hospital order, performed in [facility] hospital lab) Nasopharyngeal Nasopharyngeal Swab     Status: None   Collection Time: 08/18/2019  9:06 PM   Specimen: Nasopharyngeal Swab  Result Value Ref Range Status   SARS Coronavirus 2 NEGATIVE NEGATIVE Final    Comment: (NOTE) SARS-CoV-2 target nucleic acids are NOT DETECTED.  The SARS-CoV-2 RNA is generally detectable in upper and lower respiratory specimens during the acute phase of infection. The lowest concentration of SARS-CoV-2 viral copies this assay can detect is 250 copies / mL. A negative result does not preclude SARS-CoV-2 infection and should not be used as the sole basis for treatment or other patient management decisions.  A negative result may occur with improper specimen collection / handling, submission of specimen other than nasopharyngeal swab, presence of viral mutation(s) within the areas targeted by this assay, and inadequate number of viral copies (<250 copies / mL). A negative result must be combined with clinical observations, patient history, and epidemiological information.  Fact Sheet for Patients:   BoilerBrush.com.cyhttps://www.fda.gov/media/136312/download  Fact Sheet for Healthcare  Providers: https://pope.com/https://www.fda.gov/media/136313/download  This test is not yet approved or  cleared by the [place] FDA and has been authorized for detection and/or diagnosis of SARS-CoV-2 by FDA under an Emergency Use Authorization (EUA).  This EUA will remain in effect (meaning this test can be used) for the duration of the COVID-19 declaration under Section 564(b)(1) of the Act, 21 U.S.C. section 360bbb-3(b)(1), unless the authorization is terminated or revoked sooner.  Performed at [facility], [address] [place], [place] [address]   Culture, blood (routine x 2)     Status: None   Collection Time: 08/14/2019  9:15 PM   Specimen: BLOOD RIGHT FOREARM  Result Value Ref Range Status   Specimen Description BLOOD RIGHT FOREARM  Final   Special Requests   Final    BOTTLES DRAWN AEROBIC AND ANAEROBIC Blood Culture results may not be optimal due to an inadequate volume of blood received in culture bottles   Culture   Final    NO GROWTH 6 DAYS Performed at [facility], [address] [place], [place] [address]    Report Status 09/04/2019 FINAL  Final  MRSA PCR Screening     Status: Abnormal   Collection Time: 10-02-19  3:20 AM   Specimen: Nasal Mucosa; Nasopharyngeal  Result Value Ref Range Status   MRSA by PCR POSITIVE (A) NEGATIVE Final    Comment:        The GeneXpert MRSA Assay (FDA approved for NASAL specimens only), is one component of a comprehensive MRSA colonization surveillance program. It is not intended to diagnose MRSA infection nor to guide or monitor treatment for MRSA infections. RESULT CALLED TO, READ BACK BY AND VERIFIED WITH: A CURRIN RN 2020/01/04 0504 JDW Performed at [facility], [address] [place], [place] [address]     Lab Basic Metabolic  Panel: No results for input(s): NA, K, CL, CO2, GLUCOSE, BUN, CREATININE, CALCIUM, MG, PHOS in the last 168 hours. Liver Function Tests: No results for input(s): AST, ALT, ALKPHOS,  BILITOT, PROT, ALBUMIN in the last 168 hours. No results for input(s): LIPASE, AMYLASE in the last 168 hours. No results for input(s): AMMONIA in the last 168 hours. CBC: No results for input(s): WBC, NEUTROABS, HGB, HCT, MCV, PLT in the last 168 hours. Cardiac Enzymes: No results for input(s): CKTOTAL, CKMB, CKMBINDEX, TROPONINI in the last 168 hours. Sepsis Labs: No results for input(s): PROCALCITON, WBC, LATICACIDVEN in the last 168 hours.  Procedures/Operations  Mechanical ventilation.    Arne Schlender 09/06/2019, 3:57 PM

## 2019-09-07 NOTE — Progress Notes (Signed)
eLink Physician-Brief Progress Note Patient Name: Henry Mcbride DOB: 1945-12-29 MRN: [id number]   Date of Service  09-24-19  HPI/Events of Note  PCO2 79.2, PH 7.24, Troponin 171  eICU Interventions  RR increased to 33, Ventilator mode changed to pressure control which is his mode at the facility, Troponin ordered x 2 since it is not clear that it has peaked yet.        [name] 09/24/2019, 5:54 AM

## 2019-09-07 NOTE — Progress Notes (Signed)
Pharmacy Antibiotic Note  Henry Mcbride is a 74 y.o. male admitted on 09/06/2019 post CPR, concern for pna.  Pharmacy has been consulted for vancomycin and zosyn dosing.  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  Plan to start meropenem  Plan: Meropenem 2gm IV q12h F/u renal function, cultures and clinical course    Temp (24hrs), Avg:92.3 F (33.5 C), Min:92.3 F (33.5 C), Max:92.3 F (33.5 C)  Recent Labs  Lab 2019/09/06 2104 09-06-19 2123  WBC 9.5  --   CREATININE 1.32* 1.40*  LATICACIDVEN 3.3*  --     CrCl cannot be calculated (Unknown ideal weight.).    Allergies  Allergen Reactions  . Lithium Diarrhea and Other (See Comments)    Frequency in urination  Frequent urination    Antimicrobials this admission: Vanc 7/23>> Zosyn 7/23>>7/23 Meropenem 7/24>>  Dose adjustments this admission: n/a  Microbiology results: 7/23 BCx: sent  Thanks for allowing pharmacy to be a part of this patient's care.  Talbert Cage, PharmD Clinical Pharmacist 08/20/2019 12:20 AM

## 2019-09-07 DEATH — deceased

## 2022-03-01 IMAGING — DX DG ABDOMEN 1V
1 series · 2 of 2 positions shown · non-contrast
Comparison: None.

CLINICAL DATA: Check gastrostomy placement

EXAM:
ABDOMEN - 1 VIEW

[Series 1: abdomen · 0.14mm/px · 2 of 2 slices shown]
[im 1/2]
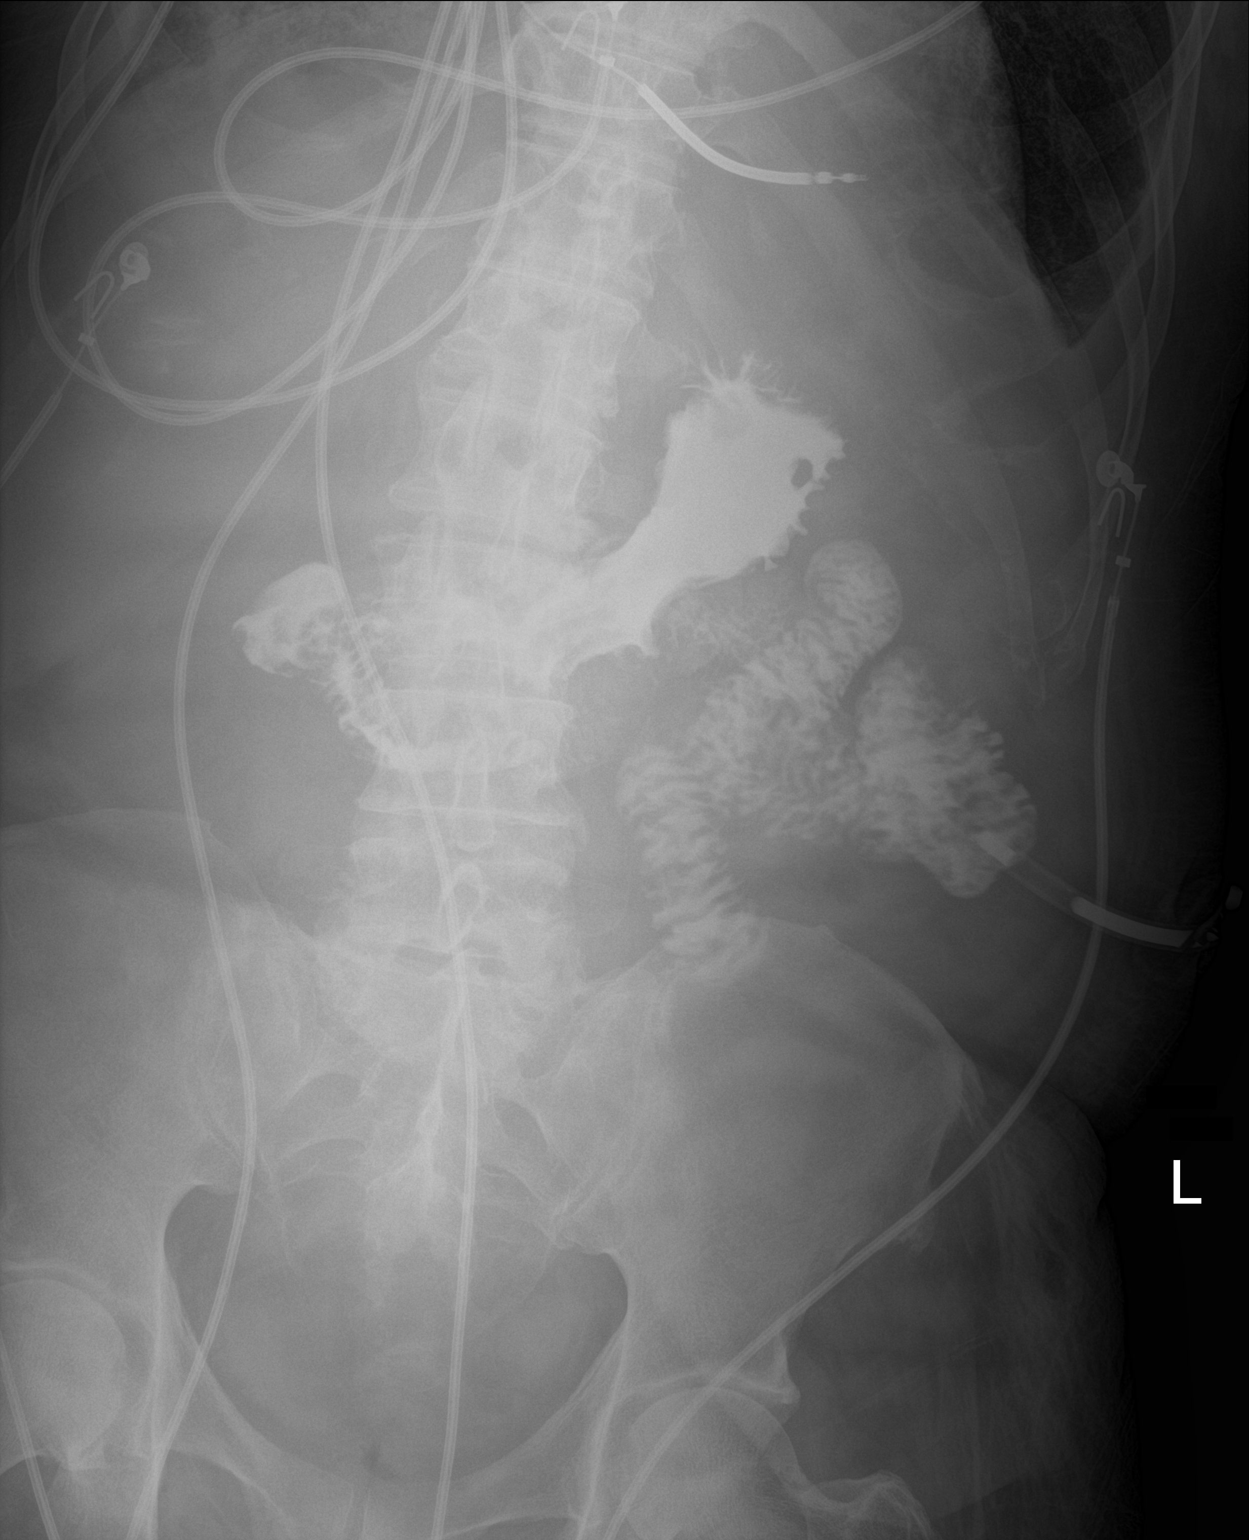
[im 2/2]
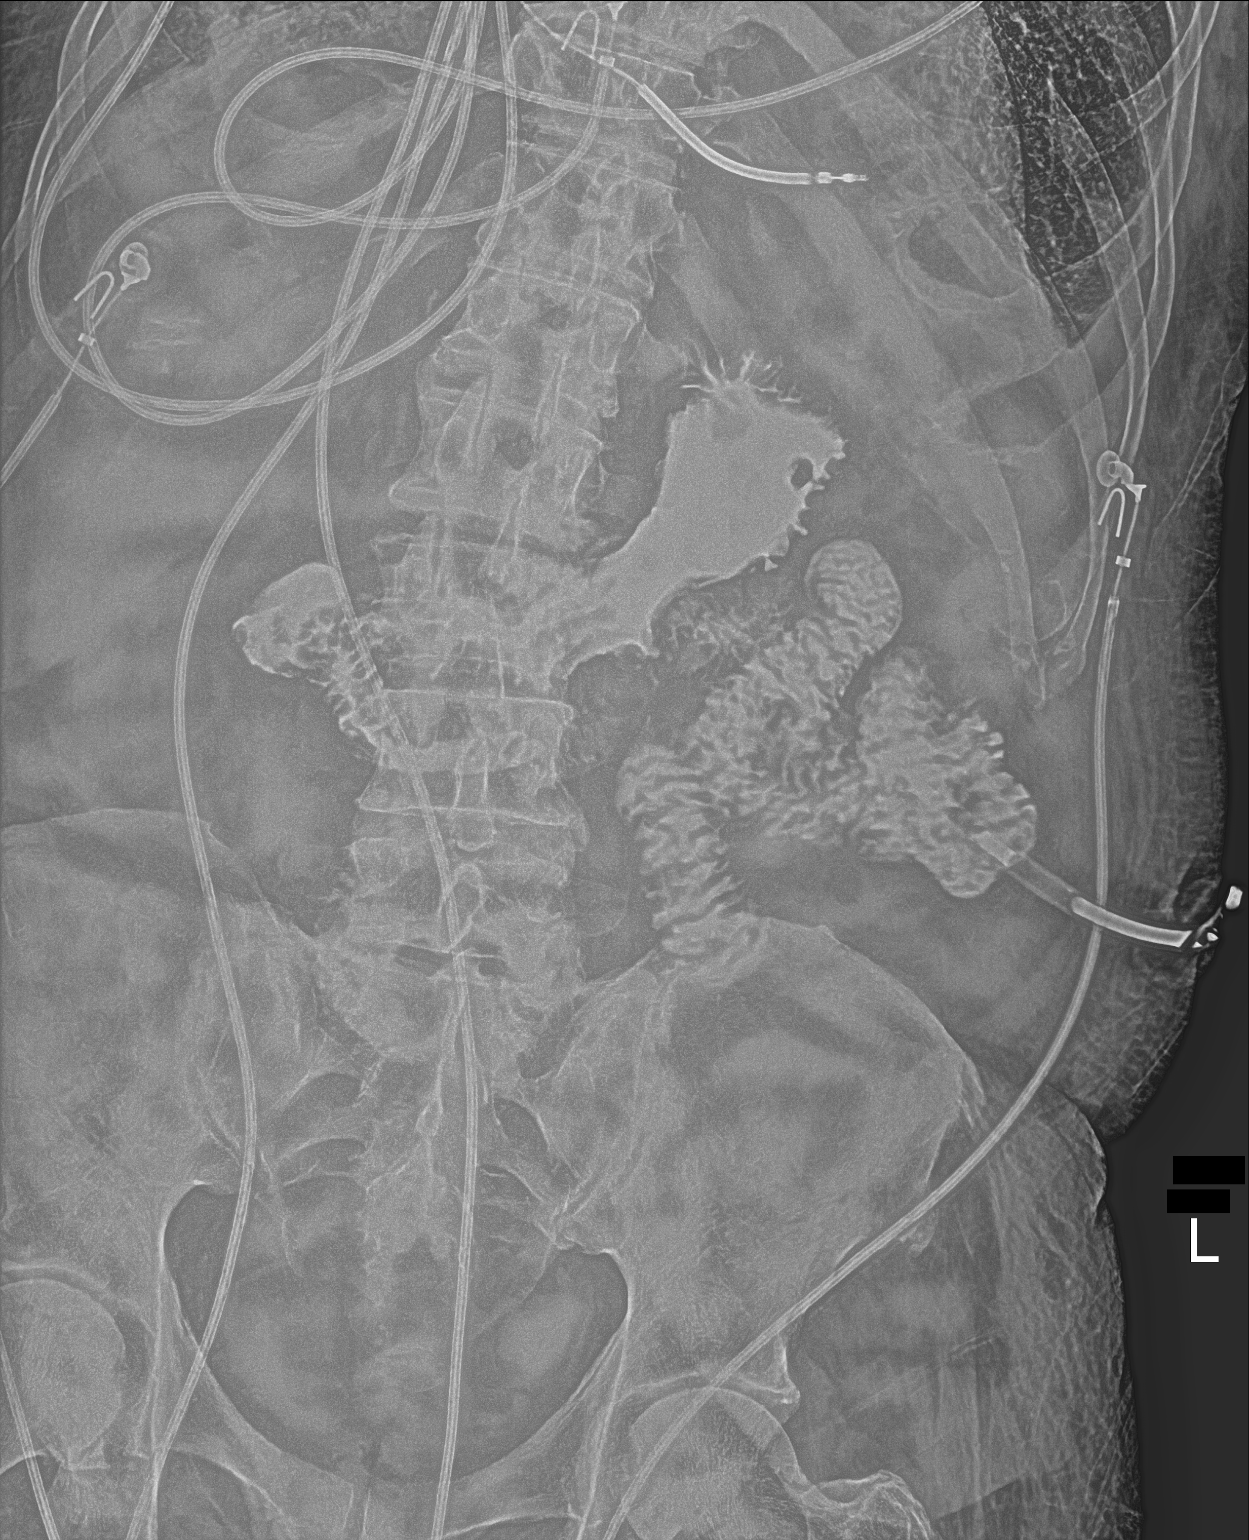

[2 of 2 positions shown; findings below may reference images not displayed]

FINDINGS: Gastrostomy catheter was injected with contrast material. Contrast
flows freely into the stomach and subsequently the small bowel.
IMPRESSION: Gastrostomy catheter within the stomach.

## 2022-03-15 IMAGING — DX DG CHEST 1V PORT
1 series · 1 of 1 positions shown · non-contrast
Comparison: 06/23/2019 chest radiograph.

CLINICAL DATA: Respiratory failure

EXAM:
PORTABLE CHEST 1 VIEW

[chest ap]
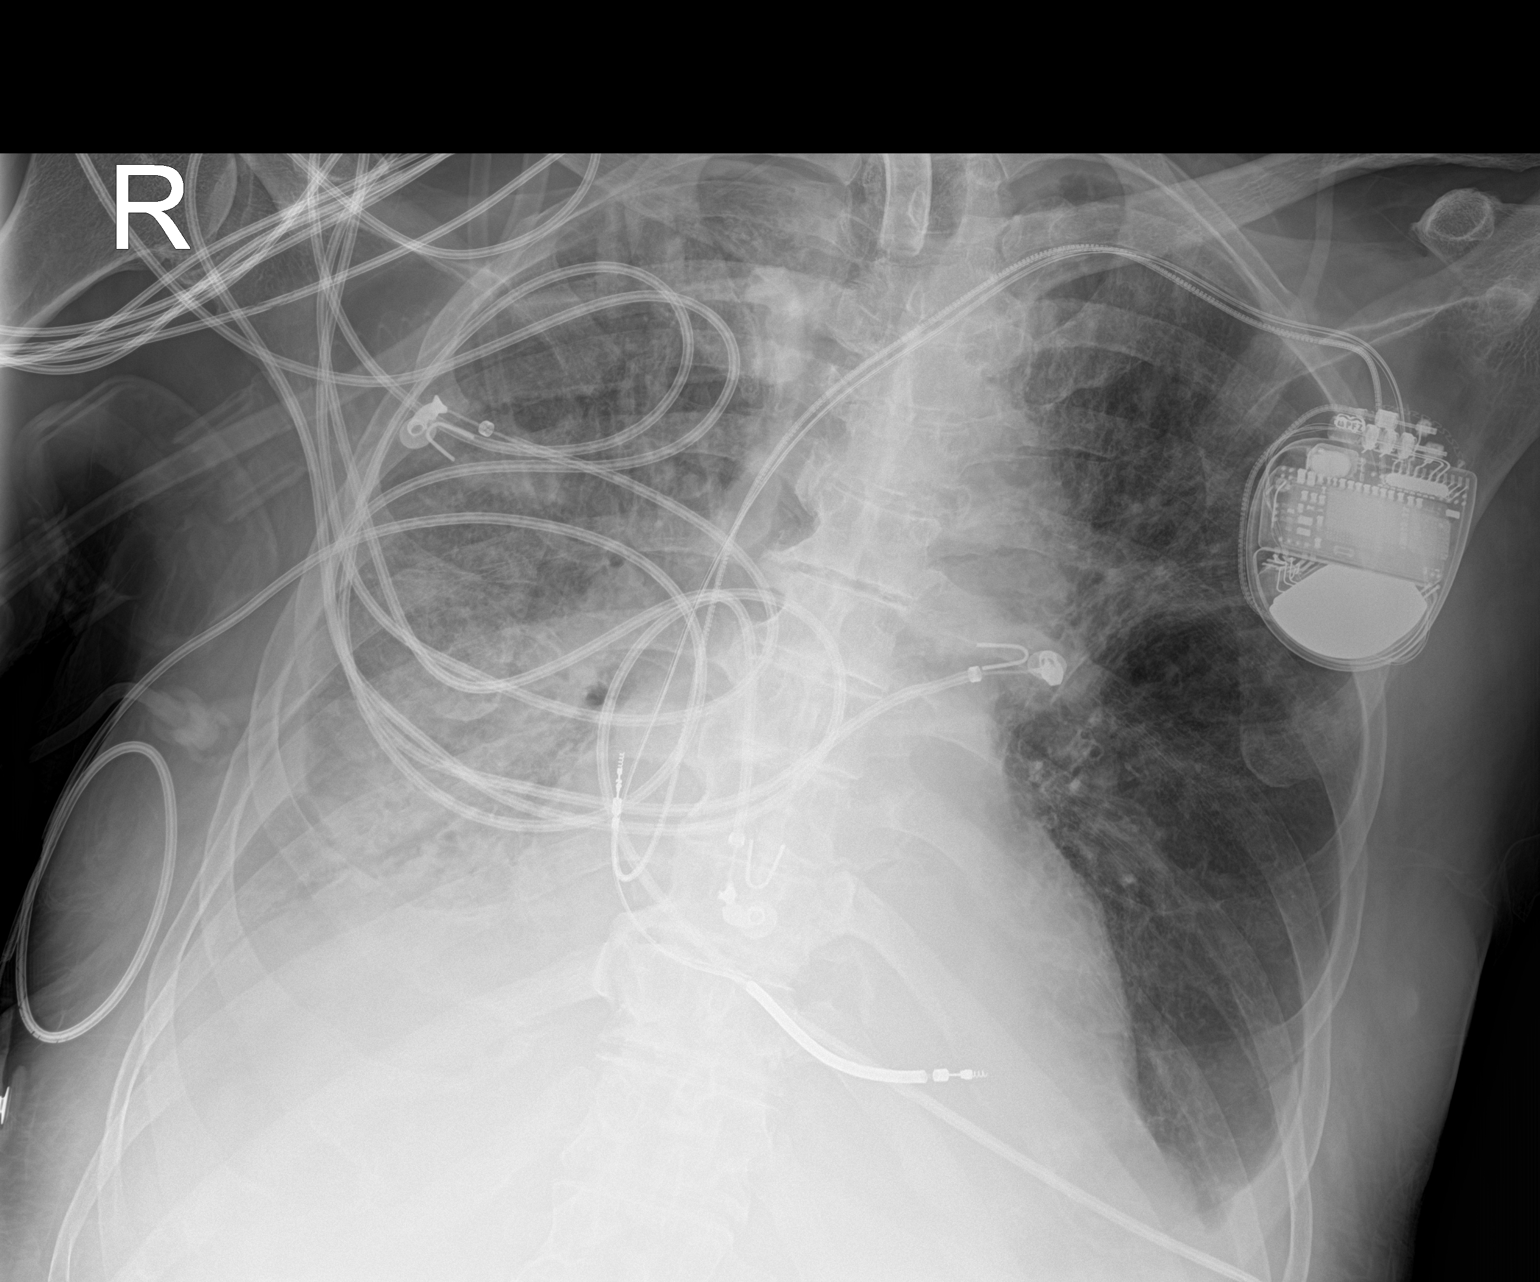

[1 of 1 positions shown; findings below may reference images not displayed]

FINDINGS: Tracheostomy tube tip overlies the tracheal air column at the
thoracic inlet. Stable configuration of 2 lead left subclavian ICD.
Stable cardiomediastinal silhouette with mild cardiomegaly. No
pneumothorax. Stable small bilateral pleural effusions. Patchy
opacities throughout the lungs bilaterally, right greater than left,
with a component of coarsened opacities with parenchymal distortion,
with air bronchograms at the right lung base, mildly worsened.
IMPRESSION: 1. Patchy coarsened bilateral lung opacities, right greater than
left, with air bronchograms at the right lung base, favor pneumonia
superimposed on chronic scarring, with a component pulmonary edema
not excluded.
2. Stable small bilateral pleural effusions.
3. Stable mild cardiomegaly.

## 2022-03-17 IMAGING — US IR THORACENTESIS ASP PLEURAL SPACE W/IMG GUIDE
1 series · 3 of 3 positions shown · non-contrast
Comparison: none

INDICATION: Patient with history of [id number] infection, multifocal pneumonia,
sepsis, now with bilateral pleural effusions. Request is made for
therapeutic thoracentesis.

[Series 1: ir (id) (id)/(id)/(id) ir · 3 of 3 slices shown]
[im 1/3]
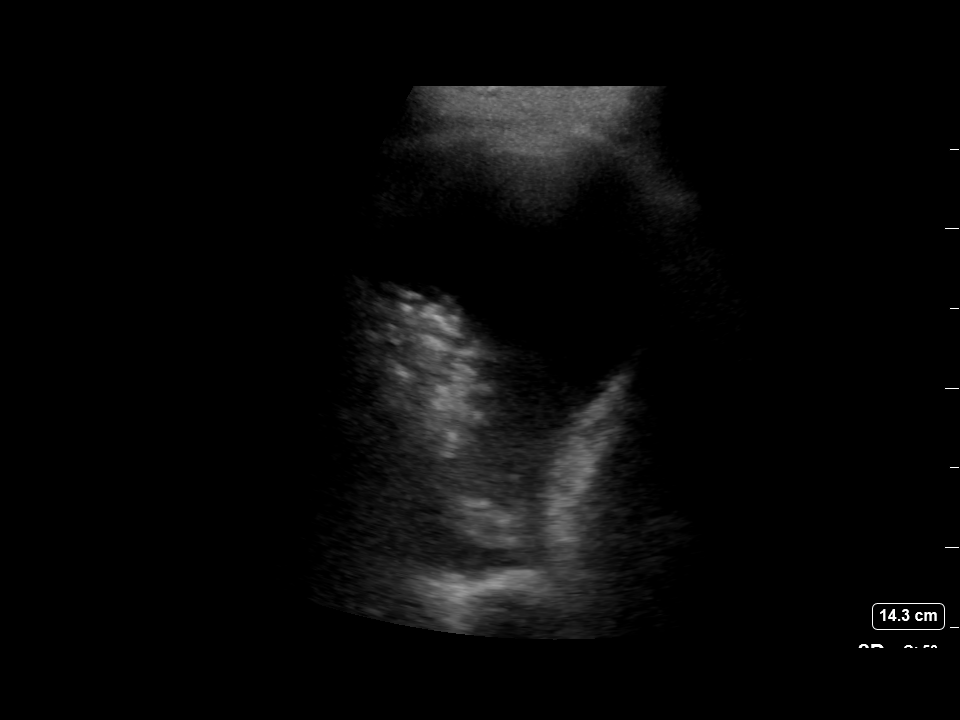
[im 2/3]
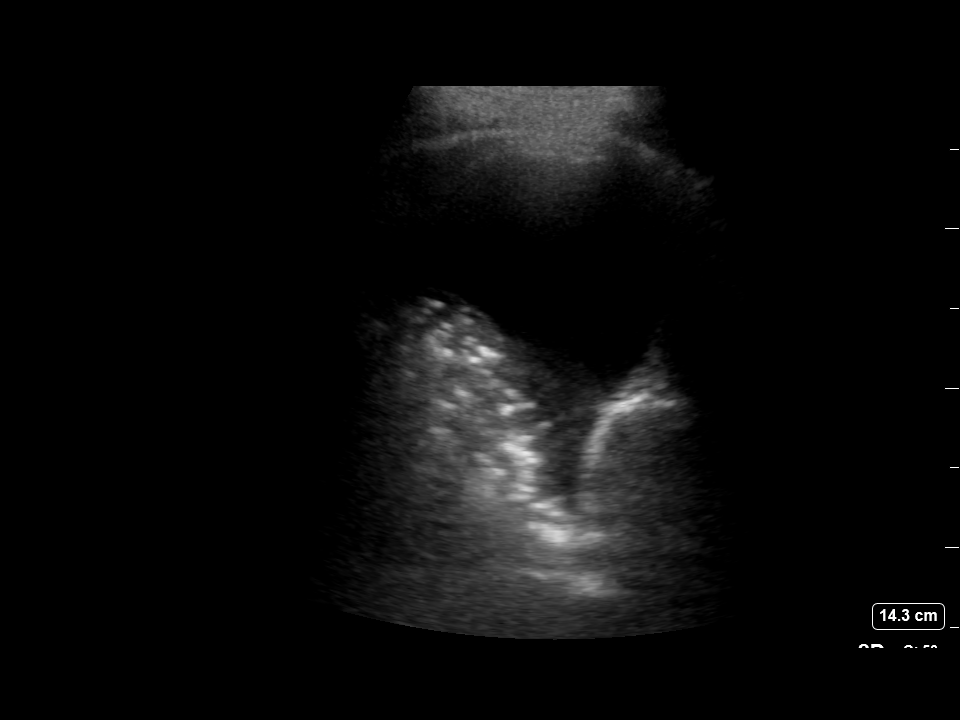
[im 3/3]
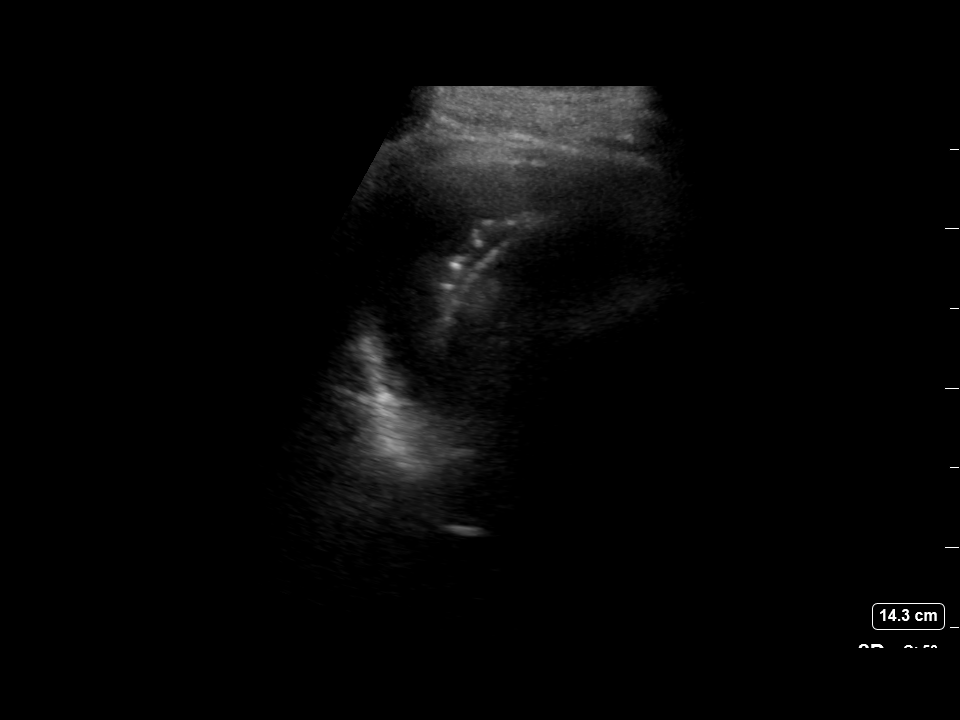

[3 of 3 positions shown; findings below may reference images not displayed]

EXAM:
ULTRASOUND GUIDED THERAPEUTIC RIGHT THORACENTESIS

MEDICATIONS:
10 mL 1% lidocaine

COMPLICATIONS:
None immediate.

PROCEDURE:
An ultrasound guided thoracentesis was thoroughly discussed with the
patient and questions answered. The benefits, risks, alternatives
and complications were also discussed. The patient understands and
wishes to proceed with the procedure. Written consent was obtained.

Ultrasound was performed to localize and mark an adequate pocket of
fluid in the right chest. The area was then prepped and draped in
the normal sterile fashion. 1% Lidocaine was used for local
anesthesia. Under ultrasound guidance a 6 Fr Safe-T-Centesis
catheter was introduced. Thoracentesis was performed. The catheter
was removed and a dressing applied.
FINDINGS: A total of approximately 700 mL of bloody, amber fluid was removed.
IMPRESSION: Successful ultrasound guided therapeutic right thoracentesis
yielding 700 mL of pleural fluid.

## 2022-03-17 IMAGING — DX DG CHEST 1V PORT
1 series · 1 of 1 positions shown · non-contrast
Comparison: Chest radiograph and CT dated 07/07/2019.

CLINICAL DATA: 74-year-old male status post thoracentesis.

EXAM:
PORTABLE CHEST 1 VIEW

[chest]
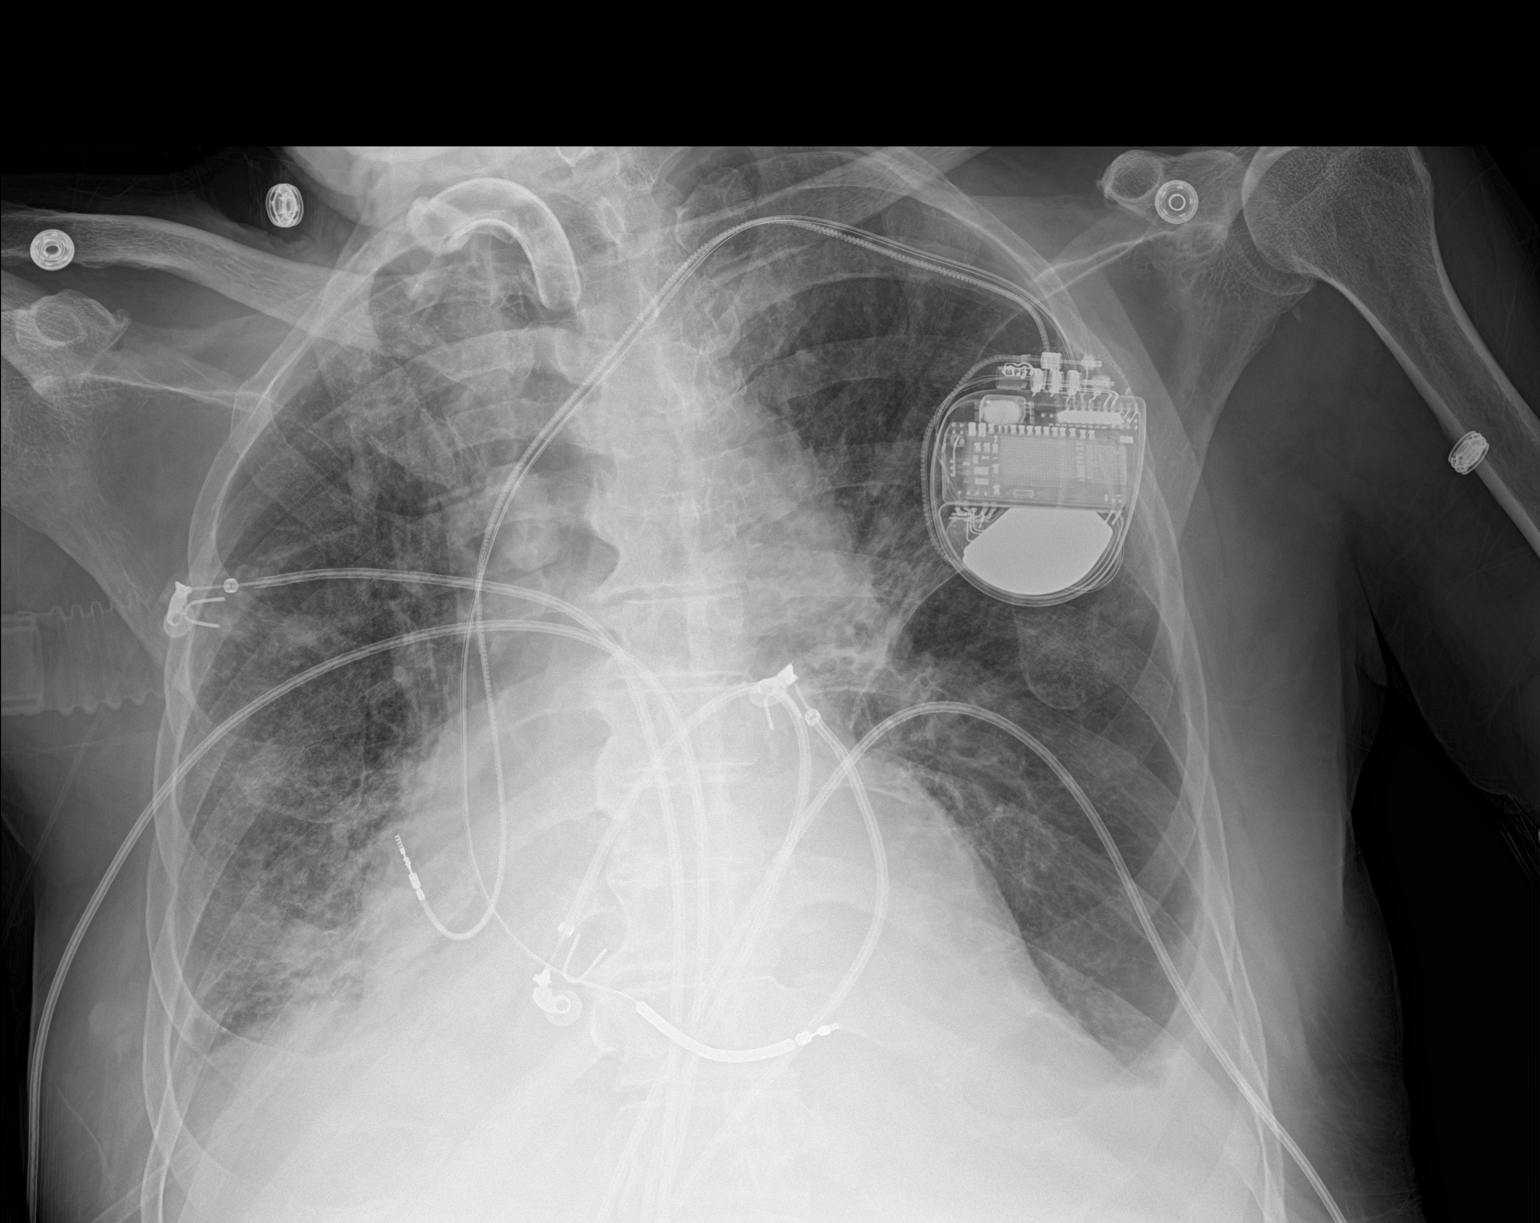

[1 of 1 positions shown; findings below may reference images not displayed]

FINDINGS: Interval decrease in the size of the right pleural effusion with
improved aeration of the right lung compared to the prior
radiograph. Small left pleural effusion is noted. No obvious
pneumothorax. Stable cardiomediastinal silhouette. Tracheostomy
above the carina. Left pectoral pacemaker device. No acute osseous
pathology.
IMPRESSION: Interval decrease in the size of the right pleural effusion with
improved aeration of the right lung status post thoracentesis. No
pneumothorax

## 2022-04-03 IMAGING — CT CT ABD-PELV W/O CM
2 of 4 series · 16 of 46 positions shown, 18 images · non-contrast
Comparison: Radiograph yesterday.  CT 05/16/2019

CLINICAL DATA: Abdominal distension.  Bowel obstruction suspected.

EXAM:
CT ABDOMEN AND PELVIS WITHOUT CONTRAST
TECHNIQUE: Multidetector CT imaging of the abdomen and pelvis was performed
following the standard protocol without IV contrast.

[Series 3: a/p w/o 5mm · axial · non-contrast · 0.87mm/px · z∈[+1408,+1888]mm · 13 of 106 slices shown, 15 images]
[im 5/106  soft-tissue]
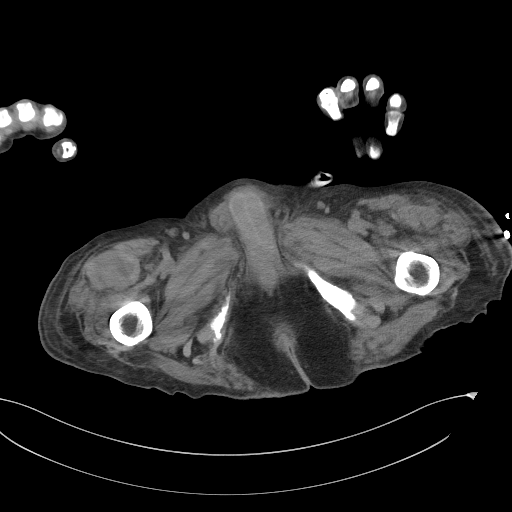
[im 5/106  bone]
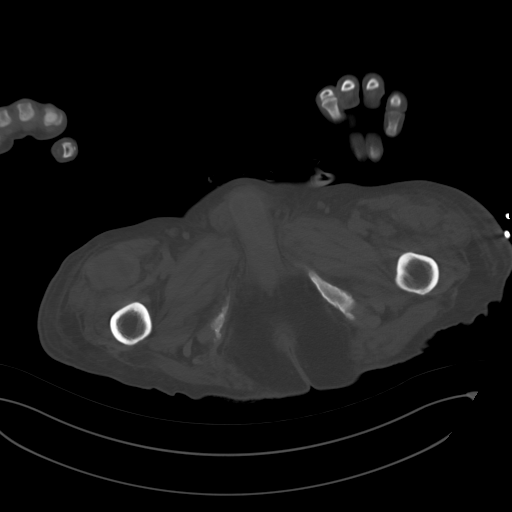
[im 14/106  soft-tissue]
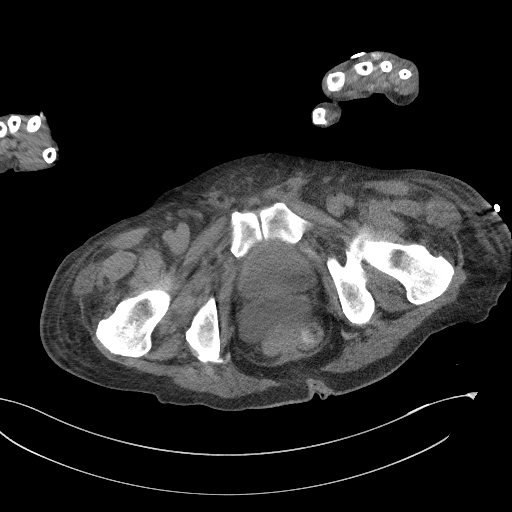
[im 23/106  soft-tissue]
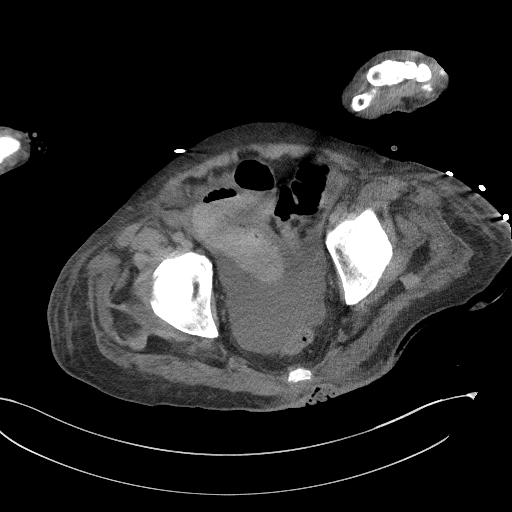
[im 28/106  soft-tissue]
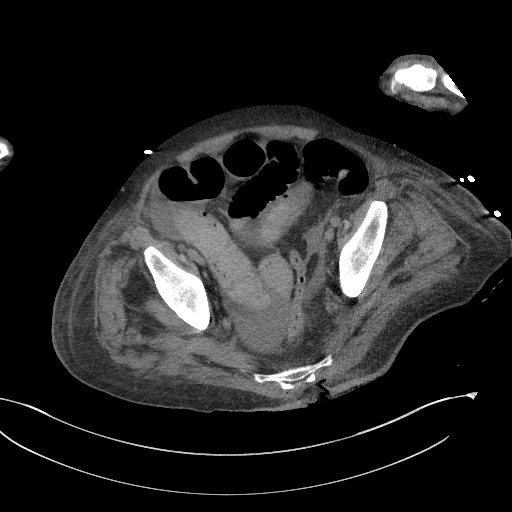
[im 37/106  soft-tissue]
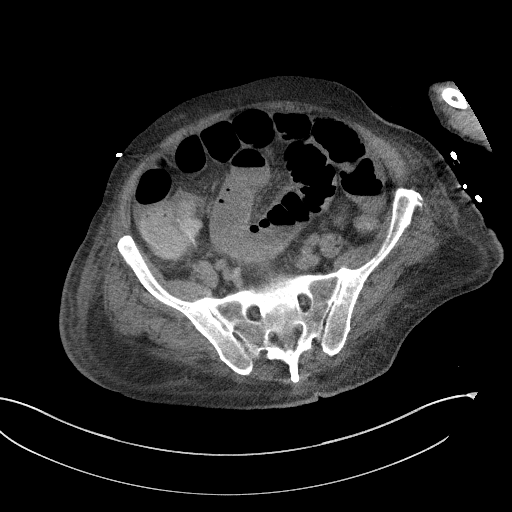
[im 46/106  soft-tissue]
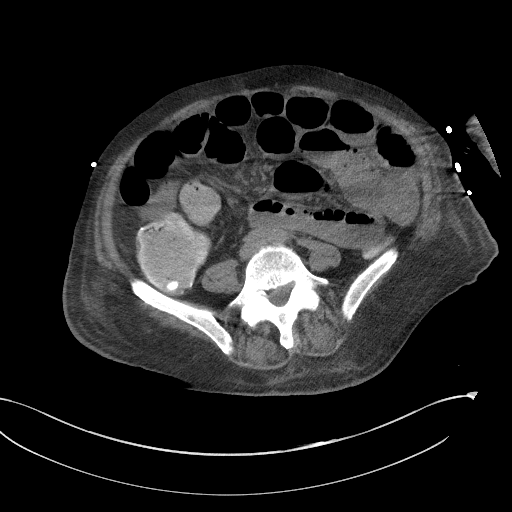
[im 55/106  soft-tissue]
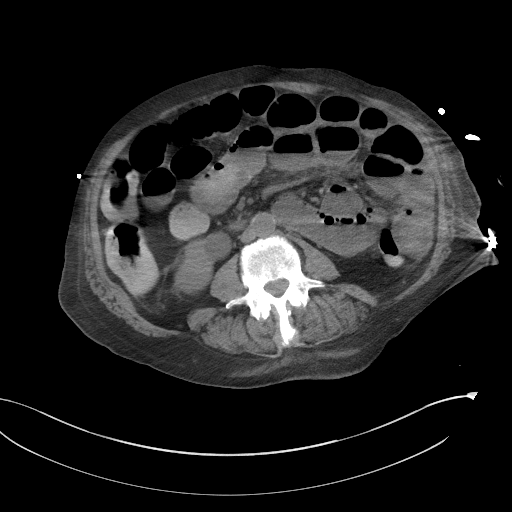
[im 60/106  soft-tissue]
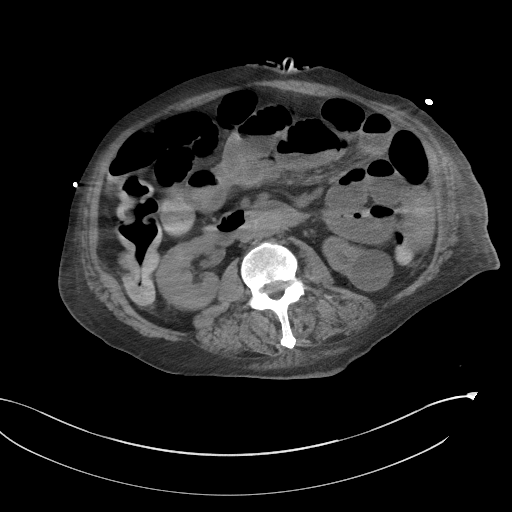
[im 69/106  soft-tissue]
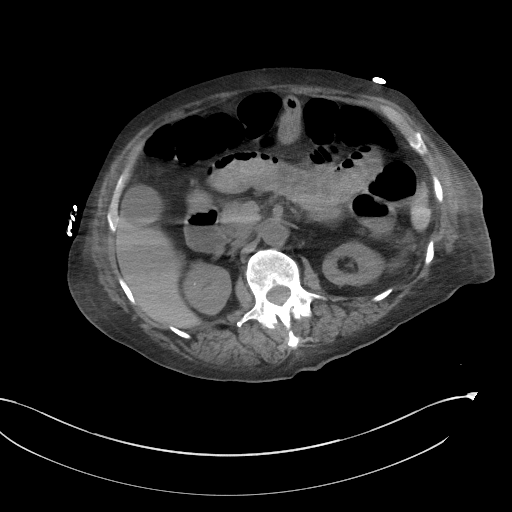
[im 69/106  bone]
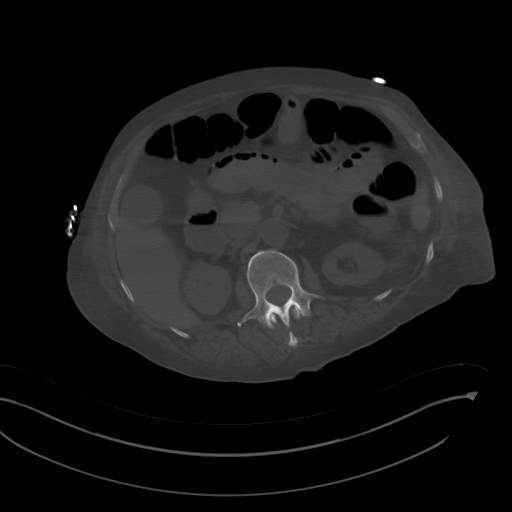
[im 78/106  soft-tissue]
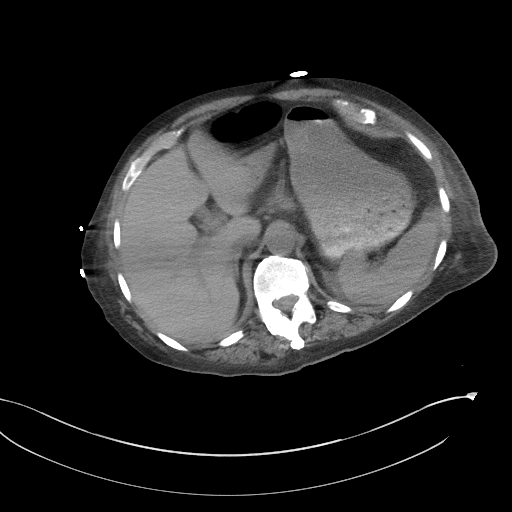
[im 83/106  soft-tissue]
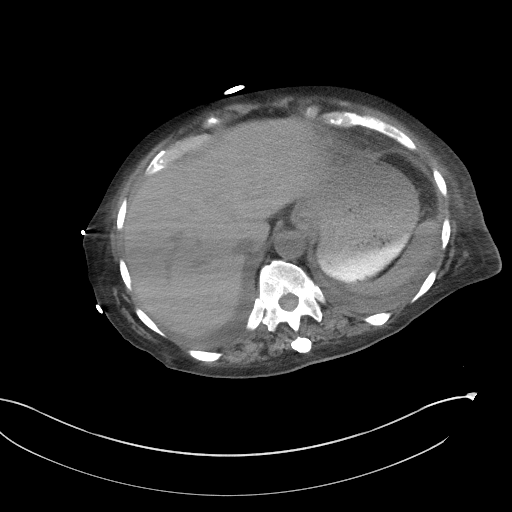
[im 92/106  soft-tissue]
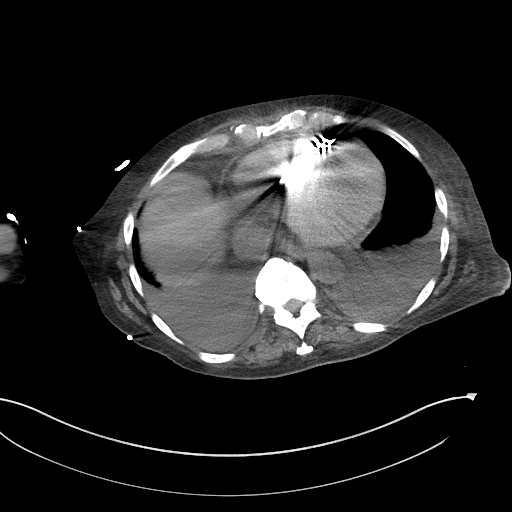
[im 101/106  soft-tissue]
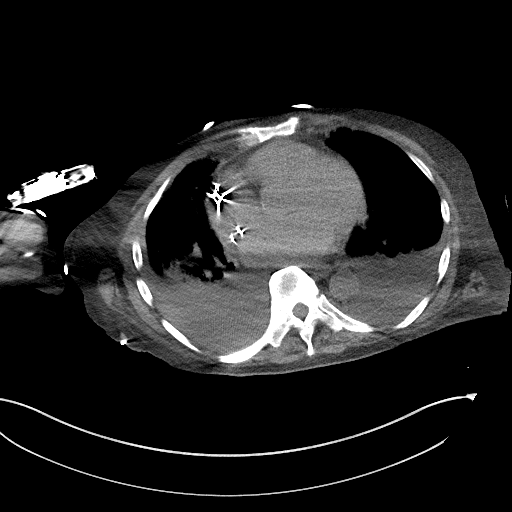

[Series 6: a/p w/o cor · coronal · non-contrast · 1.02mm/px · 3 of 166 slices shown]
[im 56/166  soft-tissue]
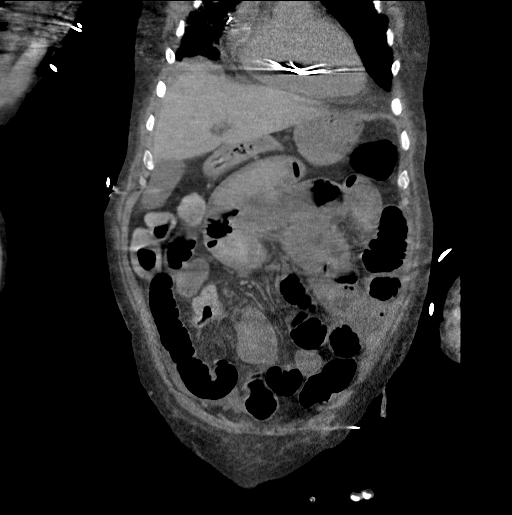
[im 74/166  soft-tissue]
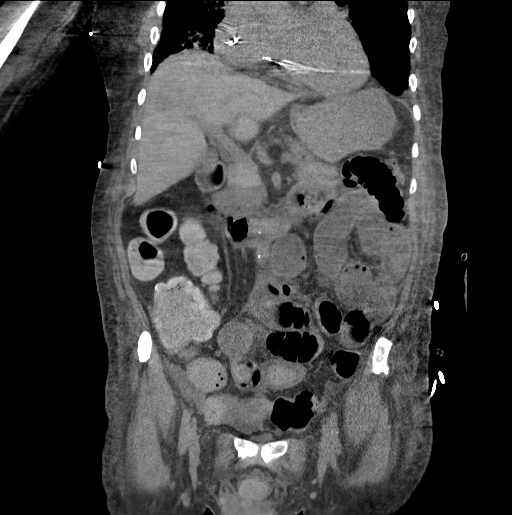
[im 92/166  soft-tissue]
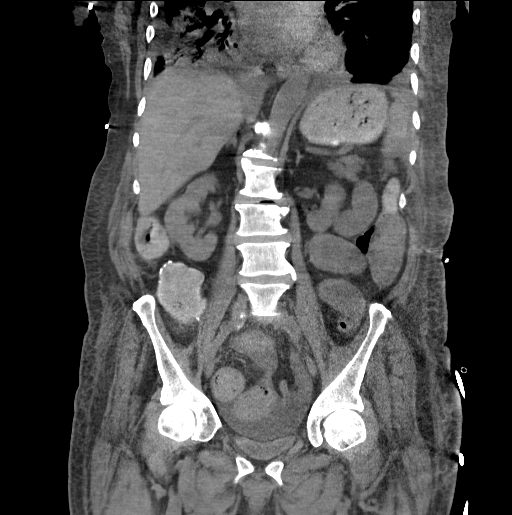

[16 of 46 positions shown; findings below may reference images not displayed]

FINDINGS: Lower chest: Moderate bilateral pleural effusions and compressive
atelectasis. Pacemaker partially included. Mild cardiomegaly.
Ground-glass opacity in the right middle lobe is partially included.

Hepatobiliary: No evidence of focal hepatic abnormality allowing for
lack of contrast. External artifact from overlying monitoring device
partially obscures evaluation of the gallbladder. No pericholecystic
inflammation. No biliary dilatation.

Pancreas: No ductal dilatation or inflammation.

Spleen: Normal in size without focal abnormality. Small amount of
perisplenic free fluid in the left upper quadrant.

Adrenals/Urinary Tract: Normal adrenal glands. No hydronephrosis. No
renal calculi. Bilateral renal cysts which appears similar to prior
exam. Urinary bladder is decompressed.

Stomach/Bowel: Gastrostomy tube appropriately positioned in the
stomach. Stomach is fluid-filled. No obvious gastric wall
thickening. Small bowel is diffusely dilated and fluid-filled. Small
bowel are dilated to the ileocolic junction. No decompressed small
bowel or transition point. Enteric sutures noted in the cecum. There
is ascending colonic tortuosity. Small amount of stool and air
within the ascending and transverse colon. More distal colon is
decompressed with small volume of stool. There is no colonic wall
thickening or inflammatory change. No abnormal rectal distention.

Vascular/Lymphatic: Aortic atherosclerosis. No aortic aneurysm. No
bulky abdominopelvic adenopathy.

Reproductive: Prostate is unremarkable.

Other: Small volume of abdominal ascites, with more moderate ascites
in pelvis. Fluid measures simple fluid density. There is no free
air. Mild generalized subcutaneous fat stranding/edema.

Musculoskeletal: Heterogeneous enlargement of right sartorius muscle
suspicious for hematoma. Multilevel degenerative change in the
spine. Sacral decubitus ulcer with air extending to bone. No obvious
bony destructive change. No abscess.
IMPRESSION: 1. Diffusely dilated and fluid-filled small bowel to the ileocolic
junction, without decompressed small bowel or transition point.
Findings favor generalized ileus rather than obstruction.
2. Small volume of abdominal ascites, with more moderate ascites in
the pelvis.
3. Moderate bilateral pleural effusions and compressive atelectasis.
Ground-glass opacity in the right middle lobe is partially included.
4. Sacral decubitus ulcer with air extending to bone. No bony
destructive change or focal fluid collection.
5. Heterogeneous enlargement of right sartorius muscle suspicious
for intramuscular hematoma.

Aortic Atherosclerosis ([id number]).
